# Patient Record
Sex: Male | Born: 1950 | Race: White | Hispanic: No | Marital: Married | Smoking: Never smoker
Health system: Southern US, Community
[De-identification: ages and names within clinical notes are randomized; demographics above are authoritative.]

## PROBLEM LIST (undated history)

## (undated) DIAGNOSIS — R339 Retention of urine, unspecified: Secondary | ICD-10-CM

## (undated) DIAGNOSIS — Z87442 Personal history of urinary calculi: Secondary | ICD-10-CM

## (undated) DIAGNOSIS — Z8679 Personal history of other diseases of the circulatory system: Secondary | ICD-10-CM

## (undated) DIAGNOSIS — N400 Enlarged prostate without lower urinary tract symptoms: Secondary | ICD-10-CM

## (undated) DIAGNOSIS — T7840XA Allergy, unspecified, initial encounter: Secondary | ICD-10-CM

## (undated) DIAGNOSIS — N390 Urinary tract infection, site not specified: Secondary | ICD-10-CM

## (undated) DIAGNOSIS — M1712 Unilateral primary osteoarthritis, left knee: Secondary | ICD-10-CM

## (undated) DIAGNOSIS — E039 Hypothyroidism, unspecified: Secondary | ICD-10-CM

## (undated) DIAGNOSIS — R011 Cardiac murmur, unspecified: Secondary | ICD-10-CM

## (undated) DIAGNOSIS — G4733 Obstructive sleep apnea (adult) (pediatric): Secondary | ICD-10-CM

## (undated) DIAGNOSIS — E079 Disorder of thyroid, unspecified: Secondary | ICD-10-CM

## (undated) DIAGNOSIS — E119 Type 2 diabetes mellitus without complications: Secondary | ICD-10-CM

## (undated) DIAGNOSIS — R7303 Prediabetes: Secondary | ICD-10-CM

## (undated) DIAGNOSIS — G4730 Sleep apnea, unspecified: Secondary | ICD-10-CM

## (undated) DIAGNOSIS — I10 Essential (primary) hypertension: Secondary | ICD-10-CM

## (undated) HISTORY — DX: Sleep apnea, unspecified: G47.30

## (undated) HISTORY — DX: Prediabetes: R73.03

## (undated) HISTORY — DX: Disorder of thyroid, unspecified: E07.9

## (undated) HISTORY — DX: Essential (primary) hypertension: I10

## (undated) HISTORY — PX: HERNIA REPAIR: SHX51

## (undated) HISTORY — DX: Type 2 diabetes mellitus without complications: E11.9

## (undated) HISTORY — DX: Allergy, unspecified, initial encounter: T78.40XA

---

## 1991-12-28 HISTORY — PX: KNEE SURGERY: SHX244

## 2006-06-01 ENCOUNTER — Ambulatory Visit: Payer: Self-pay | Admitting: Otolaryngology

## 2006-07-06 ENCOUNTER — Ambulatory Visit: Payer: Self-pay | Admitting: Internal Medicine

## 2008-04-12 ENCOUNTER — Ambulatory Visit: Payer: Self-pay | Admitting: Unknown Physician Specialty

## 2008-11-26 ENCOUNTER — Ambulatory Visit: Payer: Self-pay | Admitting: Oncology

## 2008-12-24 ENCOUNTER — Ambulatory Visit: Payer: Self-pay | Admitting: Oncology

## 2008-12-27 ENCOUNTER — Ambulatory Visit: Payer: Self-pay | Admitting: Oncology

## 2010-07-13 ENCOUNTER — Ambulatory Visit: Payer: Self-pay | Admitting: Otolaryngology

## 2010-07-21 ENCOUNTER — Ambulatory Visit: Payer: Self-pay | Admitting: Otolaryngology

## 2010-07-22 ENCOUNTER — Ambulatory Visit: Payer: Self-pay | Admitting: Otolaryngology

## 2010-08-11 ENCOUNTER — Ambulatory Visit: Payer: Self-pay | Admitting: Otolaryngology

## 2010-08-20 ENCOUNTER — Ambulatory Visit: Payer: Self-pay | Admitting: Otolaryngology

## 2011-04-06 ENCOUNTER — Ambulatory Visit: Payer: Self-pay | Admitting: Otolaryngology

## 2013-05-25 ENCOUNTER — Ambulatory Visit: Payer: Self-pay | Admitting: Unknown Physician Specialty

## 2013-12-27 HISTORY — PX: THYROID SURGERY: SHX805

## 2014-02-25 DIAGNOSIS — D239 Other benign neoplasm of skin, unspecified: Secondary | ICD-10-CM

## 2014-02-25 HISTORY — DX: Other benign neoplasm of skin, unspecified: D23.9

## 2014-05-10 ENCOUNTER — Ambulatory Visit: Payer: Self-pay | Admitting: Otolaryngology

## 2014-07-15 ENCOUNTER — Ambulatory Visit: Payer: Self-pay | Admitting: Otolaryngology

## 2014-07-15 HISTORY — PX: TOTAL THYROIDECTOMY: SHX2547

## 2014-07-16 LAB — CALCIUM: CALCIUM: 8.6 mg/dL (ref 8.5–10.1)

## 2014-07-18 LAB — PATHOLOGY REPORT

## 2014-08-21 IMAGING — US THYROID ULTRASOUND
2 series · 14 of 25 positions shown · non-contrast
Comparison: 04/06/2011 and earlier studies

CLINICAL DATA: Thyroid nodule

EXAM:
THYROID ULTRASOUND
TECHNIQUE: Ultrasound examination of the thyroid gland and adjacent soft
tissues was performed.

[Series 1: thyroid ultrasound · 0.08mm/px · 13 of 67 slices shown (1 of 2)]
[im 1/67]
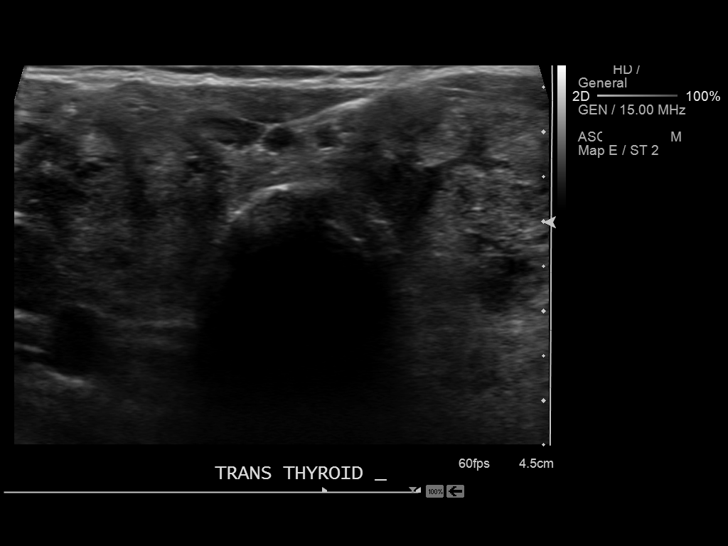
[im 6/67]
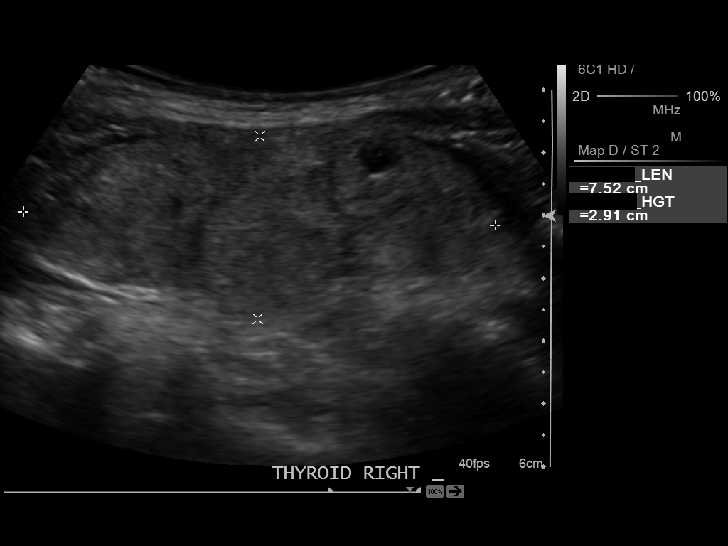
[im 12/67]
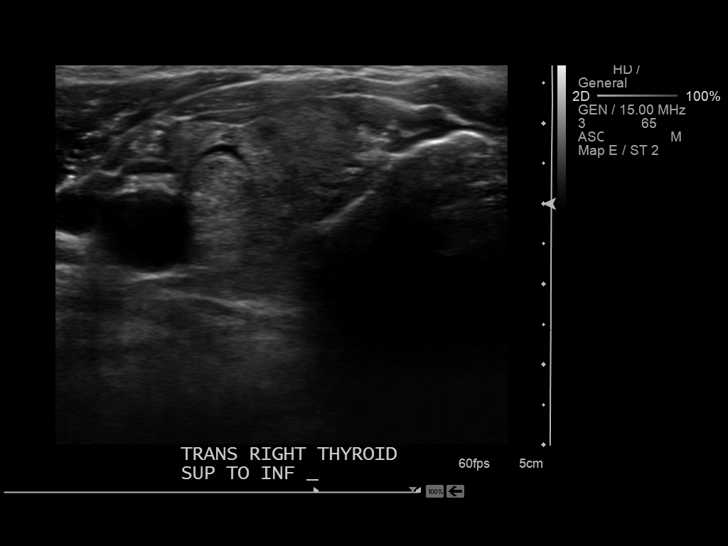
[im 18/67]
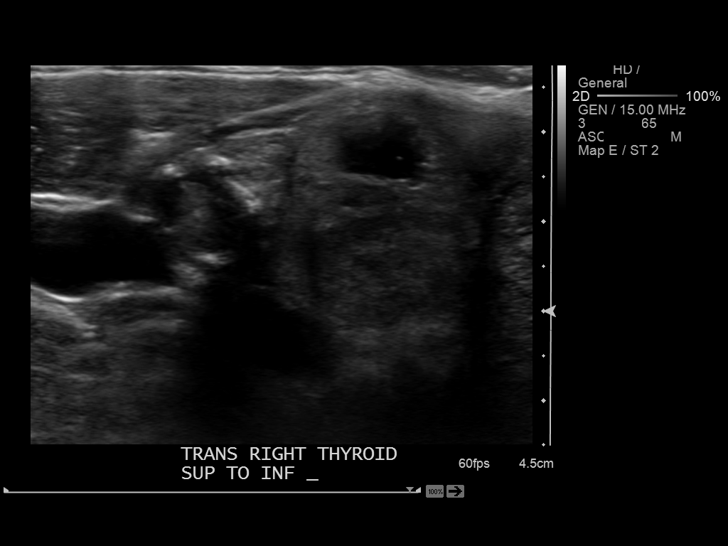
[im 23/67]
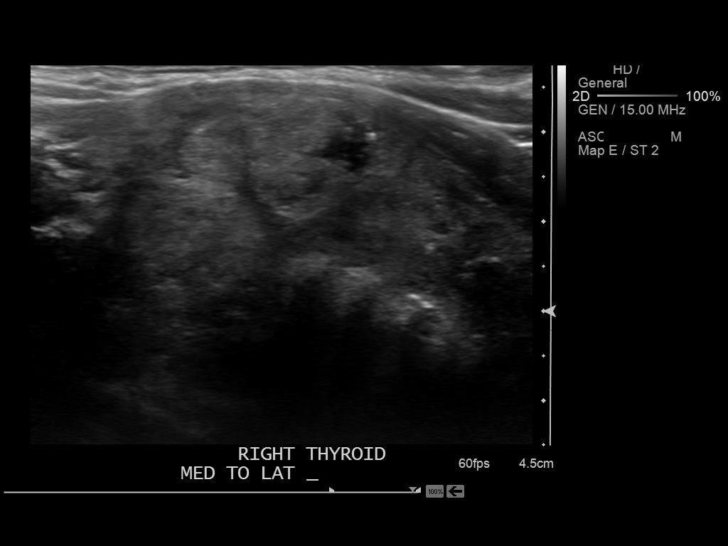
[im 26/67]
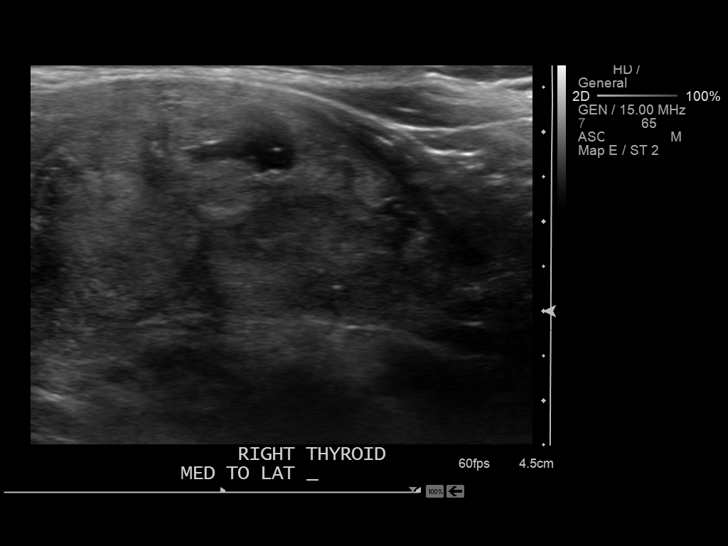
[im 32/67]
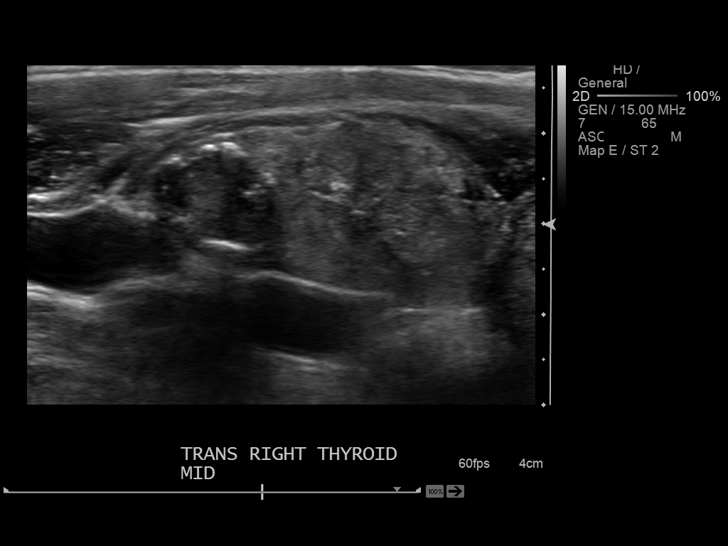
[im 38/67]
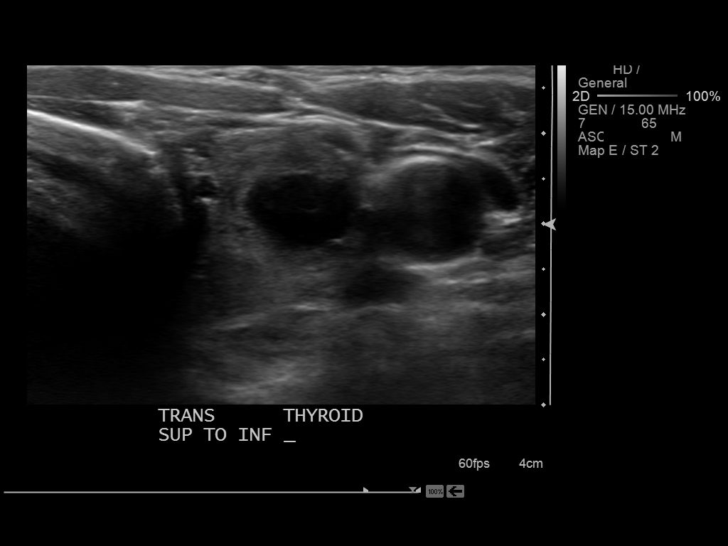
[im 44/67]
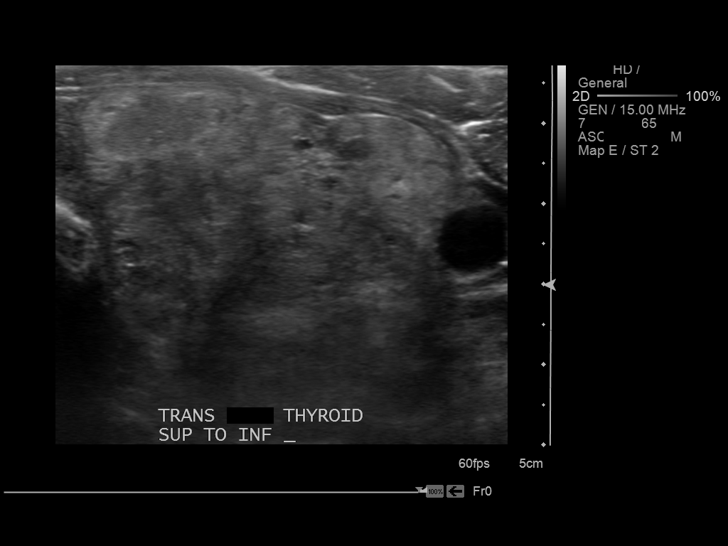
[im 46/67]
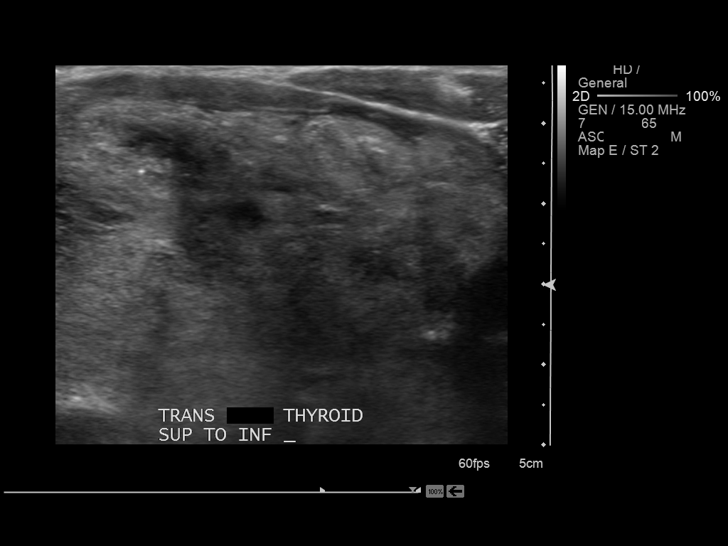
[im 52/67]
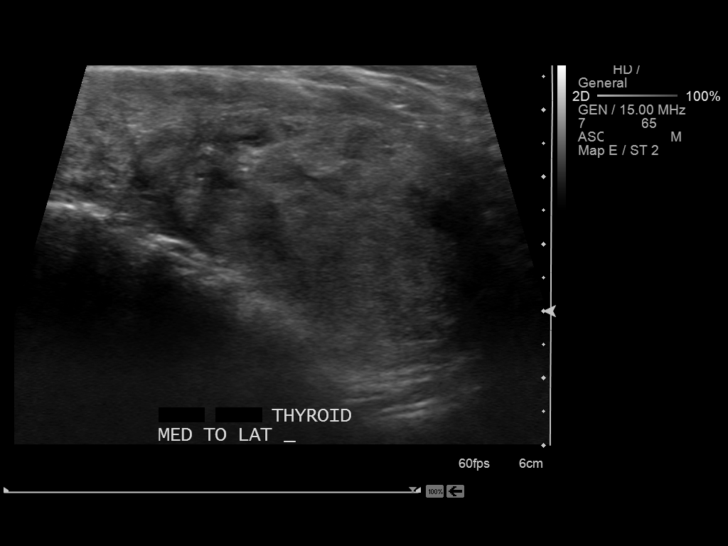
[im 58/67]
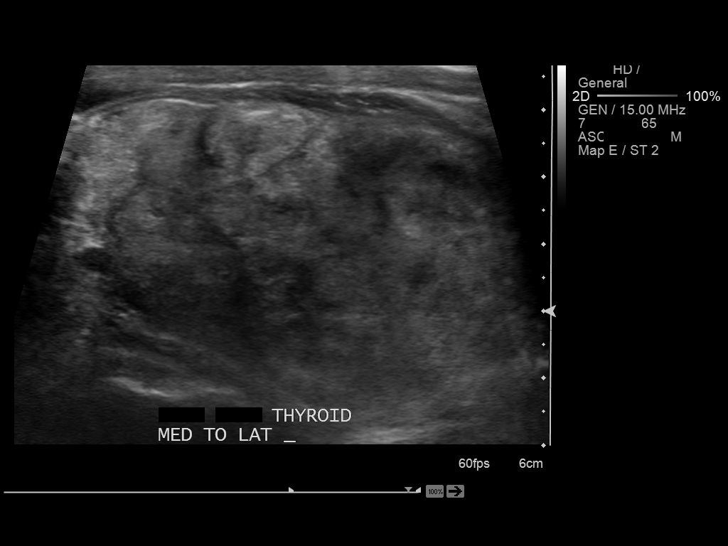
[im 64/67]
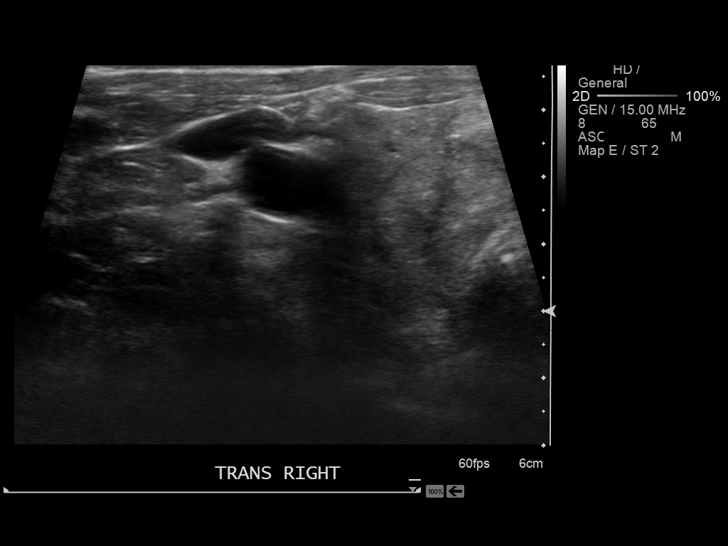

[Series 2: thyroid ultrasound · 0.08mm/px · 1 of 1 slices shown (2 of 2)]
[im 1/1]
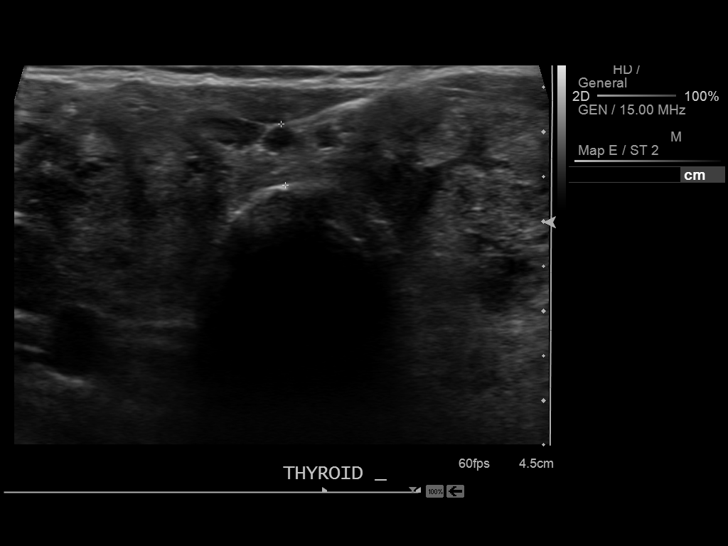

[14 of 25 positions shown; findings below may reference images not displayed]

FINDINGS: Right thyroid lobe

Measurements: 75 x 29 x 31 mm. Heterogeneous nodular echotexture.
There is a peripherally calcified 15 x 12 x 16 mm nodule, mid lobe
(previously 13 x 15 x 11).

Left thyroid lobe

Measurements: 93 x 41 x 45 mm. Heterogeneous echotexture without
discrete nodule. Focal mid lobe calcification.

Isthmus

Thickness: 7 mm.  No nodules visualized.

Lymphadenopathy

None visualized.
IMPRESSION: Thyromegaly with some increase in size of Single discrete nodule on
the right. Findings meet consensus criteria for biopsy.
Ultrasound-guided fine needle aspiration should be considered, as
per the consensus statement: Management of Thyroid Nodules Detected
at US: Society of Radiologists in Ultrasound Consensus Conference

## 2014-11-08 ENCOUNTER — Emergency Department: Payer: Self-pay | Admitting: Emergency Medicine

## 2014-11-08 LAB — BASIC METABOLIC PANEL
Anion Gap: 11 (ref 7–16)
BUN: 18 mg/dL (ref 7–18)
CREATININE: 1.01 mg/dL (ref 0.60–1.30)
Calcium, Total: 8.5 mg/dL (ref 8.5–10.1)
Chloride: 104 mmol/L (ref 98–107)
Co2: 26 mmol/L (ref 21–32)
EGFR (African American): >60
EGFR (Non-African Amer.): >60
GLUCOSE: 141 mg/dL — AB (ref 65–99)
Osmolality: 286 (ref 275–301)
Potassium: 3.7 mmol/L (ref 3.5–5.1)
SODIUM: 141 mmol/L (ref 136–145)

## 2014-11-08 LAB — CBC
HCT: 48 % (ref 40.0–52.0)
HGB: 16.4 g/dL (ref 13.0–18.0)
MCH: 31.6 pg (ref 26.0–34.0)
MCHC: 34.2 g/dL (ref 32.0–36.0)
MCV: 92 fL (ref 80–100)
PLATELETS: 204 10*3/uL (ref 150–440)
RBC: 5.2 10*6/uL (ref 4.40–5.90)
RDW: 13.6 % (ref 11.5–14.5)
WBC: 14.9 10*3/uL — ABNORMAL HIGH (ref 3.8–10.6)

## 2015-04-19 NOTE — Op Note (Signed)
PATIENT NAME:  Alan Holt, Alan Holt MR#:  [id number] DATE OF BIRTH:  [birth year]  DATE OF PROCEDURE:  07/15/2014  PREOPERATIVE DIAGNOSIS: Multiple enlarging thyroid nodules bilaterally.   POSTOPERATIVE DIAGNOSIS: Multiple enlarging thyroid nodules bilaterally.  OPERATIVE PROCEDURE: Total thyroidectomy.   SURGEON: Margaretha Sheffield, MD  ASSISTANT: Carloyn Manner, MD  ANESTHESIA: General.  COMPLICATIONS: None.   TOTAL ESTIMATED BLOOD LOSS: 100.  DESCRIPTION OF PROCEDURE: The patient was given general anesthesia by oral endotracheal intubation. A special oral endotracheal tube was used for monitoring of the laryngeal muscles during surgery. This was placed under direct visualization. A shoulder roll was placed and the neck was extended some. The patient had an upper crease and a crease down at the clavicles. The midportion was marked and 8 mL of 1% Xylocaine with epi 1:100,000 was placed into the subcutaneous tissues. The skin was prepped and draped in sterile fashion.   Incision was created following the skin crease line and then the skin and subcu were incised. The platysma was incised as well. Bleeding was controlled with electrocautery. The strap muscles were divided in the midline and elevated over the left thyroid gland, which was markedly enlarged. There appeared to be a very large nodule here that had a smooth capsule and was not very adherent. This was freed up mostly bluntly. There were some inferior vessels that were freed as well. Superiorly the attachments were starting to free up and then you could see what appeared to be more of a nodular gland further beneath this one big nodule that had further anterior attachments. Anterior attachments were cut with the Harmonic removing the superior thyroid vessels. It appeared as though there was a parathyroid gland here that I tried to free up from the gland and leave in the bed. Once this was freed up some the entire gland was delivered from the wound  and was approximately 5 inches in length. The inferior medial attachments were still there. Careful blunt dissection was done to find the recurrent laryngeal nerve. This was tracked up and attachments were freed up above it and below it to free up the gland from the trachea with sparing of the recurrent laryngeal nerve. The attachments to Berry's ligament were freed up and the entire gland was then freed up on the left side. There nerve was intact. There was no significant bleeding. Gauze was placed here temporarily while the right side was addressed.   Strap muscles were again elevated over the thyroid gland and lateral attachments were freed and there was a lateral vein on this side that was cut with the Harmonic. The remaining lateral and inferior attachments were freed up superiorly. The attachments were  freed as well. The superior thyroid vessels were cut across with the Harmonic also. The gland was then rotated medially to get underneath the recurrent laryngeal nerve and it was again found lying deep in the bed and the inferior attachments to the trachea were freed up. With the nerve in visualization the superior attachments and Berry's ligament were then cut across to free up the entire gland from the wound. The gland was tagged putting a small stitch at the right superior pole and a longer stitch at the left superior pole. This was sent to pathology in permanent solution.   The wounds were copiously irrigated. There was no significant bleeding on either side. The nerves were easily visualized and these were restimulated to show that there was good mobility. Surgicel was then cut in half and 1 piece  put over a bed on each side. Then 10 mm TLS drains were then placed bilaterally crisscrossing the midline. Once these were in the dural hooks were removed and the wound was allowed to close down. The shoulder roll was removed. The strap muscles were loosely placed together and then the platysmal layer was  closed with 4-0 Vicryls. The dermis and subcuticular was then closed with 4-0 Vicryl as well and then the skin edges were held in apposition using a 6-0 nylon in a running locking suture. The wound was covered with bacitracin, Telfa and then Tegaderm. The drains were placed to low continuous Vacutainer suction and were taped to the skin. The patient tolerated the procedure well. He was awakened and taken to the recovery room in satisfactory condition. There were no operative complications. Total estimated blood loss was 100 mL.   ____________________________ Huey Romans, MD phj:sb D: 07/15/2014 11:43:20 ET T: 07/15/2014 12:00:13 ET JOB#: [id number]  cc: Huey Romans, MD, <Dictator> Huey Romans MD ELECTRONICALLY SIGNED 07/18/2014 7:53

## 2015-05-28 DIAGNOSIS — E890 Postprocedural hypothyroidism: Secondary | ICD-10-CM | POA: Diagnosis not present

## 2015-07-09 ENCOUNTER — Encounter: Payer: Self-pay | Admitting: Family Medicine

## 2015-07-09 ENCOUNTER — Ambulatory Visit (INDEPENDENT_AMBULATORY_CARE_PROVIDER_SITE_OTHER): Payer: Medicare Other | Admitting: Family Medicine

## 2015-07-09 VITALS — BP 129/85 | HR 83 | Temp 98.4°F | Resp 18 | Ht 73.0 in | Wt 236.9 lb

## 2015-07-09 DIAGNOSIS — E039 Hypothyroidism, unspecified: Secondary | ICD-10-CM | POA: Insufficient documentation

## 2015-07-09 DIAGNOSIS — R748 Abnormal levels of other serum enzymes: Secondary | ICD-10-CM

## 2015-07-09 MED ORDER — NIACIN ER (ANTIHYPERLIPIDEMIC) 500 MG PO TBCR: 1500.0000 mg | EXTENDED_RELEASE_TABLET | Freq: Every day | ORAL | Status: DC

## 2015-07-09 NOTE — Progress Notes (Signed)
Name: Alan Holt   MRN: [id number]    DOB: 21-Apr-1951   Date:07/09/2015       Progress Note  Subjective  Chief Complaint  Chief Complaint  Patient presents with  . Establish Care    NP (Dr. Liana Gerold)  . Medication Refill    HPI Pt. Is here for follow up of low-HDL. He is on Niaspan 1500mg  daily. He has been on Niaspan for 16 years. Last HDL level in June 2015 was 38. Total cholesterol and LDL have been within normal limits. Pt. Has no history of heart disease.   Past Medical History  Diagnosis Date  . Allergy   . Sleep apnea   . Thyroid disease     Past Surgical History  Procedure Laterality Date  . Knee surgery Left 1993  . Thyroid surgery  2015  . Hernia repair      childhood  . Total thyroidectomy Bilateral 07/15/2014    Dr. Kathyrn Sheriff.     Family History  Problem Relation Age of Onset  . Diabetes Mother   . Alcohol abuse Father   . Stroke Father   . Diabetes Brother   . Heart attack Brother     History   Social History  . Marital Status: Married    Spouse Name: N/A  . Number of Children: N/A  . Years of Education: N/A   Occupational History  . Not on file.   Social History Main Topics  . Smoking status: Never Smoker   . Smokeless tobacco: Never Used  . Alcohol Use: No  . Drug Use: No  . Sexual Activity: No   Other Topics Concern  . Not on file   Social History Narrative  . No narrative on file     Current outpatient prescriptions:  .  ibuprofen (ADVIL,MOTRIN) 600 MG tablet, Take by mouth., Disp: , Rfl:  .  niacin (NIASPAN) 500 MG CR tablet, Take 3 tablets by mouth daily., Disp: , Rfl:  .  SYNTHROID 75 MCG tablet, , Disp: , Rfl:  .  SYNTHROID 88 MCG tablet, , Disp: , Rfl:  .  levothyroxine (SYNTHROID, LEVOTHROID) 175 MCG tablet, Take 175 mcg by mouth daily before breakfast., Disp: , Rfl:   Allergies  Allergen Reactions  . Erythromycin Rash     Review of Systems  Respiratory: Negative for shortness of breath.   Cardiovascular:  Negative for chest pain and palpitations.  Gastrointestinal: Negative for abdominal pain, diarrhea and constipation.  Musculoskeletal: Negative for myalgias, back pain and joint pain.      Objective  Filed Vitals:   07/09/15 1407  BP: 129/85  Pulse: 83  Temp: 98.4 F (36.9 C)  TempSrc: Oral  Resp: 18  Height: 6\' 1"  (1.854 m)  Weight: 236 lb 14.4 oz (107.457 kg)  SpO2: 96%    Physical Exam  Constitutional: He is oriented to person, place, and time and well-developed, well-nourished, and in no distress.  Cardiovascular: Normal rate and regular rhythm.   Pulmonary/Chest: Effort normal and breath sounds normal.  Abdominal: Soft. Bowel sounds are normal.  Neurological: He is alert and oriented to person, place, and time.  Skin: Skin is warm and dry.  Psychiatric: Affect normal.  Nursing note and vitals reviewed.    Assessment & Plan 1. Low serum HDL  - Lipid Profile - niacin (NIASPAN) 500 MG CR tablet; Take 3 tablets (1,500 mg total) by mouth at bedtime.  Dispense: 90 tablet; Refill: 5 - Basic Metabolic Panel (BMET) - Hepatic function  panel   Traevon Meiring Asad A. [facility] 07/09/2015 2:59 PM

## 2015-07-10 DIAGNOSIS — R748 Abnormal levels of other serum enzymes: Secondary | ICD-10-CM | POA: Diagnosis not present

## 2015-07-11 LAB — HEPATIC FUNCTION PANEL
ALK PHOS: 77 IU/L (ref 39–117)
ALT: 37 IU/L (ref 0–44)
AST: 29 IU/L (ref 0–40)
Albumin: 4.4 g/dL (ref 3.6–4.8)
Bilirubin Total: 0.8 mg/dL (ref 0.0–1.2)
Bilirubin, Direct: 0.18 mg/dL (ref 0.00–0.40)
Total Protein: 6.5 g/dL (ref 6.0–8.5)

## 2015-07-11 LAB — BASIC METABOLIC PANEL
BUN/Creatinine Ratio: 16 (ref 10–22)
BUN: 16 mg/dL (ref 8–27)
CALCIUM: 9.5 mg/dL (ref 8.6–10.2)
CHLORIDE: 103 mmol/L (ref 97–108)
CO2: 21 mmol/L (ref 18–29)
CREATININE: 0.99 mg/dL (ref 0.76–1.27)
GFR calc Af Amer: 92 mL/min/{1.73_m2} (ref >59)
GFR calc non Af Amer: 80 mL/min/{1.73_m2} (ref >59)
Glucose: 97 mg/dL (ref 65–99)
Potassium: 4.4 mmol/L (ref 3.5–5.2)
Sodium: 144 mmol/L (ref 134–144)

## 2015-07-11 LAB — LIPID PANEL
CHOL/HDL RATIO: 4.8 ratio (ref 0.0–5.0)
CHOLESTEROL TOTAL: 153 mg/dL (ref 100–199)
HDL: 32 mg/dL — AB (ref >39)
LDL Calculated: 62 mg/dL (ref 0–99)
TRIGLYCERIDES: 297 mg/dL — AB (ref 0–149)
VLDL CHOLESTEROL CAL: 59 mg/dL — AB (ref 5–40)

## 2015-07-16 ENCOUNTER — Encounter: Payer: Self-pay | Admitting: Family Medicine

## 2015-07-16 ENCOUNTER — Ambulatory Visit (INDEPENDENT_AMBULATORY_CARE_PROVIDER_SITE_OTHER): Payer: Medicare Other | Admitting: Family Medicine

## 2015-07-16 VITALS — BP 129/70 | HR 90 | Temp 98.0°F | Resp 17 | Ht 73.0 in | Wt 234.5 lb

## 2015-07-16 DIAGNOSIS — R748 Abnormal levels of other serum enzymes: Secondary | ICD-10-CM

## 2015-07-16 MED ORDER — NIACIN ER (ANTIHYPERLIPIDEMIC) 500 MG PO TBCR: 1500.0000 mg | EXTENDED_RELEASE_TABLET | Freq: Every day | ORAL | Status: DC

## 2015-07-16 NOTE — Progress Notes (Signed)
Name: Alan Holt   MRN: [id number]    DOB: 10/21/1951   Date:07/16/2015       Progress Note  Subjective  Chief Complaint  Chief Complaint  Patient presents with  . Follow-up    Discuss Labs  . Medication Refill    niacin    Hyperlipidemia This is a recurrent problem. Recent lipid tests were reviewed and are high (TG elevated, HDL is below normal.). Pertinent negatives include no chest pain or shortness of breath. Current antihyperlipidemic treatment includes diet change and exercise. Risk factors for coronary artery disease include dyslipidemia, male sex, family history and stress.     Past Medical History  Diagnosis Date  . Allergy   . Sleep apnea   . Thyroid disease     Past Surgical History  Procedure Laterality Date  . Knee surgery Left 1993  . Thyroid surgery  2015  . Hernia repair      childhood  . Total thyroidectomy Bilateral 07/15/2014    Dr. Kathyrn Sheriff.     Family History  Problem Relation Age of Onset  . Diabetes Mother   . Alcohol abuse Father   . Stroke Father   . Diabetes Brother   . Heart attack Brother     History   Social History  . Marital Status: Married    Spouse Name: N/A  . Number of Children: N/A  . Years of Education: N/A   Occupational History  . Not on file.   Social History Main Topics  . Smoking status: Never Smoker   . Smokeless tobacco: Never Used  . Alcohol Use: No  . Drug Use: No  . Sexual Activity: No   Other Topics Concern  . Not on file   Social History Narrative     Current outpatient prescriptions:  .  aspirin 81 MG tablet, Take 81 mg by mouth daily., Disp: , Rfl:  .  niacin (NIASPAN) 500 MG CR tablet, Take 3 tablets (1,500 mg total) by mouth at bedtime., Disp: 90 tablet, Rfl: 5 .  SYNTHROID 75 MCG tablet, , Disp: , Rfl:  .  SYNTHROID 88 MCG tablet, , Disp: , Rfl:  .  ibuprofen (ADVIL,MOTRIN) 600 MG tablet, Take by mouth., Disp: , Rfl:   Allergies  Allergen Reactions  . Erythromycin Rash     Review  of Systems  Respiratory: Negative for shortness of breath.   Cardiovascular: Negative for chest pain.      Objective  Filed Vitals:   07/16/15 1507  BP: 129/70  Pulse: 90  Temp: 98 F (36.7 C)  TempSrc: Oral  Resp: 17  Height: 6\' 1"  (1.854 m)  Weight: 234 lb 8 oz (106.369 kg)  SpO2: 98%    Physical Exam  Constitutional: He is well-developed, well-nourished, and in no distress.  Nursing note and vitals reviewed.      Recent Results (from the past 2160 hour(s))  Basic Metabolic Panel (BMET)     Status: None   Collection Time: 07/10/15  8:32 AM  Result Value Ref Range   Glucose 97 65 - 99 mg/dL    Comment: Specimen received in contact with cells. No visible hemolysis present. However GLUC may be decreased and K increased. Clinical correlation indicated.    BUN 16 8 - 27 mg/dL   Creatinine, Ser 0.99 0.76 - 1.27 mg/dL   GFR calc non Af Amer 80 >59 mL/min/1.73   GFR calc Af Amer 92 >59 mL/min/1.73   BUN/Creatinine Ratio 16 10 - 22  Sodium 144 134 - 144 mmol/L   Potassium 4.4 3.5 - 5.2 mmol/L   Chloride 103 97 - 108 mmol/L   CO2 21 18 - 29 mmol/L   Calcium 9.5 8.6 - 10.2 mg/dL  Hepatic function panel     Status: None   Collection Time: 07/10/15  8:32 AM  Result Value Ref Range   Total Protein 6.5 6.0 - 8.5 g/dL   Albumin 4.4 3.6 - 4.8 g/dL   Bilirubin Total 0.8 0.0 - 1.2 mg/dL   Bilirubin, Direct 0.18 0.00 - 0.40 mg/dL   Alkaline Phosphatase 77 39 - 117 IU/L   AST 29 0 - 40 IU/L   ALT 37 0 - 44 IU/L  Lipid panel     Status: Abnormal   Collection Time: 07/10/15  8:32 AM  Result Value Ref Range   Cholesterol, Total 153 100 - 199 mg/dL   Triglycerides 297 (H) 0 - 149 mg/dL   HDL 32 (L) >39 mg/dL    Comment: According to ATP-III Guidelines, HDL-C >59 mg/dL is considered a negative risk factor for CHD.    VLDL Cholesterol Cal 59 (H) 5 - 40 mg/dL   LDL Calculated 62 0 - 99 mg/dL   Chol/HDL Ratio 4.8 0.0 - 5.0 ratio units    Comment:                                    T. Chol/HDL Ratio                                             Men  Women                               1/2 Avg.Risk  3.4    3.3                                   Avg.Risk  5.0    4.4                                2X Avg.Risk  9.6    7.1                                3X Avg.Risk 23.4   11.0      Assessment & Plan 1. Low serum HDL - niacin (NIASPAN) 500 MG CR tablet; Take 3 tablets (1,500 mg total) by mouth at bedtime.  Dispense: 270 tablet; Refill: 0 Pt. Does not wish to be started on a statin at this time. He will work on dietary and lifestyle therapy and recheck in 3 months  Annel Zunker Asad A. [facility] 07/16/2015 3:28 PM

## 2015-12-04 DIAGNOSIS — E890 Postprocedural hypothyroidism: Secondary | ICD-10-CM | POA: Diagnosis not present

## 2015-12-09 DIAGNOSIS — E890 Postprocedural hypothyroidism: Secondary | ICD-10-CM | POA: Diagnosis not present

## 2016-01-07 ENCOUNTER — Telehealth: Payer: Self-pay | Admitting: Family Medicine

## 2016-01-07 NOTE — Telephone Encounter (Signed)
Patient has appointment for this coming Monday and would like to pick up a lab order in the morning. He would like to order cholesterol, triglycerides and he said check his chart for any other test that dr Manuella Ghazi normally order

## 2016-01-08 ENCOUNTER — Other Ambulatory Visit: Payer: Self-pay | Admitting: Family Medicine

## 2016-01-08 DIAGNOSIS — E785 Hyperlipidemia, unspecified: Secondary | ICD-10-CM

## 2016-01-08 NOTE — Telephone Encounter (Signed)
Routed to Dr. Shah for advice  

## 2016-01-08 NOTE — Telephone Encounter (Signed)
Pt. Will need fasting Lipid Panel and Comprehensive Metabolic Panel.

## 2016-01-12 ENCOUNTER — Encounter: Payer: Self-pay | Admitting: Family Medicine

## 2016-01-12 ENCOUNTER — Ambulatory Visit (INDEPENDENT_AMBULATORY_CARE_PROVIDER_SITE_OTHER): Payer: Medicare Other | Admitting: Family Medicine

## 2016-01-12 VITALS — BP 128/71 | HR 86 | Temp 98.6°F | Resp 18 | Ht 73.0 in | Wt 233.2 lb

## 2016-01-12 DIAGNOSIS — E785 Hyperlipidemia, unspecified: Secondary | ICD-10-CM | POA: Diagnosis not present

## 2016-01-12 DIAGNOSIS — E781 Pure hyperglyceridemia: Secondary | ICD-10-CM

## 2016-01-12 DIAGNOSIS — R748 Abnormal levels of other serum enzymes: Secondary | ICD-10-CM

## 2016-01-12 MED ORDER — NIACIN ER (ANTIHYPERLIPIDEMIC) 500 MG PO TBCR: 1500.0000 mg | EXTENDED_RELEASE_TABLET | Freq: Every day | ORAL | Status: DC

## 2016-01-12 NOTE — Progress Notes (Signed)
Name: Alan Holt   MRN: [id number]    DOB: [birth year]   Date:01/12/2016       Progress Note  Subjective  Chief Complaint  Chief Complaint  Patient presents with  . Follow-up    6 mo    Hyperlipidemia This is a chronic problem. The problem is uncontrolled. Recent lipid tests were reviewed and are low (HDL below normal, TG above normal). Pertinent negatives include no chest pain, leg pain, myalgias or shortness of breath. Current antihyperlipidemic treatment includes nicotinic acid. The current treatment provides moderate improvement of lipids.    Past Medical History  Diagnosis Date  . Allergy   . Sleep apnea   . Thyroid disease     Past Surgical History  Procedure Laterality Date  . Knee surgery Left 1993  . Thyroid surgery  2015  . Hernia repair      childhood  . Total thyroidectomy Bilateral 07/15/2014    Dr. Kathyrn Sheriff.     Family History  Problem Relation Age of Onset  . Diabetes Mother   . Alcohol abuse Father   . Stroke Father   . Diabetes Brother   . Heart attack Brother     Social History   Social History  . Marital Status: Married    Spouse Name: N/A  . Number of Children: N/A  . Years of Education: N/A   Occupational History  . Not on file.   Social History Main Topics  . Smoking status: Never Smoker   . Smokeless tobacco: Never Used  . Alcohol Use: No  . Drug Use: No  . Sexual Activity: No   Other Topics Concern  . Not on file   Social History Narrative     Current outpatient prescriptions:  .  aspirin 81 MG tablet, Take 81 mg by mouth daily., Disp: , Rfl:  .  ibuprofen (ADVIL,MOTRIN) 600 MG tablet, Take by mouth., Disp: , Rfl:  .  niacin (NIASPAN) 500 MG CR tablet, Take 3 tablets (1,500 mg total) by mouth at bedtime., Disp: 270 tablet, Rfl: 0 .  SYNTHROID 75 MCG tablet, , Disp: , Rfl:  .  SYNTHROID 88 MCG tablet, , Disp: , Rfl:   Allergies  Allergen Reactions  . Erythromycin Rash     Review of Systems  Respiratory: Negative  for shortness of breath.   Cardiovascular: Negative for chest pain.  Musculoskeletal: Negative for myalgias.     Objective  Filed Vitals:   01/12/16 0809  BP: 128/71  Pulse: 86  Temp: 98.6 F (37 C)  TempSrc: Oral  Resp: 18  Height: 6\' 1"  (1.854 m)  Weight: 233 lb 3.2 oz (105.779 kg)  SpO2: 98%    Physical Exam  Constitutional: He is oriented to person, place, and time and well-developed, well-nourished, and in no distress.  Cardiovascular: Normal rate, regular rhythm and normal heart sounds.   Pulmonary/Chest: Effort normal and breath sounds normal.  Abdominal: Soft. Bowel sounds are normal.  Neurological: He is alert and oriented to person, place, and time.  Nursing note and vitals reviewed.     Assessment & Plan  1. Low serum HDL  - niacin (NIASPAN) 500 MG CR tablet; Take 3 tablets (1,500 mg total) by mouth at bedtime.  Dispense: 270 tablet; Refill: 0  2. Hypertriglyceridemia Repeat FLP today and consider medication adjustment.   Halsey Hammen Asad A. [facility] 01/12/2016 8:23 AM

## 2016-01-13 LAB — LIPID PANEL
CHOLESTEROL TOTAL: 151 mg/dL (ref 100–199)
Chol/HDL Ratio: 4.1 ratio units (ref 0.0–5.0)
HDL: 37 mg/dL — AB (ref >39)
LDL Calculated: 78 mg/dL (ref 0–99)
TRIGLYCERIDES: 180 mg/dL — AB (ref 0–149)
VLDL Cholesterol Cal: 36 mg/dL (ref 5–40)

## 2016-01-13 LAB — COMPREHENSIVE METABOLIC PANEL
ALBUMIN: 4.4 g/dL (ref 3.6–4.8)
ALK PHOS: 77 IU/L (ref 39–117)
ALT: 37 IU/L (ref 0–44)
AST: 28 IU/L (ref 0–40)
Albumin/Globulin Ratio: 2 (ref 1.1–2.5)
BUN/Creatinine Ratio: 17 (ref 10–22)
BUN: 20 mg/dL (ref 8–27)
Bilirubin Total: 0.6 mg/dL (ref 0.0–1.2)
CHLORIDE: 104 mmol/L (ref 96–106)
CO2: 24 mmol/L (ref 18–29)
CREATININE: 1.2 mg/dL (ref 0.76–1.27)
Calcium: 9.4 mg/dL (ref 8.6–10.2)
GFR calc non Af Amer: 63 mL/min/{1.73_m2} (ref >59)
GFR, EST AFRICAN AMERICAN: 73 mL/min/{1.73_m2} (ref >59)
GLUCOSE: 100 mg/dL — AB (ref 65–99)
Globulin, Total: 2.2 g/dL (ref 1.5–4.5)
POTASSIUM: 4.3 mmol/L (ref 3.5–5.2)
SODIUM: 143 mmol/L (ref 134–144)
Total Protein: 6.6 g/dL (ref 6.0–8.5)

## 2016-03-01 ENCOUNTER — Other Ambulatory Visit: Payer: Self-pay | Admitting: Family Medicine

## 2016-03-02 NOTE — Telephone Encounter (Signed)
Medication has been refilled and sent to [facility] °

## 2016-05-02 ENCOUNTER — Other Ambulatory Visit: Payer: Self-pay | Admitting: Family Medicine

## 2016-06-09 DIAGNOSIS — E890 Postprocedural hypothyroidism: Secondary | ICD-10-CM | POA: Diagnosis not present

## 2016-06-18 ENCOUNTER — Ambulatory Visit (INDEPENDENT_AMBULATORY_CARE_PROVIDER_SITE_OTHER): Payer: Medicare Other | Admitting: Family Medicine

## 2016-06-18 ENCOUNTER — Encounter: Payer: Self-pay | Admitting: Family Medicine

## 2016-06-18 VITALS — BP 128/76 | HR 105 | Temp 98.3°F | Resp 17 | Ht 73.0 in | Wt 239.7 lb

## 2016-06-18 DIAGNOSIS — R198 Other specified symptoms and signs involving the digestive system and abdomen: Secondary | ICD-10-CM

## 2016-06-18 NOTE — Progress Notes (Signed)
Name: Alan Holt   MRN: [id number]    DOB: 1951/12/22   Date:06/18/2016       Progress Note  Subjective  Chief Complaint  Chief Complaint  Patient presents with  . Acute Visit    Sline enlarged; fullness under rib cage     HPI  Pt. Presents for evaluation of possible enlarged spleen, started feeling fullness underneath the left rib cage 4 weeks ago, feels heaviness in the area. No pain, fever, chills, weight loss, fatigue. Area of greatest concern in just below the rib cage, sitting in one position makes it feel worse (driving and sitting in one position for a long period of time).  Past Medical History  Diagnosis Date  . Allergy   . Sleep apnea   . Thyroid disease     Past Surgical History  Procedure Laterality Date  . Knee surgery Left 1993  . Thyroid surgery  2015  . Hernia repair      childhood  . Total thyroidectomy Bilateral 07/15/2014    Dr. Kathyrn Sheriff.     Family History  Problem Relation Age of Onset  . Diabetes Mother   . Alcohol abuse Father   . Stroke Father   . Diabetes Brother   . Heart attack Brother     Social History   Social History  . Marital Status: Married    Spouse Name: N/A  . Number of Children: N/A  . Years of Education: N/A   Occupational History  . Not on file.   Social History Main Topics  . Smoking status: Never Smoker   . Smokeless tobacco: Never Used  . Alcohol Use: No  . Drug Use: No  . Sexual Activity: No   Other Topics Concern  . Not on file   Social History Narrative     Current outpatient prescriptions:  .  aspirin 81 MG tablet, Take 81 mg by mouth daily., Disp: , Rfl:  .  ibuprofen (ADVIL,MOTRIN) 600 MG tablet, Take by mouth., Disp: , Rfl:  .  niacin (NIASPAN) 500 MG CR tablet, Take 3 tablets by mouth at  bedtime, Disp: 270 tablet, Rfl: 3 .  SYNTHROID 75 MCG tablet, , Disp: , Rfl:  .  SYNTHROID 88 MCG tablet, , Disp: , Rfl:   Allergies  Allergen Reactions  . Erythromycin Rash    Review of Systems   Constitutional: Negative for fever, chills and malaise/fatigue.  Gastrointestinal: Negative for nausea, vomiting, abdominal pain and blood in stool.  Genitourinary: Negative for hematuria.    Objective  Filed Vitals:   06/18/16 1016  BP: 128/76  Pulse: 105  Temp: 98.3 F (36.8 C)  TempSrc: Oral  Resp: 17  Height: 6\' 1"  (1.854 m)  Weight: 239 lb 11.2 oz (108.727 kg)  SpO2: 96%    Physical Exam  Constitutional: He is oriented to person, place, and time and well-developed, well-nourished, and in no distress.  Cardiovascular: Normal rate, regular rhythm, S1 normal and S2 normal.   No murmur heard. Pulmonary/Chest: Effort normal. No respiratory distress. He has no decreased breath sounds. He has no rales.  Abdominal: Soft. Normal appearance and bowel sounds are normal. There is no splenomegaly. There is no tenderness.  Neurological: He is alert and oriented to person, place, and time.  Nursing note and vitals reviewed.    Assessment & Plan  1. LUQ fullness Unclear etiology, we'll obtain CBC and left upper quadrant ultrasound to evaluate for possible splenomegaly. - CBC with Differential - US Abdomen Limited;  Future   Babak Lucus Asad A. [facility] 06/18/2016 10:24 AM

## 2016-06-19 LAB — CBC WITH DIFFERENTIAL/PLATELET
BASOS: 0 %
Basophils Absolute: 0 10*3/uL (ref 0.0–0.2)
EOS (ABSOLUTE): 0.1 10*3/uL (ref 0.0–0.4)
EOS: 1 %
HEMATOCRIT: 45.3 % (ref 37.5–51.0)
Hemoglobin: 15.9 g/dL (ref 12.6–17.7)
Immature Grans (Abs): 0 10*3/uL (ref 0.0–0.1)
Immature Granulocytes: 0 %
LYMPHS ABS: 1.9 10*3/uL (ref 0.7–3.1)
Lymphs: 26 %
MCH: 31.7 pg (ref 26.6–33.0)
MCHC: 35.1 g/dL (ref 31.5–35.7)
MCV: 90 fL (ref 79–97)
MONOCYTES: 7 %
MONOS ABS: 0.5 10*3/uL (ref 0.1–0.9)
NEUTROS ABS: 4.8 10*3/uL (ref 1.4–7.0)
Neutrophils: 66 %
Platelets: 235 10*3/uL (ref 150–379)
RBC: 5.01 x10E6/uL (ref 4.14–5.80)
RDW: 13.5 % (ref 12.3–15.4)
WBC: 7.3 10*3/uL (ref 3.4–10.8)

## 2016-06-21 ENCOUNTER — Ambulatory Visit
Admission: RE | Admit: 2016-06-21 | Discharge: 2016-06-21 | Disposition: A | Discharge status: Home / Self Care | Payer: Medicare Other | Source: Ambulatory Visit | Attending: Family Medicine | Admitting: Family Medicine

## 2016-06-21 DIAGNOSIS — R198 Other specified symptoms and signs involving the digestive system and abdomen: Secondary | ICD-10-CM | POA: Diagnosis not present

## 2016-06-22 DIAGNOSIS — E890 Postprocedural hypothyroidism: Secondary | ICD-10-CM | POA: Diagnosis not present

## 2016-07-12 ENCOUNTER — Ambulatory Visit: Payer: Medicare Other | Admitting: Family Medicine

## 2016-07-13 ENCOUNTER — Ambulatory Visit: Payer: Medicare Other | Admitting: Family Medicine

## 2016-07-20 ENCOUNTER — Encounter: Payer: Self-pay | Admitting: Family Medicine

## 2016-07-20 ENCOUNTER — Ambulatory Visit (INDEPENDENT_AMBULATORY_CARE_PROVIDER_SITE_OTHER): Payer: Medicare Other | Admitting: Family Medicine

## 2016-07-20 VITALS — BP 124/82 | HR 97 | Temp 98.6°F | Resp 18 | Ht 73.0 in | Wt 233.8 lb

## 2016-07-20 DIAGNOSIS — R7989 Other specified abnormal findings of blood chemistry: Secondary | ICD-10-CM | POA: Insufficient documentation

## 2016-07-20 DIAGNOSIS — E291 Testicular hypofunction: Secondary | ICD-10-CM | POA: Diagnosis not present

## 2016-07-20 DIAGNOSIS — R748 Abnormal levels of other serum enzymes: Secondary | ICD-10-CM | POA: Diagnosis not present

## 2016-07-20 DIAGNOSIS — E781 Pure hyperglyceridemia: Secondary | ICD-10-CM

## 2016-07-20 LAB — LIPID PANEL
CHOLESTEROL: 155 mg/dL (ref 125–200)
HDL: 38 mg/dL — ABNORMAL LOW (ref >=40)
LDL Cholesterol: 68 mg/dL (ref <130)
TRIGLYCERIDES: 245 mg/dL — AB (ref <150)
Total CHOL/HDL Ratio: 4.1 Ratio (ref <=5.0)
VLDL: 49 mg/dL — AB (ref <30)

## 2016-07-20 LAB — COMPLETE METABOLIC PANEL WITH GFR
ALBUMIN: 4.8 g/dL (ref 3.6–5.1)
ALK PHOS: 80 U/L (ref 40–115)
ALT: 37 U/L (ref 9–46)
AST: 28 U/L (ref 10–35)
BUN: 20 mg/dL (ref 7–25)
CALCIUM: 9.9 mg/dL (ref 8.6–10.3)
CHLORIDE: 107 mmol/L (ref 98–110)
CO2: 25 mmol/L (ref 20–31)
CREATININE: 0.99 mg/dL (ref 0.70–1.25)
GFR, Est Non African American: 79 mL/min (ref >=60)
Glucose, Bld: 110 mg/dL — ABNORMAL HIGH (ref 65–99)
POTASSIUM: 4.2 mmol/L (ref 3.5–5.3)
Sodium: 143 mmol/L (ref 135–146)
Total Bilirubin: 0.7 mg/dL (ref 0.2–1.2)
Total Protein: 7.4 g/dL (ref 6.1–8.1)

## 2016-07-20 MED ORDER — NIACIN ER (ANTIHYPERLIPIDEMIC) 500 MG PO TBCR: 1500.0000 mg | EXTENDED_RELEASE_TABLET | Freq: Every day | ORAL | 3 refills | Status: DC

## 2016-07-20 NOTE — Progress Notes (Signed)
Name: Alan Holt   MRN: [id number]    DOB: 31-May-1951   Date:07/20/2016       Progress Note  Subjective  Chief Complaint  Chief Complaint  Patient presents with  . Hyperlipidemia    follow up    Hyperlipidemia  This is a chronic problem. The problem is uncontrolled (elevated Triglycerides, low HDL, normal LDL.). Pertinent negatives include no chest pain, leg pain or myalgias. Current antihyperlipidemic treatment includes nicotinic acid.    History of Low Testosterone: Pt. Reports that his former PCP Dr. Jacqualine Code was checking his testosterone levels periodically and the levels were declining and reportedly below normal. He was never started on any replacement therapy, does not feel fatigued or loss of libido.    Past Medical History:  Diagnosis Date  . Allergy   . Sleep apnea   . Thyroid disease     Past Surgical History:  Procedure Laterality Date  . HERNIA REPAIR     childhood  . KNEE SURGERY Left 1993  . THYROID SURGERY  2015  . TOTAL THYROIDECTOMY Bilateral 07/15/2014   Dr. Kathyrn Sheriff.     Family History  Problem Relation Age of Onset  . Diabetes Mother   . Alcohol abuse Father   . Stroke Father   . Diabetes Brother   . Heart attack Brother     Social History   Social History  . Marital status: Married    Spouse name: N/A  . Number of children: N/A  . Years of education: N/A   Occupational History  . Not on file.   Social History Main Topics  . Smoking status: Never Smoker  . Smokeless tobacco: Never Used  . Alcohol use No  . Drug use: No  . Sexual activity: No   Other Topics Concern  . Not on file   Social History Narrative  . No narrative on file     Current Outpatient Prescriptions:  .  aspirin 81 MG tablet, Take 81 mg by mouth daily., Disp: , Rfl:  .  ibuprofen (ADVIL,MOTRIN) 600 MG tablet, Take by mouth., Disp: , Rfl:  .  niacin (NIASPAN) 500 MG CR tablet, Take 3 tablets by mouth at  bedtime, Disp: 270 tablet, Rfl: 3 .  SYNTHROID 75 MCG  tablet, , Disp: , Rfl:  .  SYNTHROID 88 MCG tablet, , Disp: , Rfl:   Allergies  Allergen Reactions  . Erythromycin Rash     Review of Systems  Constitutional: Negative for malaise/fatigue.  Cardiovascular: Negative for chest pain.  Gastrointestinal: Negative for abdominal pain.  Musculoskeletal: Negative for myalgias.    Objective  Vitals:   07/20/16 0951  BP: 124/82  Pulse: 97  Resp: 18  Temp: 98.6 F (37 C)  TempSrc: Oral  SpO2: 97%  Weight: 233 lb 12.8 oz (106.1 kg)  Height: 6\' 1"  (1.854 m)    Physical Exam  Constitutional: He is oriented to person, place, and time and well-developed, well-nourished, and in no distress.  HENT:  Head: Normocephalic and atraumatic.  Cardiovascular: Normal rate, regular rhythm and normal heart sounds.   No murmur heard. Pulmonary/Chest: Effort normal and breath sounds normal. He has no wheezes.  Abdominal: Soft. Bowel sounds are normal. There is no tenderness.  Neurological: He is alert and oriented to person, place, and time.  Psychiatric: Mood, memory, affect and judgment normal.  Nursing note and vitals reviewed.     Assessment & Plan  1. Low serum HDL He is staying physically active (jogging), recheck  lipids. - COMPLETE METABOLIC PANEL WITH GFR - Lipid panel  2. Hypertriglyceridemia Continue on Niaspan three tablet at bedtime.  - niacin (NIASPAN) 500 MG CR tablet; Take 3 tablets (1,500 mg total) by mouth at bedtime.  Dispense: 270 tablet; Refill: 3  3. Low testosterone In the past, never on therapy, will recheck.  - Testosterone   Zaccheus Edmister Asad A. [facility] 07/20/2016 10:26 AM

## 2016-07-21 LAB — TESTOSTERONE: Testosterone: 272 ng/dL (ref 250–827)

## 2016-07-23 ENCOUNTER — Telehealth: Payer: Self-pay | Admitting: Emergency Medicine

## 2016-07-23 NOTE — Telephone Encounter (Signed)
Patient notified. Appointment made for follow up

## 2016-08-04 ENCOUNTER — Ambulatory Visit: Payer: Medicare Other | Admitting: Family Medicine

## 2016-08-10 ENCOUNTER — Ambulatory Visit (INDEPENDENT_AMBULATORY_CARE_PROVIDER_SITE_OTHER): Payer: Medicare Other | Admitting: Family Medicine

## 2016-08-10 ENCOUNTER — Encounter: Payer: Self-pay | Admitting: Family Medicine

## 2016-08-10 ENCOUNTER — Encounter (INDEPENDENT_AMBULATORY_CARE_PROVIDER_SITE_OTHER): Payer: Self-pay

## 2016-08-10 VITALS — BP 122/76 | HR 79 | Temp 97.8°F | Resp 16 | Ht 73.0 in | Wt 238.7 lb

## 2016-08-10 DIAGNOSIS — E785 Hyperlipidemia, unspecified: Secondary | ICD-10-CM

## 2016-08-10 DIAGNOSIS — E786 Lipoprotein deficiency: Secondary | ICD-10-CM | POA: Diagnosis not present

## 2016-08-10 DIAGNOSIS — E781 Pure hyperglyceridemia: Secondary | ICD-10-CM

## 2016-08-10 DIAGNOSIS — R739 Hyperglycemia, unspecified: Secondary | ICD-10-CM

## 2016-08-10 DIAGNOSIS — E782 Mixed hyperlipidemia: Secondary | ICD-10-CM | POA: Insufficient documentation

## 2016-08-10 LAB — POCT GLYCOSYLATED HEMOGLOBIN (HGB A1C): HEMOGLOBIN A1C: 5.5

## 2016-08-10 MED ORDER — ROSUVASTATIN CALCIUM 5 MG PO TABS: 5.0000 mg | ORAL_TABLET | Freq: Every day | ORAL | 0 refills | Status: DC

## 2016-08-10 NOTE — Progress Notes (Signed)
Name: Alan Holt   MRN: [id number]    DOB: [birth year]   Date:08/10/2016       Progress Note  Subjective  Chief Complaint  Chief Complaint  Patient presents with  . Follow-up    Discuss statin therapy    Hyperlipidemia  This is a chronic problem. The problem is uncontrolled. Recent lipid tests were reviewed and are high (Elevated triglycerides, low HDL). Pertinent negatives include no myalgias. Current antihyperlipidemic treatment includes nicotinic acid, diet change and exercise. The current treatment provides mild improvement of lipids. There are no compliance problems.  Risk factors for coronary artery disease include dyslipidemia and male sex.  10 year risk of CVD is estimated to be 12%, patient not on statin therapy   Past Medical History:  Diagnosis Date  . Allergy   . Sleep apnea   . Thyroid disease     Past Surgical History:  Procedure Laterality Date  . HERNIA REPAIR     childhood  . KNEE SURGERY Left 1993  . THYROID SURGERY  2015  . TOTAL THYROIDECTOMY Bilateral 07/15/2014   Dr. Kathyrn Sheriff.     Family History  Problem Relation Age of Onset  . Diabetes Mother   . Alcohol abuse Father   . Stroke Father   . Diabetes Brother   . Heart attack Brother     Social History   Social History  . Marital status: Married    Spouse name: N/A  . Number of children: N/A  . Years of education: N/A   Occupational History  . Not on file.   Social History Main Topics  . Smoking status: Never Smoker  . Smokeless tobacco: Never Used  . Alcohol use No  . Drug use: No  . Sexual activity: No   Other Topics Concern  . Not on file   Social History Narrative  . No narrative on file     Current Outpatient Prescriptions:  .  aspirin 81 MG tablet, Take 81 mg by mouth daily., Disp: , Rfl:  .  ibuprofen (ADVIL,MOTRIN) 600 MG tablet, Take by mouth., Disp: , Rfl:  .  niacin (NIASPAN) 500 MG CR tablet, Take 3 tablets (1,500 mg total) by mouth at bedtime., Disp: 270 tablet,  Rfl: 3 .  SYNTHROID 75 MCG tablet, , Disp: , Rfl:  .  SYNTHROID 88 MCG tablet, , Disp: , Rfl:   Allergies  Allergen Reactions  . Erythromycin Rash     Review of Systems  Constitutional: Negative for chills and fever.  Gastrointestinal: Negative for abdominal pain.  Musculoskeletal: Negative for myalgias.      Objective  Vitals:   08/10/16 1021  BP: 122/76  Pulse: 79  Resp: 16  Temp: 97.8 F (36.6 C)  TempSrc: Oral  SpO2: 97%  Weight: 238 lb 11.2 oz (108.3 kg)  Height: 6\' 1"  (1.854 m)    Physical Exam  Constitutional: He is oriented to person, place, and time and well-developed, well-nourished, and in no distress.  Cardiovascular: Normal rate, regular rhythm and normal heart sounds.   No murmur heard. Pulmonary/Chest: Effort normal and breath sounds normal. He has no wheezes. He has no rales.  Neurological: He is alert and oriented to person, place, and time.  Psychiatric: Mood, memory, affect and judgment normal.  Nursing note and vitals reviewed.   Recent Results (from the past 2160 hour(s))  CBC with Differential     Status: None   Collection Time: 06/18/16 11:15 AM  Result Value Ref Range  WBC 7.3 3.4 - 10.8 x10E3/uL   RBC 5.01 4.14 - 5.80 x10E6/uL   Hemoglobin 15.9 12.6 - 17.7 g/dL   Hematocrit 45.3 37.5 - 51.0 %   MCV 90 79 - 97 fL   MCH 31.7 26.6 - 33.0 pg   MCHC 35.1 31.5 - 35.7 g/dL   RDW 13.5 12.3 - 15.4 %   Platelets 235 150 - 379 x10E3/uL   Neutrophils 66 %   Lymphs 26 %   Monocytes 7 %   Eos 1 %   Basos 0 %   Neutrophils Absolute 4.8 1.4 - 7.0 x10E3/uL   Lymphocytes Absolute 1.9 0.7 - 3.1 x10E3/uL   Monocytes Absolute 0.5 0.1 - 0.9 x10E3/uL   EOS (ABSOLUTE) 0.1 0.0 - 0.4 x10E3/uL   Basophils Absolute 0.0 0.0 - 0.2 x10E3/uL   Immature Granulocytes 0 %   Immature Grans (Abs) 0.0 0.0 - 0.1 x10E3/uL  COMPLETE METABOLIC PANEL WITH GFR     Status: Abnormal   Collection Time: 07/20/16 10:47 AM  Result Value Ref Range   Sodium 143 135 - 146  mmol/L   Potassium 4.2 3.5 - 5.3 mmol/L   Chloride 107 98 - 110 mmol/L   CO2 25 20 - 31 mmol/L   Glucose, Bld 110 (H) 65 - 99 mg/dL   BUN 20 7 - 25 mg/dL   Creat 0.99 0.70 - 1.25 mg/dL    Comment:   For patients > or = 66 years of age: The upper reference limit for Creatinine is approximately 13% higher for people identified as African-American.      Total Bilirubin 0.7 0.2 - 1.2 mg/dL   Alkaline Phosphatase 80 40 - 115 U/L   AST 28 10 - 35 U/L   ALT 37 9 - 46 U/L   Total Protein 7.4 6.1 - 8.1 g/dL   Albumin 4.8 3.6 - 5.1 g/dL   Calcium 9.9 8.6 - 10.3 mg/dL   GFR, Est African American >89 >=60 mL/min   GFR, Est Non African American 79 >=60 mL/min  Lipid panel     Status: Abnormal   Collection Time: 07/20/16 10:47 AM  Result Value Ref Range   Cholesterol 155 125 - 200 mg/dL   Triglycerides 245 (H) <150 mg/dL   HDL 38 (L) >=40 mg/dL   Total CHOL/HDL Ratio 4.1 <=5.0 Ratio   VLDL 49 (H) <30 mg/dL   LDL Cholesterol 68 <130 mg/dL    Comment:   Total Cholesterol/HDL Ratio:CHD Risk                        Coronary Heart Disease Risk Table                                        Men       Women          1/2 Average Risk              3.4        3.3              Average Risk              5.0        4.4           2X Average Risk              9.6  7.1           3X Average Risk             23.4       11.0 Use the calculated Patient Ratio above and the CHD Risk table  to determine the patient's CHD Risk.   Testosterone     Status: None   Collection Time: 07/20/16 10:47 AM  Result Value Ref Range   Testosterone 272 250 - 827 ng/dL    Comment: Men with clinically significant hypogonadal symptoms and testosterone values repeatedly less than approximately 300 ng/dL may benefit from testosterone treatment after adequate risk and benefits counseling.      Assessment & Plan  1. Hypertriglyceridemia Continue on Niaspan, we'll add statin therapy to lower triglycerides -  rosuvastatin (CRESTOR) 5 MG tablet; Take 1 tablet (5 mg total) by mouth at bedtime.  Dispense: 90 tablet; Refill: 0  2. Hyperlipidemia with low HDL Educated on the benefits of Mediterranean diet, encouraged to adopt a diet rich in fruits, vegetables, and whole gr. Advised to use Olive oil. Patient is physically active which should raise his HDL  3. Hyperglycemia  - POCT HgB A1C   Jarmal Lewelling Asad A. [facility] 08/10/2016 10:43 AM

## 2016-11-10 ENCOUNTER — Ambulatory Visit (INDEPENDENT_AMBULATORY_CARE_PROVIDER_SITE_OTHER): Payer: Medicare Other | Admitting: Family Medicine

## 2016-11-10 ENCOUNTER — Encounter: Payer: Self-pay | Admitting: Family Medicine

## 2016-11-10 VITALS — BP 122/84 | HR 85 | Temp 97.7°F | Resp 17 | Ht 73.0 in | Wt 230.2 lb

## 2016-11-10 DIAGNOSIS — E785 Hyperlipidemia, unspecified: Secondary | ICD-10-CM | POA: Diagnosis not present

## 2016-11-10 DIAGNOSIS — R030 Elevated blood-pressure reading, without diagnosis of hypertension: Secondary | ICD-10-CM | POA: Insufficient documentation

## 2016-11-10 DIAGNOSIS — E781 Pure hyperglyceridemia: Secondary | ICD-10-CM | POA: Diagnosis not present

## 2016-11-10 DIAGNOSIS — E786 Lipoprotein deficiency: Secondary | ICD-10-CM

## 2016-11-10 HISTORY — DX: Elevated blood-pressure reading, without diagnosis of hypertension: R03.0

## 2016-11-10 LAB — LIPID PANEL
Cholesterol: 103 mg/dL (ref <200)
HDL: 36 mg/dL — AB (ref >40)
LDL CALC: 40 mg/dL (ref <100)
Total CHOL/HDL Ratio: 2.9 Ratio (ref <5.0)
Triglycerides: 136 mg/dL (ref <150)
VLDL: 27 mg/dL (ref <30)

## 2016-11-10 MED ORDER — ROSUVASTATIN CALCIUM 5 MG PO TABS: 5.0000 mg | ORAL_TABLET | Freq: Every day | ORAL | 0 refills | Status: DC

## 2016-11-10 NOTE — Progress Notes (Signed)
Name: Alan Holt   MRN: [id number]    DOB: 12/08/1951   Date:11/10/2016       Progress Note  Subjective  Chief Complaint  Chief Complaint  Patient presents with  . Follow-up    3 mo  . Medication Refill    Hyperlipidemia  This is a chronic problem. The problem is uncontrolled. Recent lipid tests were reviewed and are high (elevated triglycerides). Pertinent negatives include no chest pain, leg pain, myalgias or shortness of breath. Current antihyperlipidemic treatment includes statins and nicotinic acid.    Past Medical History:  Diagnosis Date  . Allergy   . Sleep apnea   . Thyroid disease     Past Surgical History:  Procedure Laterality Date  . HERNIA REPAIR     childhood  . KNEE SURGERY Left 1993  . THYROID SURGERY  2015  . TOTAL THYROIDECTOMY Bilateral 07/15/2014   Dr. Kathyrn Sheriff.     Family History  Problem Relation Age of Onset  . Diabetes Mother   . Alcohol abuse Father   . Stroke Father   . Diabetes Brother   . Heart attack Brother     Social History   Social History  . Marital status: Married    Spouse name: N/A  . Number of children: N/A  . Years of education: N/A   Occupational History  . Not on file.   Social History Main Topics  . Smoking status: Never Smoker  . Smokeless tobacco: Never Used  . Alcohol use No  . Drug use: No  . Sexual activity: No   Other Topics Concern  . Not on file   Social History Narrative  . No narrative on file     Current Outpatient Prescriptions:  .  aspirin 81 MG tablet, Take 81 mg by mouth daily., Disp: , Rfl:  .  ibuprofen (ADVIL,MOTRIN) 600 MG tablet, Take by mouth., Disp: , Rfl:  .  niacin (NIASPAN) 500 MG CR tablet, Take 3 tablets (1,500 mg total) by mouth at bedtime., Disp: 270 tablet, Rfl: 3 .  rosuvastatin (CRESTOR) 5 MG tablet, Take 1 tablet (5 mg total) by mouth at bedtime., Disp: 90 tablet, Rfl: 0 .  SYNTHROID 75 MCG tablet, , Disp: , Rfl:  .  SYNTHROID 88 MCG tablet, , Disp: , Rfl:    Allergies  Allergen Reactions  . Erythromycin Rash     Review of Systems  Constitutional: Negative for chills, fever and malaise/fatigue.  Eyes: Negative for blurred vision.  Respiratory: Negative for shortness of breath.   Cardiovascular: Negative for chest pain and palpitations.  Gastrointestinal: Negative for abdominal pain, nausea and vomiting.  Musculoskeletal: Negative for myalgias.  Neurological: Negative for dizziness and headaches.    Objective  Vitals:   11/10/16 0754 11/10/16 0826  BP: (!) 121/93 122/84  Pulse: 85   Resp: 17   Temp: 97.7 F (36.5 C)   TempSrc: Oral   SpO2: 96%   Weight: 230 lb 3.2 oz (104.4 kg)   Height: 6\' 1"  (1.854 m)     Physical Exam  Constitutional: He is oriented to person, place, and time and well-developed, well-nourished, and in no distress.  HENT:  Head: Normocephalic and atraumatic.  Cardiovascular: Normal rate, regular rhythm and normal heart sounds.   No murmur heard. Pulmonary/Chest: Effort normal and breath sounds normal. He has no wheezes.  Abdominal: Soft. Bowel sounds are normal. There is no tenderness.  Neurological: He is alert and oriented to person, place, and time.  Psychiatric:  Mood, memory, affect and judgment normal.  Nursing note and vitals reviewed.     Assessment & Plan  1. Hyperlipidemia with low HDL Now on statin, recheck FLP - Lipid Profile  2. Hypertriglyceridemia Expect improvement in triglycerides with the addition of statin therapy, continue niacin - Lipid Profile - rosuvastatin (CRESTOR) 5 MG tablet; Take 1 tablet (5 mg total) by mouth at bedtime.  Dispense: 90 tablet; Refill: 0  3. Elevated BP without diagnosis of hypertension Repeat blood pressure is within normal range, patient reassured   Vaudine Dutan Asad A. [facility] 11/10/2016 8:36 AM

## 2016-11-16 IMAGING — US US ABDOMEN LIMITED
1 series · 10 of 10 positions shown · non-contrast
Comparison: None.

CLINICAL DATA: Left upper abdominal fullness

EXAM:
LIMITED ABDOMINAL ULTRASOUND

[Series 1: us abdomen limited · 0.26mm/px · 10 of 10 slices shown]
[im 1/10]
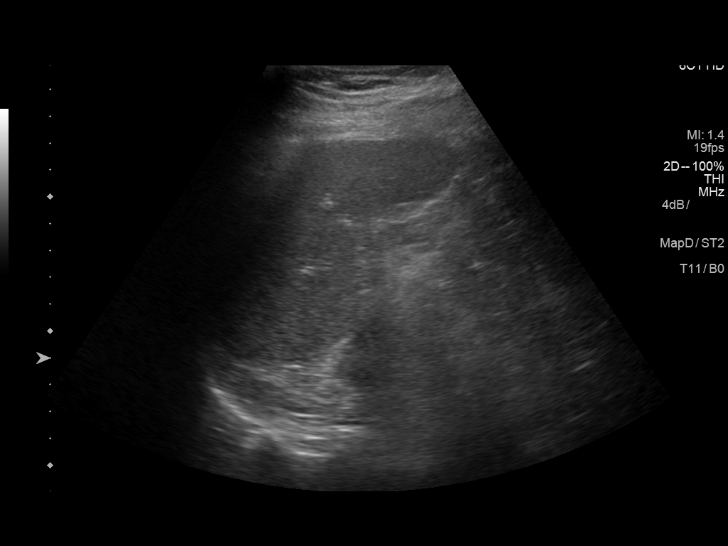
[im 2/10]
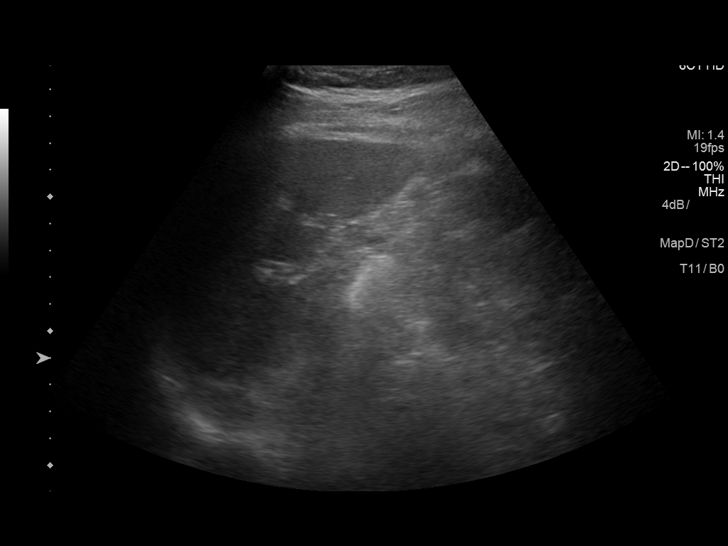
[im 3/10]
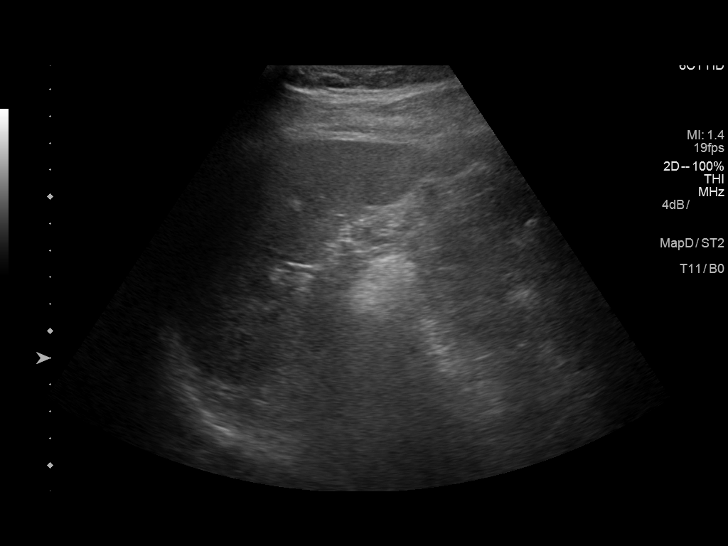
[im 4/10]
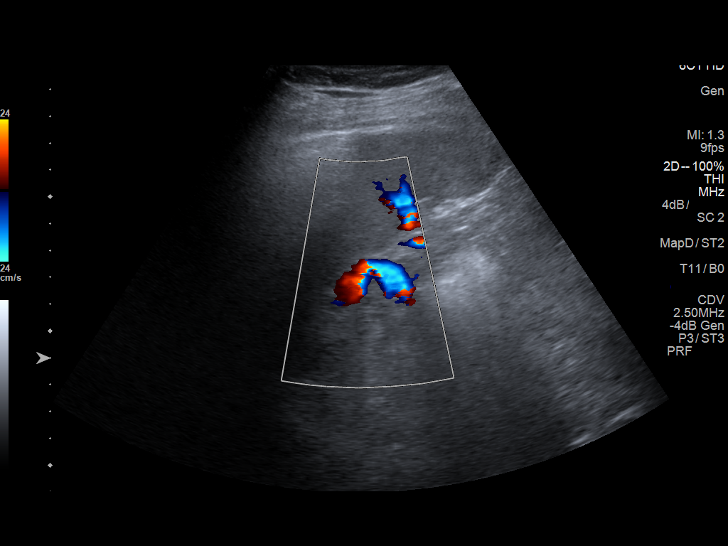
[im 5/10]
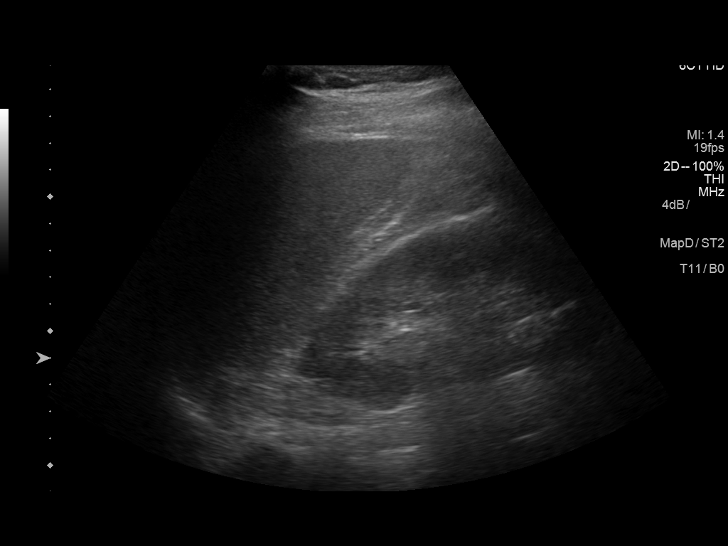
[im 6/10]
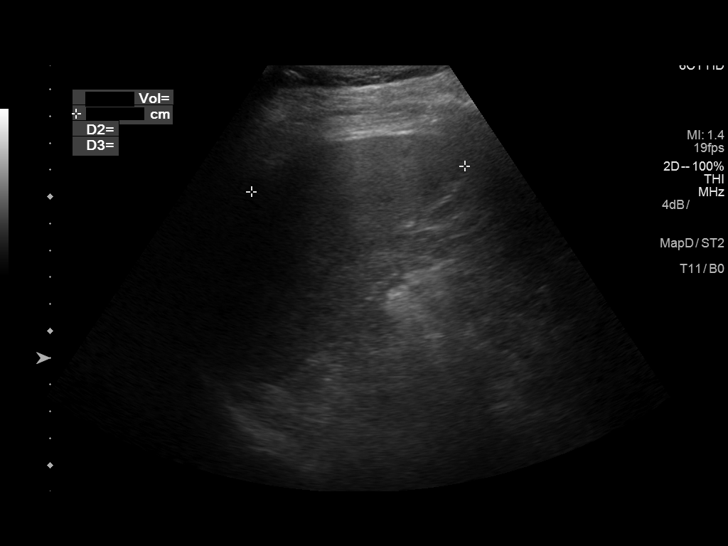
[im 7/10]
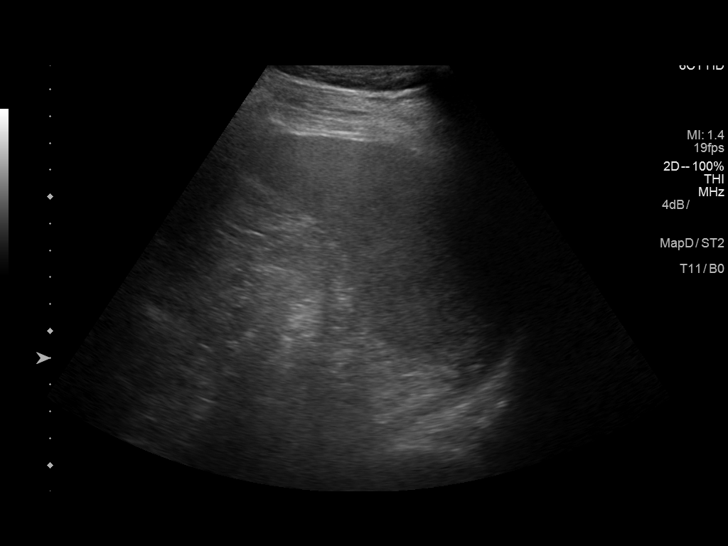
[im 8/10]
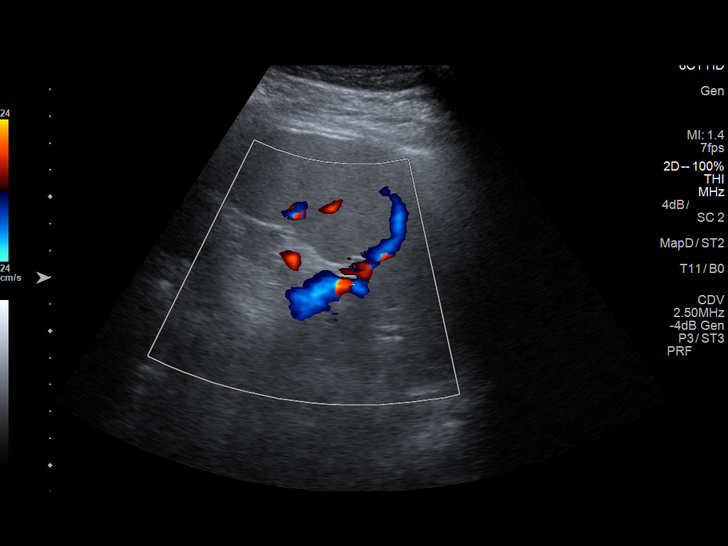
[im 9/10]
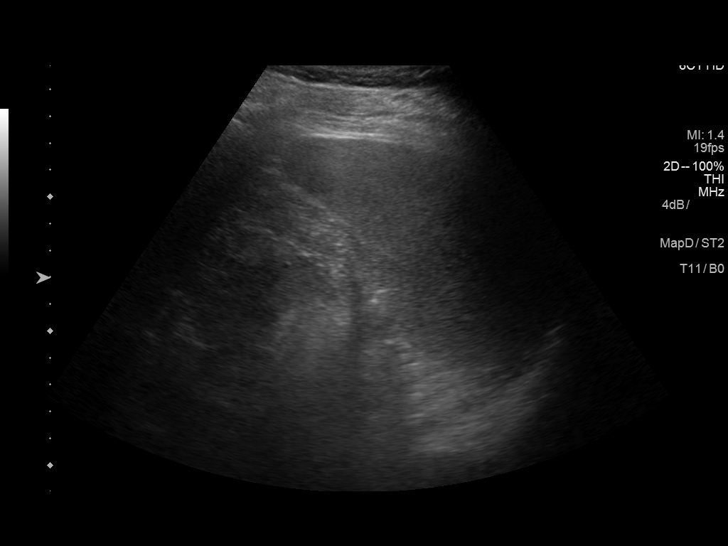
[im 10/10]
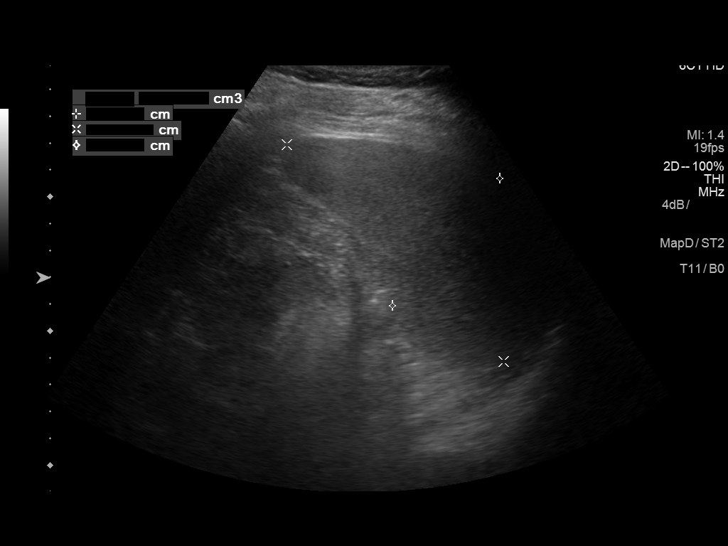

[10 of 10 positions shown; findings below may reference images not displayed]

FINDINGS: Limited scanning of the spleen region was performed. Spleen is
homogeneous in appearance without focal lesion, measuring 8 x 11.4 x
6.2 cm (volume = 294 cc).
IMPRESSION: Normal spleen

## 2017-01-11 ENCOUNTER — Encounter: Payer: Medicare Other | Admitting: Family Medicine

## 2017-01-21 ENCOUNTER — Ambulatory Visit: Payer: Medicare Other | Admitting: Family Medicine

## 2017-02-01 ENCOUNTER — Ambulatory Visit (INDEPENDENT_AMBULATORY_CARE_PROVIDER_SITE_OTHER): Payer: Medicare Other | Admitting: Family Medicine

## 2017-02-01 ENCOUNTER — Encounter: Payer: Self-pay | Admitting: Family Medicine

## 2017-02-01 VITALS — BP 127/81 | HR 78 | Temp 97.7°F | Resp 16 | Ht 73.0 in | Wt 237.2 lb

## 2017-02-01 DIAGNOSIS — Z0000 Encounter for general adult medical examination without abnormal findings: Secondary | ICD-10-CM

## 2017-02-01 LAB — CBC WITH DIFFERENTIAL/PLATELET
Basophils Absolute: 0 cells/uL (ref 0–200)
Basophils Relative: 0 %
EOS ABS: 106 {cells}/uL (ref 15–500)
Eosinophils Relative: 2 %
HEMATOCRIT: 48.2 % (ref 38.5–50.0)
Hemoglobin: 16.5 g/dL (ref 13.2–17.1)
LYMPHS PCT: 42 %
Lymphs Abs: 2226 cells/uL (ref 850–3900)
MCH: 31.1 pg (ref 27.0–33.0)
MCHC: 34.2 g/dL (ref 32.0–36.0)
MCV: 90.8 fL (ref 80.0–100.0)
MONO ABS: 371 {cells}/uL (ref 200–950)
MPV: 9.2 fL (ref 7.5–12.5)
Monocytes Relative: 7 %
NEUTROS PCT: 49 %
Neutro Abs: 2597 cells/uL (ref 1500–7800)
Platelets: 208 10*3/uL (ref 140–400)
RBC: 5.31 MIL/uL (ref 4.20–5.80)
RDW: 14.1 % (ref 11.0–15.0)
WBC: 5.3 10*3/uL (ref 3.8–10.8)

## 2017-02-01 LAB — PSA: PSA: 1.4 ng/mL (ref <=4.0)

## 2017-02-01 NOTE — Progress Notes (Signed)
Name: Alan Holt   MRN: [id number]    DOB: 14-Dec-1951   Date:02/01/2017       Progress Note  Subjective  Chief Complaint  Chief Complaint  Patient presents with  . Annual Exam    CPE    HPI  Pt. Presents for Annual Physical Exam. Last colonoscopy was 4 years ago, repeat in 2024. Last PSA and prostate exam was completed in June 2015.   Past Medical History:  Diagnosis Date  . Allergy   . Sleep apnea   . Thyroid disease     Past Surgical History:  Procedure Laterality Date  . HERNIA REPAIR     childhood  . KNEE SURGERY Left 1993  . THYROID SURGERY  2015  . TOTAL THYROIDECTOMY Bilateral 07/15/2014   Dr. Kathyrn Sheriff.     Family History  Problem Relation Age of Onset  . Diabetes Mother   . Alcohol abuse Father   . Stroke Father   . Diabetes Brother   . Heart attack Brother     Social History   Social History  . Marital status: Married    Spouse name: N/A  . Number of children: N/A  . Years of education: N/A   Occupational History  . Not on file.   Social History Main Topics  . Smoking status: Never Smoker  . Smokeless tobacco: Never Used  . Alcohol use No  . Drug use: No  . Sexual activity: No   Other Topics Concern  . Not on file   Social History Narrative  . No narrative on file     Current Outpatient Prescriptions:  .  aspirin 81 MG tablet, Take 81 mg by mouth daily., Disp: , Rfl:  .  ibuprofen (ADVIL,MOTRIN) 600 MG tablet, Take by mouth., Disp: , Rfl:  .  niacin (NIASPAN) 500 MG CR tablet, Take 3 tablets (1,500 mg total) by mouth at bedtime., Disp: 270 tablet, Rfl: 3 .  rosuvastatin (CRESTOR) 5 MG tablet, Take 1 tablet (5 mg total) by mouth at bedtime., Disp: 90 tablet, Rfl: 0 .  SYNTHROID 75 MCG tablet, , Disp: , Rfl:  .  SYNTHROID 88 MCG tablet, , Disp: , Rfl:   Allergies  Allergen Reactions  . Erythromycin Rash     Review of Systems  Constitutional: Negative for chills, fever, malaise/fatigue and weight loss.  HENT: Negative for  congestion, sinus pain and sore throat.   Eyes: Negative for blurred vision and double vision.  Respiratory: Negative for cough, sputum production and shortness of breath.   Cardiovascular: Negative for chest pain and leg swelling.  Gastrointestinal: Negative for abdominal pain, blood in stool, nausea and vomiting.  Genitourinary: Negative for dysuria and hematuria.  Musculoskeletal: Negative for back pain and neck pain.  Neurological: Negative for headaches.  Psychiatric/Behavioral: Negative for depression. The patient is not nervous/anxious and does not have insomnia.       Objective  Vitals:   02/01/17 0853  BP: 127/81  Pulse: 78  Resp: 16  Temp: 97.7 F (36.5 C)  TempSrc: Oral  SpO2: 97%  Weight: 237 lb 3.2 oz (107.6 kg)  Height: 6\' 1"  (1.854 m)    Physical Exam  Constitutional: He is oriented to person, place, and time and well-developed, well-nourished, and in no distress.  HENT:  Head: Normocephalic and atraumatic.  Cardiovascular: Normal rate, regular rhythm and normal heart sounds.   No murmur heard. Pulmonary/Chest: Effort normal and breath sounds normal. He has no wheezes.  Abdominal: Soft. Bowel sounds  are normal. There is no tenderness.  Genitourinary: Prostate normal. Rectal exam shows external hemorrhoid. Rectal exam shows no tenderness. Prostate is not tender.  Musculoskeletal: Normal range of motion. He exhibits no edema.  Neurological: He is alert and oriented to person, place, and time.  Skin: Skin is warm, dry and intact.  Psychiatric: Mood, memory, affect and judgment normal.  Nursing note and vitals reviewed.      Assessment & Plan  1. Encounter for annual physical exam Obtain age appropriate laboratory screenings. - CBC with Differential/Platelet - PSA - Vitamin D (25 hydroxy)   Syed Asad A. [facility] 02/01/2017 9:04 AM

## 2017-02-02 LAB — VITAMIN D 25 HYDROXY (VIT D DEFICIENCY, FRACTURES): Vit D, 25-Hydroxy: 49 ng/mL (ref 30–100)

## 2017-02-08 ENCOUNTER — Other Ambulatory Visit: Payer: Self-pay | Admitting: Family Medicine

## 2017-02-08 DIAGNOSIS — E781 Pure hyperglyceridemia: Secondary | ICD-10-CM

## 2017-05-03 ENCOUNTER — Encounter: Payer: Self-pay | Admitting: Family Medicine

## 2017-05-03 ENCOUNTER — Ambulatory Visit (INDEPENDENT_AMBULATORY_CARE_PROVIDER_SITE_OTHER): Payer: Medicare Other | Admitting: Family Medicine

## 2017-05-03 VITALS — BP 120/79 | HR 70 | Temp 97.5°F | Resp 16 | Ht 73.0 in | Wt 236.8 lb

## 2017-05-03 DIAGNOSIS — E781 Pure hyperglyceridemia: Secondary | ICD-10-CM | POA: Diagnosis not present

## 2017-05-03 LAB — COMPLETE METABOLIC PANEL WITH GFR
ALBUMIN: 4.3 g/dL (ref 3.6–5.1)
ALK PHOS: 66 U/L (ref 40–115)
ALT: 41 U/L (ref 9–46)
AST: 29 U/L (ref 10–35)
BUN: 20 mg/dL (ref 7–25)
CALCIUM: 8.9 mg/dL (ref 8.6–10.3)
CHLORIDE: 108 mmol/L (ref 98–110)
CO2: 25 mmol/L (ref 20–31)
CREATININE: 0.88 mg/dL (ref 0.70–1.25)
GFR, Est African American: >89 mL/min (ref >=60)
GFR, Est Non African American: >89 mL/min (ref >=60)
GLUCOSE: 110 mg/dL — AB (ref 65–99)
POTASSIUM: 4 mmol/L (ref 3.5–5.3)
SODIUM: 141 mmol/L (ref 135–146)
Total Bilirubin: 0.8 mg/dL (ref 0.2–1.2)
Total Protein: 6.4 g/dL (ref 6.1–8.1)

## 2017-05-03 LAB — LIPID PANEL
CHOL/HDL RATIO: 2.7 ratio (ref <5.0)
Cholesterol: 98 mg/dL (ref <200)
HDL: 36 mg/dL — ABNORMAL LOW (ref >40)
LDL CALC: 35 mg/dL (ref <100)
TRIGLYCERIDES: 137 mg/dL (ref <150)
VLDL: 27 mg/dL (ref <30)

## 2017-05-03 NOTE — Progress Notes (Signed)
Name: Alan Holt   MRN: [id number]    DOB: Jun 04, 1951   Date:05/03/2017       Progress Note  Subjective  Chief Complaint  Chief Complaint  Patient presents with  . Follow-up    3 mo  . Medication Refill    Hyperlipidemia  This is a chronic problem. The problem is controlled. Recent lipid tests were reviewed and are normal. Pertinent negatives include no chest pain, leg pain, myalgias or shortness of breath. Current antihyperlipidemic treatment includes statins and nicotinic acid.  Has hypertriglyceridemia, takes Niaspan and statin, no side effects reported, recheck FLP today   Past Medical History:  Diagnosis Date  . Allergy   . Sleep apnea   . Thyroid disease     Past Surgical History:  Procedure Laterality Date  . HERNIA REPAIR     childhood  . KNEE SURGERY Left 1993  . THYROID SURGERY  2015  . TOTAL THYROIDECTOMY Bilateral 07/15/2014   Dr. Kathyrn Sheriff.     Family History  Problem Relation Age of Onset  . Diabetes Mother   . Alcohol abuse Father   . Stroke Father   . Diabetes Brother   . Heart attack Brother     Social History   Social History  . Marital status: Married    Spouse name: N/A  . Number of children: N/A  . Years of education: N/A   Occupational History  . Not on file.   Social History Main Topics  . Smoking status: Never Smoker  . Smokeless tobacco: Never Used  . Alcohol use No  . Drug use: No  . Sexual activity: No   Other Topics Concern  . Not on file   Social History Narrative  . No narrative on file     Current Outpatient Prescriptions:  .  aspirin 81 MG tablet, Take 81 mg by mouth daily., Disp: , Rfl:  .  ibuprofen (ADVIL,MOTRIN) 600 MG tablet, Take by mouth., Disp: , Rfl:  .  niacin (NIASPAN) 500 MG CR tablet, Take 3 tablets (1,500 mg total) by mouth at bedtime., Disp: 270 tablet, Rfl: 3 .  rosuvastatin (CRESTOR) 5 MG tablet, take 1 tablet by mouth at bedtime, Disp: 90 tablet, Rfl: 0 .  SYNTHROID 75 MCG tablet, , Disp: , Rfl:   .  SYNTHROID 88 MCG tablet, , Disp: , Rfl:   Allergies  Allergen Reactions  . Erythromycin Rash     Review of Systems  Respiratory: Negative for shortness of breath.   Cardiovascular: Negative for chest pain.  Musculoskeletal: Negative for myalgias.      Objective  Vitals:   05/03/17 0833  BP: 120/79  Pulse: 70  Resp: 16  Temp: 97.5 F (36.4 C)  TempSrc: Oral  SpO2: 96%  Weight: 236 lb 12.8 oz (107.4 kg)  Height: 6\' 1"  (1.854 m)    Physical Exam  Constitutional: He is oriented to person, place, and time and well-developed, well-nourished, and in no distress.  HENT:  Head: Normocephalic and atraumatic.  Cardiovascular: Normal rate, regular rhythm and normal heart sounds.   No murmur heard. Pulmonary/Chest: Effort normal and breath sounds normal. He has no wheezes.  Neurological: He is alert and oriented to person, place, and time.  Psychiatric: Mood, memory, affect and judgment normal.  Nursing note and vitals reviewed.     Assessment & Plan  1. Hypertriglyceridemia  - Lipid panel - COMPLETE METABOLIC PANEL WITH GFR   Lajune Perine Asad A. [facility] 05/03/2017 8:41 AM

## 2017-05-14 ENCOUNTER — Other Ambulatory Visit: Payer: Self-pay | Admitting: Family Medicine

## 2017-05-14 DIAGNOSIS — E781 Pure hyperglyceridemia: Secondary | ICD-10-CM

## 2017-08-17 ENCOUNTER — Other Ambulatory Visit: Payer: Self-pay | Admitting: Family Medicine

## 2017-08-17 DIAGNOSIS — E781 Pure hyperglyceridemia: Secondary | ICD-10-CM

## 2017-10-26 ENCOUNTER — Ambulatory Visit (INDEPENDENT_AMBULATORY_CARE_PROVIDER_SITE_OTHER): Payer: Medicare Other

## 2017-10-26 VITALS — BP 128/62 | HR 60 | Temp 98.1°F | Resp 16 | Ht 73.0 in | Wt 236.8 lb

## 2017-10-26 DIAGNOSIS — Z23 Encounter for immunization: Secondary | ICD-10-CM

## 2017-10-26 DIAGNOSIS — Z1159 Encounter for screening for other viral diseases: Secondary | ICD-10-CM

## 2017-10-26 DIAGNOSIS — Z0000 Encounter for general adult medical examination without abnormal findings: Secondary | ICD-10-CM | POA: Diagnosis not present

## 2017-10-26 NOTE — Patient Instructions (Addendum)
Alan Holt , Thank you for taking time to come for your Medicare Wellness Visit. I appreciate your ongoing commitment to your health goals. Please review the following plan we discussed and let me know if I can assist you in the future.   Screening recommendations/referrals: Colonoscopy: Completed 05/25/13. Repeat colonoscopy in 10 years Recommended yearly ophthalmology/optometry visit for glaucoma screening and checkup Recommended yearly dental visit for hygiene and checkup  Vaccinations: Influenza vaccine: Declined. Please provide a copy of your immunization record Pneumococcal vaccine: Given today Tdap vaccine: Declined. Please call your insurance company to determine your out of pocket expense. Shingles vaccine: Declined. Please call your insurance company to determine your out of pocket expense.    Advanced directives: Please bring a copy of your POA (Power of Attorney) and/or Living Will to your next appointment.   Conditions/risks identified: Fall risk prevention discussed  Next appointment: Scheduled to see Dr. Manuella Ghazi on 11/01/17 @ 8:40am. Follow up with the Nurse Health Advisor in one year for your annual wellness exam.  Preventive Care 65 Years and Older, Male Preventive care refers to lifestyle choices and visits with your health care provider that can promote health and wellness. What does preventive care include?  A yearly physical exam. This is also called an annual well check.  Dental exams once or twice a year.  Routine eye exams. Ask your health care provider how often you should have your eyes checked.  Personal lifestyle choices, including:  Daily care of your teeth and gums.  Regular physical activity.  Eating a healthy diet.  Avoiding tobacco and drug use.  Limiting alcohol use.  Practicing safe sex.  Taking low doses of aspirin every day.  Taking vitamin and mineral supplements as recommended by your health care provider. What happens during an annual  well check? The services and screenings done by your health care provider during your annual well check will depend on your age, overall health, lifestyle risk factors, and family history of disease. Counseling  Your health care provider may ask you questions about your:  Alcohol use.  Tobacco use.  Drug use.  Emotional well-being.  Home and relationship well-being.  Sexual activity.  Eating habits.  History of falls.  Memory and ability to understand (cognition).  Work and work Statistician. Screening  You may have the following tests or measurements:  Height, weight, and BMI.  Blood pressure.  Lipid and cholesterol levels. These may be checked every 5 years, or more frequently if you are over 37 years old.  Skin check.  Lung cancer screening. You may have this screening every year starting at age 92 if you have a 30-pack-year history of smoking and currently smoke or have quit within the past 15 years.  Fecal occult blood test (FOBT) of the stool. You may have this test every year starting at age 71.  Flexible sigmoidoscopy or colonoscopy. You may have a sigmoidoscopy every 5 years or a colonoscopy every 10 years starting at age 2.  Prostate cancer screening. Recommendations will vary depending on your family history and other risks.  Hepatitis C blood test.  Hepatitis B blood test.  Sexually transmitted disease (STD) testing.  Diabetes screening. This is done by checking your blood sugar (glucose) after you have not eaten for a while (fasting). You may have this done every 1-3 years.  Abdominal aortic aneurysm (AAA) screening. You may need this if you are a current or former smoker.  Osteoporosis. You may be screened starting at age 28  if you are at high risk. Talk with your health care provider about your test results, treatment options, and if necessary, the need for more tests. Vaccines  Your health care provider may recommend certain vaccines, such  as:  Influenza vaccine. This is recommended every year.  Tetanus, diphtheria, and acellular pertussis (Tdap, Td) vaccine. You may need a Td booster every 10 years.  Zoster vaccine. You may need this after age 41.  Pneumococcal 13-valent conjugate (PCV13) vaccine. One dose is recommended after age 4.  Pneumococcal polysaccharide (PPSV23) vaccine. One dose is recommended after age 74. Talk to your health care provider about which screenings and vaccines you need and how often you need them. This information is not intended to replace advice given to you by your health care provider. Make sure you discuss any questions you have with your health care provider. Document Released: 01/09/2016 Document Revised: 09/01/2016 Document Reviewed: 10/14/2015 Elsevier Interactive Patient Education  2017 [employer].  Pneumococcal Conjugate Vaccine suspension for injection What is this medicine? PNEUMOCOCCAL VACCINE (NEU mo KOK al vak SEEN) is a vaccine used to prevent pneumococcus bacterial infections. These bacteria can cause serious infections like pneumonia, meningitis, and blood infections. This vaccine will lower your chance of getting pneumonia. If you do get pneumonia, it can make your symptoms milder and your illness shorter. This vaccine will not treat an infection and will not cause infection. This vaccine is recommended for infants and young children, adults with certain medical conditions, and adults 36 years or older. This medicine may be used for other purposes; ask your health care provider or pharmacist if you have questions. COMMON BRAND NAME(S): Prevnar, Prevnar 13 What should I tell my health care provider before I take this medicine? They need to know if you have any of these conditions: -bleeding problems -fever -immune system problems -an unusual or allergic reaction to pneumococcal vaccine, diphtheria toxoid, other vaccines, latex, other medicines, foods, dyes, or  preservatives -pregnant or trying to get pregnant -breast-feeding How should I use this medicine? This vaccine is for injection into a muscle. It is given by a health care professional. A copy of Vaccine Information Statements will be given before each vaccination. Read this sheet carefully each time. The sheet may change frequently. Talk to your pediatrician regarding the use of this medicine in children. While this drug may be prescribed for children as young as 71 weeks old for selected conditions, precautions do apply. Overdosage: If you think you have taken too much of this medicine contact a poison control center or emergency room at once. NOTE: This medicine is only for you. Do not share this medicine with others. What if I miss a dose? It is important not to miss your dose. Call your doctor or health care professional if you are unable to keep an appointment. What may interact with this medicine? -medicines for cancer chemotherapy -medicines that suppress your immune function -steroid medicines like prednisone or cortisone This list may not describe all possible interactions. Give your health care provider a list of all the medicines, herbs, non-prescription drugs, or dietary supplements you use. Also tell them if you smoke, drink alcohol, or use illegal drugs. Some items may interact with your medicine. What should I watch for while using this medicine? Mild fever and pain should go away in 3 days or less. Report any unusual symptoms to your doctor or health care professional. What side effects may I notice from receiving this medicine? Side effects that you should  report to your doctor or health care professional as soon as possible: -allergic reactions like skin rash, itching or hives, swelling of the face, lips, or tongue -breathing problems -confused -fast or irregular heartbeat -fever over 102 degrees F -seizures -unusual bleeding or bruising -unusual muscle weakness Side  effects that usually do not require medical attention (report to your doctor or health care professional if they continue or are bothersome): -aches and pains -diarrhea -fever of 102 degrees F or less -headache -irritable -loss of appetite -pain, tender at site where injected -trouble sleeping This list may not describe all possible side effects. Call your doctor for medical advice about side effects. You may report side effects to FDA at 1-800-FDA-1088. Where should I keep my medicine? This does not apply. This vaccine is given in a clinic, pharmacy, doctor's office, or other health care setting and will not be stored at home. NOTE: This sheet is a summary. It may not cover all possible information. If you have questions about this medicine, talk to your doctor, pharmacist, or health care provider.  2018 Elsevier/Gold Standard (2014-09-19 10:27:27)   Fall Prevention in the Home Falls can cause injuries. They can happen to people of all ages. There are many things you can do to make your home safe and to help prevent falls. What can I do on the outside of my home?  Regularly fix the edges of walkways and driveways and fix any cracks.  Remove anything that might make you trip as you walk through a door, such as a raised step or threshold.  Trim any bushes or trees on the path to your home.  Use bright outdoor lighting.  Clear any walking paths of anything that might make someone trip, such as rocks or tools.  Regularly check to see if handrails are loose or broken. Make sure that both sides of any steps have handrails.  Any raised decks and porches should have guardrails on the edges.  Have any leaves, snow, or ice cleared regularly.  Use sand or salt on walking paths during winter.  Clean up any spills in your garage right away. This includes oil or grease spills. What can I do in the bathroom?  Use night lights.  Install grab bars by the toilet and in the tub and shower.  Do not use towel bars as grab bars.  Use non-skid mats or decals in the tub or shower.  If you need to sit down in the shower, use a plastic, non-slip stool.  Keep the floor dry. Clean up any water that spills on the floor as soon as it happens.  Remove soap buildup in the tub or shower regularly.  Attach bath mats securely with double-sided non-slip rug tape.  Do not have throw rugs and other things on the floor that can make you trip. What can I do in the bedroom?  Use night lights.  Make sure that you have a light by your bed that is easy to reach.  Do not use any sheets or blankets that are too big for your bed. They should not hang down onto the floor.  Have a firm chair that has side arms. You can use this for support while you get dressed.  Do not have throw rugs and other things on the floor that can make you trip. What can I do in the kitchen?  Clean up any spills right away.  Avoid walking on wet floors.  Keep items that you use a lot  in easy-to-reach places.  If you need to reach something above you, use a strong step stool that has a grab bar.  Keep electrical cords out of the way.  Do not use floor polish or wax that makes floors slippery. If you must use wax, use non-skid floor wax.  Do not have throw rugs and other things on the floor that can make you trip. What can I do with my stairs?  Do not leave any items on the stairs.  Make sure that there are handrails on both sides of the stairs and use them. Fix handrails that are broken or loose. Make sure that handrails are as long as the stairways.  Check any carpeting to make sure that it is firmly attached to the stairs. Fix any carpet that is loose or worn.  Avoid having throw rugs at the top or bottom of the stairs. If you do have throw rugs, attach them to the floor with carpet tape.  Make sure that you have a light switch at the top of the stairs and the bottom of the stairs. If you do not have them,  ask someone to add them for you. What else can I do to help prevent falls?  Wear shoes that:  Do not have high heels.  Have rubber bottoms.  Are comfortable and fit you well.  Are closed at the toe. Do not wear sandals.  If you use a stepladder:  Make sure that it is fully opened. Do not climb a closed stepladder.  Make sure that both sides of the stepladder are locked into place.  Ask someone to hold it for you, if possible.  Clearly mark and make sure that you can see:  Any grab bars or handrails.  First and last steps.  Where the edge of each step is.  Use tools that help you move around (mobility aids) if they are needed. These include:  Canes.  Walkers.  Scooters.  Crutches.  Turn on the lights when you go into a dark area. Replace any light bulbs as soon as they burn out.  Set up your furniture so you have a clear path. Avoid moving your furniture around.  If any of your floors are uneven, fix them.  If there are any pets around you, be aware of where they are.  Review your medicines with your doctor. Some medicines can make you feel dizzy. This can increase your chance of falling. Ask your doctor what other things that you can do to help prevent falls. This information is not intended to replace advice given to you by your health care provider. Make sure you discuss any questions you have with your health care provider. Document Released: 10/09/2009 Document Revised: 05/20/2016 Document Reviewed: 01/17/2015 Elsevier Interactive Patient Education  2017 [employer].

## 2017-10-26 NOTE — Progress Notes (Signed)
Subjective:   Alan Holt is a 66 y.o. male who presents for Medicare Annual/Subsequent preventive examination.  Review of Systems:  N/A Cardiac Risk Factors include: advanced age (>37men, >45 women);dyslipidemia;hypertension;obesity (BMI >30kg/m2);male gender     Objective:    Vitals: BP 128/62 (BP Location: Left Arm, Patient Position: Sitting, Cuff Size: Normal)   Pulse 60   Temp 98.1 F (36.7 C) (Oral)   Resp 16   Ht 6\' 1"  (1.854 m)   Wt 236 lb 12.8 oz (107.4 kg)   BMI 31.24 kg/m   Body mass index is 31.24 kg/m.  Tobacco History  Smoking Status  . Never Smoker  Smokeless Tobacco  . Never Used     Counseling given: Not Answered   Past Medical History:  Diagnosis Date  . Allergy   . Hypertension   . Sleep apnea   . Thyroid disease    Past Surgical History:  Procedure Laterality Date  . HERNIA REPAIR     childhood  . KNEE SURGERY Left 1993  . THYROID SURGERY  2015  . TOTAL THYROIDECTOMY Bilateral 07/15/2014   Dr. Kathyrn Sheriff.    Family History  Problem Relation Age of Onset  . Diabetes Mother   . Alcohol abuse Father   . Stroke Father   . Diabetes Brother   . Heart attack Brother    History  Sexual Activity  . Sexual activity: No    Outpatient Encounter Prescriptions as of 10/26/2017  Medication Sig  . aspirin 81 MG tablet Take 81 mg by mouth daily.  Marland Kitchen ibuprofen (ADVIL,MOTRIN) 600 MG tablet Take by mouth.  . niacin (NIASPAN) 500 MG CR tablet Take 3 tablets (1,500 mg total) by mouth at bedtime.  . rosuvastatin (CRESTOR) 5 MG tablet take 1 tablet by mouth at bedtime  . SYNTHROID 75 MCG tablet   . SYNTHROID 88 MCG tablet    No facility-administered encounter medications on file as of 10/26/2017.     Activities of Daily Living In your present state of health, do you have any difficulty performing the following activities: 10/26/2017 05/03/2017  Hearing? N N  Vision? N Y  Comment - glasses  Difficulty concentrating or making decisions? N N    Walking or climbing stairs? N N  Dressing or bathing? N N  Doing errands, shopping? N N  Preparing Food and eating ? N -  Using the Toilet? N -  In the past six months, have you accidently leaked urine? N -  Do you have problems with loss of bowel control? N -  Managing your Medications? N -  Managing your Finances? N -  Housekeeping or managing your Housekeeping? N -  Some recent data might be hidden    Patient Care Team: Roselee Nova, MD as PCP - General (Family Medicine) Margaretha Sheffield, MD as Consulting Physician (Otolaryngology)   Assessment:     Exercise Activities and Dietary recommendations Current Exercise Habits: Structured exercise class, Type of exercise: Other - see comments (jogging), Time (Minutes): 30, Frequency (Times/Week): 5, Weekly Exercise (Minutes/Week): 150, Intensity: Mild, Exercise limited by: None identified  Goals    . Exercise 150 minutes per week (moderate activity)          Continue to remain active by jogging, or doing other strengthening exercises for a minimum of 150 minutes per week.      Fall Risk: TUG test score = 9 sec Fall Risk  10/26/2017 05/03/2017 02/01/2017 11/10/2016 08/10/2016  Falls in the past  year? No No No No No   Depression Screen PHQ 2/9 Scores 10/26/2017 05/03/2017 02/01/2017 11/10/2016  PHQ - 2 Score 0 0 0 0    Cognitive Function: declined        Immunization History  Administered Date(s) Administered  . Influenza-Unspecified 09/27/2015, 10/05/2016  . Pneumococcal Conjugate-13 10/26/2017   Screening Tests Health Maintenance  Topic Date Due  . Hepatitis C Screening  1951/01/24  . INFLUENZA VACCINE  11/01/2017 (Originally 07/27/2017)  . TETANUS/TDAP  11/01/2017 (Originally 06/07/1969)  . PNA vac Low Risk Adult (2 of 2 - PPSV23) 10/26/2018  . COLONOSCOPY  05/26/2023      Plan:   I have personally reviewed and addressed the Medicare Annual Wellness questionnaire and have noted the following in the patient's chart:   A. Medical and social history B. Use of alcohol, tobacco or illicit drugs  C. Current medications and supplements D. Functional ability and status E.  Nutritional status F.  Physical activity G. Advance directives and Code Status: Pt advised to bring copies of his Advance Directives for scanning purposes. Pt was provided with copy of the MOST and DNR documents today. Advised to review and complete the appropriate document prior to next OV. H. List of other physicians I.  Hospitalizations, surgeries, and ER visits in previous 12 months J.  [place] such as hearing and vision if needed, cognitive and depression L. Referrals and appointments - none  In addition, I have reviewed and discussed with patient certain preventive protocols, quality metrics, and best practice recommendations. A written personalized care plan for preventive services as well as general preventive health recommendations were provided to patient.  See attached scanned questionnaire for additional information.   Signed,  Aleatha Borer, LPN Nurse Health Advisor   Recommendations for Immunizations per CDC guidelines:  Vaccinations: Influenza vaccine: Declined. Pt advised to bring in a copy of his immunization record  Pneumococcal vaccine: Given Prevnar 13 today Tdap vaccine: Declined. Pt advised to call his insurance company to determine his out of pocket expense. May also obtain this vaccine at his local pharmacy or Health Dept. Shingles vaccine: Declined. Pt advised to call his insurance company to determine his out of pocket expense. May also obtain this vaccine at his local pharmacy or Health Dept.  Recommendations for Health Maintenance Screenings:  Screenings: Colonoscopy: Completed 05/25/13. Repeat colonoscopy in 10 years Hep C Screening: Lab ordered today. Pt given lab requisition. States he will complete this lab with his other routine labs on 11/01/17. Recommended yearly  ophthalmology/optometry visit for glaucoma screening and checkup Recommended yearly dental visit for hygiene and checkup

## 2017-11-01 ENCOUNTER — Ambulatory Visit: Payer: Medicare Other | Admitting: Family Medicine

## 2017-11-01 ENCOUNTER — Encounter: Payer: Self-pay | Admitting: Family Medicine

## 2017-11-01 DIAGNOSIS — E781 Pure hyperglyceridemia: Secondary | ICD-10-CM | POA: Diagnosis not present

## 2017-11-01 LAB — LIPID PANEL
CHOL/HDL RATIO: 2.5 (calc) (ref <5.0)
CHOLESTEROL: 106 mg/dL (ref <200)
HDL: 43 mg/dL (ref >40)
LDL CHOLESTEROL (CALC): 43 mg/dL
NON-HDL CHOLESTEROL (CALC): 63 mg/dL (ref <130)
Triglycerides: 123 mg/dL (ref <150)

## 2017-11-01 MED ORDER — NIACIN ER (ANTIHYPERLIPIDEMIC) 500 MG PO TBCR: 1500.0000 mg | EXTENDED_RELEASE_TABLET | Freq: Every day | ORAL | 3 refills | Status: DC

## 2017-11-01 MED ORDER — ROSUVASTATIN CALCIUM 5 MG PO TABS: 5.0000 mg | ORAL_TABLET | Freq: Every day | ORAL | 0 refills | Status: DC

## 2017-11-01 NOTE — Progress Notes (Signed)
Name: Alan Holt   MRN: [id number]    DOB: June 17, 1951   Date:11/01/2017       Progress Note  Subjective  Chief Complaint  Chief Complaint  Patient presents with  . Follow-up    6 mnths   . Medication Refill    crestor and niacin    Hyperlipidemia  This is a chronic problem. The problem is controlled. Recent lipid tests were reviewed and are normal. Pertinent negatives include no chest pain, leg pain, myalgias or shortness of breath. Current antihyperlipidemic treatment includes statins and nicotinic acid.      Past Medical History:  Diagnosis Date  . Allergy   . Hypertension   . Sleep apnea   . Thyroid disease     Past Surgical History:  Procedure Laterality Date  . HERNIA REPAIR     childhood  . KNEE SURGERY Left 1993  . THYROID SURGERY  2015  . TOTAL THYROIDECTOMY Bilateral 07/15/2014   Dr. Kathyrn Holt.     Family History  Problem Relation Age of Onset  . Diabetes Mother   . Alcohol abuse Father   . Stroke Father   . Diabetes Brother   . Heart attack Brother     Social History   Socioeconomic History  . Marital status: Married    Spouse name: Not on file  . Number of children: Not on file  . Years of education: Not on file  . Highest education level: Not on file  Social Needs  . Financial resource strain: Not on file  . Food insecurity - worry: Not on file  . Food insecurity - inability: Not on file  . Transportation needs - medical: Not on file  . Transportation needs - non-medical: Not on file  Occupational History  . Not on file  Tobacco Use  . Smoking status: Never Smoker  . Smokeless tobacco: Never Used  Substance and Sexual Activity  . Alcohol use: No    Alcohol/week: 0.0 oz  . Drug use: No  . Sexual activity: No  Other Topics Concern  . Not on file  Social History Narrative  . Not on file     Current Outpatient Medications:  .  aspirin 81 MG tablet, Take 81 mg by mouth daily., Disp: , Rfl:  .  ibuprofen (ADVIL,MOTRIN) 600 MG  tablet, Take by mouth., Disp: , Rfl:  .  levothyroxine (SYNTHROID, LEVOTHROID) 137 MCG tablet, Take 1 tablet every other day by mouth. Patient takes this medication on odd days , Disp: , Rfl:  .  levothyroxine (SYNTHROID, LEVOTHROID) 150 MCG tablet, Take 1 tablet every other day by mouth. Patient takes this medication on even days, Disp: , Rfl:  .  niacin (NIASPAN) 500 MG CR tablet, Take 3 tablets (1,500 mg total) by mouth at bedtime., Disp: 270 tablet, Rfl: 3 .  rosuvastatin (CRESTOR) 5 MG tablet, take 1 tablet by mouth at bedtime, Disp: 90 tablet, Rfl: 0  Allergies  Allergen Reactions  . Erythromycin Rash     Review of Systems  Respiratory: Negative for shortness of breath.   Cardiovascular: Negative for chest pain.  Musculoskeletal: Negative for myalgias.     Objective  Vitals:   11/01/17 0832  BP: 122/80  Pulse: 91  Resp: 14  Temp: 97.7 F (36.5 C)  TempSrc: Oral  SpO2: 97%  Weight: 231 lb 6.4 oz (105 kg)  Height: 6\' 1"  (1.854 m)    Physical Exam  Constitutional: He is oriented to person, place, and time and  well-developed, well-nourished, and in no distress.  Cardiovascular: Normal rate, regular rhythm and normal heart sounds.  No murmur heard. Pulmonary/Chest: Effort normal and breath sounds normal. He has no wheezes.  Abdominal: Soft. Bowel sounds are normal. There is no tenderness.  Neurological: He is alert and oriented to person, place, and time.  Psychiatric: Mood, memory, affect and judgment normal.  Nursing note and vitals reviewed.       Assessment & Plan  1. Hypertriglyceridemia Continue on statin and Niaspan, obtain FLP and follow-up - niacin (NIASPAN) 500 MG CR tablet; Take 3 tablets (1,500 mg total) at bedtime by mouth.  Dispense: 270 tablet; Refill: 3 - rosuvastatin (CRESTOR) 5 MG tablet; Take 1 tablet (5 mg total) at bedtime by mouth.  Dispense: 90 tablet; Refill: 0 - Lipid panel   Alan Holt  [facility] 11/01/2017 8:42 AM

## 2017-11-04 ENCOUNTER — Ambulatory Visit: Payer: Medicare Other | Admitting: Family Medicine

## 2017-12-20 ENCOUNTER — Other Ambulatory Visit: Payer: Self-pay | Admitting: Family Medicine

## 2017-12-20 DIAGNOSIS — E781 Pure hyperglyceridemia: Secondary | ICD-10-CM

## 2018-02-01 ENCOUNTER — Encounter: Payer: Self-pay | Admitting: Family Medicine

## 2018-02-01 ENCOUNTER — Ambulatory Visit (INDEPENDENT_AMBULATORY_CARE_PROVIDER_SITE_OTHER): Payer: Medicare Other | Admitting: Family Medicine

## 2018-02-01 VITALS — BP 128/76 | HR 75 | Temp 97.6°F | Resp 14 | Ht 73.0 in | Wt 229.6 lb

## 2018-02-01 DIAGNOSIS — Z125 Encounter for screening for malignant neoplasm of prostate: Secondary | ICD-10-CM | POA: Diagnosis not present

## 2018-02-01 DIAGNOSIS — Z0000 Encounter for general adult medical examination without abnormal findings: Secondary | ICD-10-CM | POA: Diagnosis not present

## 2018-02-01 DIAGNOSIS — Z1159 Encounter for screening for other viral diseases: Secondary | ICD-10-CM | POA: Diagnosis not present

## 2018-02-01 NOTE — Progress Notes (Signed)
Name: Alan Holt   MRN: [id number]    DOB: 1951-05-11   Date:02/01/2018       Progress Note  Subjective  Chief Complaint  Chief Complaint  Patient presents with  . Annual Exam    HPI  Pt. Presents for Complete Physical Exam.  He had colonoscopy in 2014, repeat in 2024. PSA was completed in 2018. He is due for Hep C screening.    Past Medical History:  Diagnosis Date  . Allergy   . Hypertension   . Sleep apnea   . Thyroid disease     Past Surgical History:  Procedure Laterality Date  . HERNIA REPAIR     childhood  . KNEE SURGERY Left 1993  . THYROID SURGERY  2015  . TOTAL THYROIDECTOMY Bilateral 07/15/2014   Dr. Kathyrn Sheriff.     Family History  Problem Relation Age of Onset  . Diabetes Mother   . Alcohol abuse Father   . Stroke Father   . Diabetes Brother   . Heart attack Brother     Social History   Socioeconomic History  . Marital status: Married    Spouse name: Not on file  . Number of children: Not on file  . Years of education: Not on file  . Highest education level: Not on file  Social Needs  . Financial resource strain: Not on file  . Food insecurity - worry: Not on file  . Food insecurity - inability: Not on file  . Transportation needs - medical: Not on file  . Transportation needs - non-medical: Not on file  Occupational History  . Not on file  Tobacco Use  . Smoking status: Never Smoker  . Smokeless tobacco: Never Used  Substance and Sexual Activity  . Alcohol use: No    Alcohol/week: 0.0 oz  . Drug use: No  . Sexual activity: No  Other Topics Concern  . Not on file  Social History Narrative  . Not on file     Current Outpatient Medications:  .  aspirin 81 MG tablet, Take 81 mg by mouth daily., Disp: , Rfl:  .  calcium citrate (CALCITRATE - DOSED IN MG ELEMENTAL CALCIUM) 950 MG tablet, Take 1,200 mg of elemental calcium by mouth daily., Disp: , Rfl:  .  cholecalciferol (VITAMIN D) 1000 units tablet, Take 1,000 Units by mouth  daily., Disp: , Rfl:  .  ibuprofen (ADVIL,MOTRIN) 600 MG tablet, Take 600 mg by mouth every 8 (eight) hours as needed. , Disp: , Rfl:  .  levothyroxine (SYNTHROID, LEVOTHROID) 137 MCG tablet, Take 1 tablet every other day by mouth. Patient takes this medication on odd days , Disp: , Rfl:  .  levothyroxine (SYNTHROID, LEVOTHROID) 150 MCG tablet, Take 1 tablet every other day by mouth. Patient takes this medication on even days, Disp: , Rfl:  .  niacin (NIASPAN) 500 MG CR tablet, Take 3 tablets (1,500 mg total) at bedtime by mouth., Disp: 270 tablet, Rfl: 3 .  rosuvastatin (CRESTOR) 5 MG tablet, TAKE 1 TABLET BY MOUTH AT  BEDTIME, Disp: 90 tablet, Rfl: 0  Allergies  Allergen Reactions  . Erythromycin Rash     Review of Systems  Constitutional: Negative for chills, fever, malaise/fatigue and weight loss.  HENT: Negative for congestion, ear pain and sinus pain.   Eyes: Negative for blurred vision and double vision.  Respiratory: Negative for cough, sputum production and shortness of breath.   Cardiovascular: Negative for chest pain and palpitations.  Gastrointestinal: Negative for  abdominal pain, blood in stool, constipation, diarrhea, nausea and vomiting.  Genitourinary: Negative for hematuria.  Musculoskeletal: Negative for back pain and neck pain.  Skin: Negative for rash.  Neurological: Negative for dizziness and headaches.  Psychiatric/Behavioral: Negative for depression. The patient is not nervous/anxious and does not have insomnia.      Objective  Vitals:   02/01/18 0902  BP: 128/76  Pulse: 75  Resp: 14  Temp: 97.6 F (36.4 C)  TempSrc: Oral  SpO2: 98%  Weight: 229 lb 9.6 oz (104.1 kg)  Height: 6\' 1"  (1.854 m)    Physical Exam  Constitutional: He is oriented to person, place, and time and well-developed, well-nourished, and in no distress.  HENT:  Head: Normocephalic and atraumatic.  Cardiovascular: Normal rate, regular rhythm and normal heart sounds.  No murmur  heard. Pulmonary/Chest: Effort normal and breath sounds normal. He has no wheezes.  Abdominal: Soft. Bowel sounds are normal. There is no tenderness.  Genitourinary: Prostate normal. Rectal exam shows external hemorrhoid. Rectal exam shows no tenderness. Prostate is not tender.  Musculoskeletal: Normal range of motion. He exhibits no edema.  Neurological: He is alert and oriented to person, place, and time.  Skin: Skin is warm, dry and intact.  Psychiatric: Mood, memory, affect and judgment normal.  Nursing note and vitals reviewed.     Assessment & Plan  1. Encounter for annual physical exam  - CBC with Differential/Platelet - COMPLETE METABOLIC PANEL WITH GFR - VITAMIN D 25 Hydroxy (Vit-D Deficiency, Fractures)  2. Screening for prostate cancer  - PSA  3. Need for hepatitis C screening test  - Hepatitis C antibody   Alan Holt [facility] 02/01/2018 9:16 AM

## 2018-02-02 LAB — COMPLETE METABOLIC PANEL WITH GFR
AG Ratio: 2.2 (calc) (ref 1.0–2.5)
ALBUMIN MSPROF: 4.7 g/dL (ref 3.6–5.1)
ALKALINE PHOSPHATASE (APISO): 82 U/L (ref 40–115)
ALT: 34 U/L (ref 9–46)
AST: 25 U/L (ref 10–35)
BILIRUBIN TOTAL: 0.7 mg/dL (ref 0.2–1.2)
BUN: 19 mg/dL (ref 7–25)
CHLORIDE: 108 mmol/L (ref 98–110)
CO2: 24 mmol/L (ref 20–32)
CREATININE: 0.93 mg/dL (ref 0.70–1.25)
Calcium: 9.4 mg/dL (ref 8.6–10.3)
GFR, Est African American: 98 mL/min/{1.73_m2} (ref >=60)
GFR, Est Non African American: 85 mL/min/{1.73_m2} (ref >=60)
GLUCOSE: 105 mg/dL — AB (ref 65–99)
Globulin: 2.1 g/dL (calc) (ref 1.9–3.7)
Potassium: 3.9 mmol/L (ref 3.5–5.3)
SODIUM: 141 mmol/L (ref 135–146)
Total Protein: 6.8 g/dL (ref 6.1–8.1)

## 2018-02-02 LAB — PSA: PSA: 2.2 ng/mL (ref <=4.0)

## 2018-02-02 LAB — HEPATITIS C ANTIBODY
HEP C AB: NONREACTIVE
SIGNAL TO CUT-OFF: 0.01 (ref <1.00)

## 2018-02-02 LAB — CBC WITH DIFFERENTIAL/PLATELET
BASOS ABS: 39 {cells}/uL (ref 0–200)
Basophils Relative: 0.6 %
EOS ABS: 91 {cells}/uL (ref 15–500)
EOS PCT: 1.4 %
HCT: 47.4 % (ref 38.5–50.0)
Hemoglobin: 16.7 g/dL (ref 13.2–17.1)
Lymphs Abs: 1996 cells/uL (ref 850–3900)
MCH: 30.9 pg (ref 27.0–33.0)
MCHC: 35.2 g/dL (ref 32.0–36.0)
MCV: 87.8 fL (ref 80.0–100.0)
MONOS PCT: 6.9 %
MPV: 9.2 fL (ref 7.5–12.5)
NEUTROS PCT: 60.4 %
Neutro Abs: 3926 cells/uL (ref 1500–7800)
Platelets: 245 10*3/uL (ref 140–400)
RBC: 5.4 10*6/uL (ref 4.20–5.80)
RDW: 13.1 % (ref 11.0–15.0)
TOTAL LYMPHOCYTE: 30.7 %
WBC mixed population: 449 cells/uL (ref 200–950)
WBC: 6.5 10*3/uL (ref 3.8–10.8)

## 2018-02-02 LAB — VITAMIN D 25 HYDROXY (VIT D DEFICIENCY, FRACTURES): VIT D 25 HYDROXY: 51 ng/mL (ref 30–100)

## 2018-03-14 ENCOUNTER — Other Ambulatory Visit: Payer: Self-pay

## 2018-03-14 DIAGNOSIS — E781 Pure hyperglyceridemia: Secondary | ICD-10-CM

## 2018-03-15 MED ORDER — ROSUVASTATIN CALCIUM 5 MG PO TABS: 5.0000 mg | ORAL_TABLET | Freq: Every day | ORAL | 3 refills | Status: DC

## 2018-03-15 NOTE — Telephone Encounter (Signed)
Normal sgpt, last LDL excellent; Rx approved

## 2018-04-05 HISTORY — PX: MOUTH SURGERY: SHX715

## 2018-05-08 ENCOUNTER — Encounter: Payer: Self-pay | Admitting: Family Medicine

## 2018-05-08 ENCOUNTER — Ambulatory Visit: Payer: Medicare Other | Admitting: Family Medicine

## 2018-05-08 VITALS — BP 136/80 | HR 85 | Temp 97.6°F | Resp 14 | Ht 73.0 in | Wt 237.2 lb

## 2018-05-08 DIAGNOSIS — R739 Hyperglycemia, unspecified: Secondary | ICD-10-CM | POA: Diagnosis not present

## 2018-05-08 DIAGNOSIS — E669 Obesity, unspecified: Secondary | ICD-10-CM

## 2018-05-08 DIAGNOSIS — E785 Hyperlipidemia, unspecified: Secondary | ICD-10-CM

## 2018-05-08 DIAGNOSIS — Z5181 Encounter for therapeutic drug level monitoring: Secondary | ICD-10-CM

## 2018-05-08 DIAGNOSIS — R972 Elevated prostate specific antigen [PSA]: Secondary | ICD-10-CM

## 2018-05-08 DIAGNOSIS — R748 Abnormal levels of other serum enzymes: Secondary | ICD-10-CM

## 2018-05-08 DIAGNOSIS — E786 Lipoprotein deficiency: Secondary | ICD-10-CM | POA: Diagnosis not present

## 2018-05-08 DIAGNOSIS — E039 Hypothyroidism, unspecified: Secondary | ICD-10-CM | POA: Diagnosis not present

## 2018-05-08 NOTE — Assessment & Plan Note (Addendum)
Check fasting lipids today; continue statin

## 2018-05-08 NOTE — Assessment & Plan Note (Signed)
Stable, managed by ENT

## 2018-05-08 NOTE — Assessment & Plan Note (Signed)
He'll try to lose back down to around 225 pounds

## 2018-05-08 NOTE — Assessment & Plan Note (Signed)
On niacin; discussed the debate over niacin; jogging

## 2018-05-08 NOTE — Patient Instructions (Signed)
Try to follow the DASH guidelines (DASH stands for Dietary Approaches to Stop Hypertension). Try to limit the sodium in your diet to no more than 1,500mg of sodium per day. Certainly try to not exceed 2,000 mg per day at the very most. Do not add salt when cooking or at the table.  Check the sodium amount on labels when shopping, and choose items lower in sodium when given a choice. Avoid or limit foods that already contain a lot of sodium. Eat a diet rich in fruits and vegetables and whole grains, and try to lose weight if overweight or obese Try to limit saturated fats in your diet (bologna, hot dogs, barbeque, cheeseburgers, hamburgers, steak, bacon, sausage, cheese, etc.) and get more fresh fruits, vegetables, and whole grains  

## 2018-05-08 NOTE — Assessment & Plan Note (Signed)
Check glucose and A1c 

## 2018-05-08 NOTE — Progress Notes (Signed)
BP 136/80   Pulse 85   Temp 97.6 F (36.4 C) (Oral)   Resp 14   Ht 6\' 1"  (1.854 m)   Wt 237 lb 3.2 oz (107.6 kg)   SpO2 96%   BMI 31.29 kg/m    Subjective:    Patient ID: Alan Holt, male    DOB: 05-21-1951, 67 y.o.   MRN: [id number]  HPI: Alan Holt is a 67 y.o. male  Chief Complaint  Patient presents with  . Follow-up    Former [facility]ah pt  . Medication Refill    HPI Patient is new to me; previous provider left our practice  Thyroid removed in 04-03-14 by Dr. Kathyrn Sheriff, enlarged with calcification; he made a decision to take it out; no cancer; stable on the current dose; energy level is level; BMs moving; Dr. Kathyrn Sheriff continues to monitor  High cholesterol; brother had high cholesterol; parents too; one brother had diabetes, broke his hip and had series of heart attacks for a month, then passed away; around April 03, 2002; other brother had cerebral amyloid angiopathy and prostate cancer; passed away in 04-04-07; on medicine, including statin; tries to limit saturated fats; tries to get rice chex with fruit for breakfast, wife might do toast and sausage or bacon; tries to do salads with salmon 1x a week; jogger now; on niacin since before Dr. Jacqualine Code to get the good cholesterol up, rarely above 40; 44 last time he checked; as low as 27  Hyperglycemia; just over 100; some dry mouth but had oral surgery; has a flipper, 3 teeth pulled, and it's temporary while it heals, then gets dental implant; avoiding white flour  Gets prostate checked with physical; no change in symptoms; not sure about father and prostate issues  Obesity per BMI; he thinks 220 pounds was his normal; went to 240 pounds a few month after the thyroid surgery; got down to 230 pounds  Depression screen [facility] 2/9 05/08/2018 02/01/2018 11/01/2017 10/26/2017 05/03/2017  Decreased Interest 0 0 0 0 0  Down, Depressed, Hopeless 0 0 0 0 0  PHQ - 2 Score 0 0 0 0 0    Relevant past medical, surgical, family and social history reviewed Past  Medical History:  Diagnosis Date  . Allergy   . Hypertension   . Sleep apnea   . Thyroid disease    Past Surgical History:  Procedure Laterality Date  . HERNIA REPAIR     childhood  . KNEE SURGERY Left 1993  . MOUTH SURGERY  04/05/2018   bone graft, 3 teeth pulled  . THYROID SURGERY  04-03-14  . TOTAL THYROIDECTOMY Bilateral 07/15/2014   Dr. Kathyrn Sheriff.    Family History  Problem Relation Age of Onset  . Diabetes Mother   . Alcohol abuse Father   . Stroke Father   . Diabetes Brother   . Heart attack Brother    Social History   Tobacco Use  . Smoking status: Never Smoker  . Smokeless tobacco: Never Used  Substance Use Topics  . Alcohol use: No    Alcohol/week: 0.0 oz  . Drug use: No    Interim medical history since last visit reviewed. Allergies and medications reviewed  Review of Systems Per HPI unless specifically indicated above     Objective:    BP 136/80   Pulse 85   Temp 97.6 F (36.4 C) (Oral)   Resp 14   Ht 6\' 1"  (1.854 m)   Wt 237 lb 3.2 oz (107.6 kg)  SpO2 96%   BMI 31.29 kg/m   Wt Readings from Last 3 Encounters:  05/08/18 237 lb 3.2 oz (107.6 kg)  02/01/18 229 lb 9.6 oz (104.1 kg)  11/01/17 231 lb 6.4 oz (105 kg)    Physical Exam  Constitutional: He appears well-developed and well-nourished. No distress.  HENT:  Head: Normocephalic and atraumatic.  Eyes: EOM are normal. No scleral icterus.  Neck: No thyromegaly present.  Cardiovascular: Normal rate and regular rhythm.  Pulmonary/Chest: Effort normal and breath sounds normal.  Abdominal: Soft. Bowel sounds are normal. He exhibits no distension.  Musculoskeletal: He exhibits no edema.  Neurological: Coordination normal.  Skin: Skin is warm and dry. No pallor.  Psychiatric: He has a normal mood and affect. His behavior is normal. Judgment and thought content normal.      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 Sanda Klein; physical when due.  An after-visit summary was printed and given to the patient at [place].  Please see the patient instructions which may contain other information and recommendations beyond what is mentioned above in the assessment and plan.  No orders of the defined types were placed in this encounter.   Orders Placed This Encounter  Procedures  . Comprehensive metabolic panel  . Hemoglobin A1c  . Lipid panel  . PSA

## 2018-05-08 NOTE — Assessment & Plan Note (Signed)
Check PSA today. 

## 2018-05-09 LAB — LIPID PANEL
CHOLESTEROL: 119 mg/dL (ref <200)
HDL: 39 mg/dL — ABNORMAL LOW (ref >40)
LDL Cholesterol (Calc): 55 mg/dL (calc)
Non-HDL Cholesterol (Calc): 80 mg/dL (calc) (ref <130)
Total CHOL/HDL Ratio: 3.1 (calc) (ref <5.0)
Triglycerides: 172 mg/dL — ABNORMAL HIGH (ref <150)

## 2018-05-09 LAB — COMPREHENSIVE METABOLIC PANEL
AG RATIO: 2.2 (calc) (ref 1.0–2.5)
ALT: 40 U/L (ref 9–46)
AST: 31 U/L (ref 10–35)
Albumin: 4.8 g/dL (ref 3.6–5.1)
Alkaline phosphatase (APISO): 77 U/L (ref 40–115)
BUN: 14 mg/dL (ref 7–25)
CHLORIDE: 106 mmol/L (ref 98–110)
CO2: 28 mmol/L (ref 20–32)
CREATININE: 0.97 mg/dL (ref 0.70–1.25)
Calcium: 9.8 mg/dL (ref 8.6–10.3)
GLOBULIN: 2.2 g/dL (ref 1.9–3.7)
GLUCOSE: 103 mg/dL — AB (ref 65–99)
Potassium: 3.7 mmol/L (ref 3.5–5.3)
SODIUM: 141 mmol/L (ref 135–146)
TOTAL PROTEIN: 7 g/dL (ref 6.1–8.1)
Total Bilirubin: 0.8 mg/dL (ref 0.2–1.2)

## 2018-05-09 LAB — PSA: PSA: 1.6 ng/mL (ref <=4.0)

## 2018-05-09 LAB — HEMOGLOBIN A1C
EAG (MMOL/L): 6.5 (calc)
Hgb A1c MFr Bld: 5.7 % of total Hgb — ABNORMAL HIGH (ref <5.7)
MEAN PLASMA GLUCOSE: 117 (calc)

## 2018-05-10 ENCOUNTER — Encounter: Payer: Self-pay | Admitting: Family Medicine

## 2018-05-10 DIAGNOSIS — R7303 Prediabetes: Secondary | ICD-10-CM

## 2018-05-10 HISTORY — DX: Prediabetes: R73.03

## 2018-06-01 DIAGNOSIS — G4730 Sleep apnea, unspecified: Secondary | ICD-10-CM | POA: Insufficient documentation

## 2018-06-01 DIAGNOSIS — G4733 Obstructive sleep apnea (adult) (pediatric): Secondary | ICD-10-CM | POA: Insufficient documentation

## 2018-09-08 ENCOUNTER — Other Ambulatory Visit: Payer: Self-pay

## 2018-09-08 DIAGNOSIS — E781 Pure hyperglyceridemia: Secondary | ICD-10-CM

## 2018-09-08 MED ORDER — NIACIN ER (ANTIHYPERLIPIDEMIC) 500 MG PO TBCR: 1500.0000 mg | EXTENDED_RELEASE_TABLET | Freq: Every day | ORAL | 0 refills | Status: DC

## 2018-11-09 ENCOUNTER — Ambulatory Visit: Payer: Medicare Other | Admitting: Family Medicine

## 2018-11-09 ENCOUNTER — Encounter: Payer: Self-pay | Admitting: Family Medicine

## 2018-11-09 VITALS — BP 134/78 | HR 78 | Temp 98.0°F | Ht 73.0 in | Wt 232.8 lb

## 2018-11-09 DIAGNOSIS — E039 Hypothyroidism, unspecified: Secondary | ICD-10-CM

## 2018-11-09 DIAGNOSIS — R748 Abnormal levels of other serum enzymes: Secondary | ICD-10-CM

## 2018-11-09 DIAGNOSIS — E786 Lipoprotein deficiency: Secondary | ICD-10-CM

## 2018-11-09 DIAGNOSIS — Z23 Encounter for immunization: Secondary | ICD-10-CM

## 2018-11-09 DIAGNOSIS — E785 Hyperlipidemia, unspecified: Secondary | ICD-10-CM

## 2018-11-09 DIAGNOSIS — R7303 Prediabetes: Secondary | ICD-10-CM | POA: Diagnosis not present

## 2018-11-09 NOTE — Progress Notes (Signed)
BP 134/78   Pulse 78   Temp 98 F (36.7 C)   Ht 6\' 1"  (1.854 m)   Wt 232 lb 12.8 oz (105.6 kg)   SpO2 98%   BMI 30.71 kg/m    Subjective:    Patient ID: Alan Holt, male    DOB: May 07, 1951, 67 y.o.   MRN: [id number]  HPI: Alan Holt is a 67 y.o. male  Chief Complaint  Patient presents with  . Follow-up    HPI Patient is here for f/u  High cholesterol; on rosuvastatin and niaspan; occasional hot flashes with niacin; once every six weeks; that's it, not bad, just a few minutes, feels flushed; very rare; tries to watch a good diet; low HDL which is long-term; highest he has ever seen it was 44; always low; sounds inherited; jogs, active  Lab Results  Component Value Date   CHOL 119 05/08/2018   HDL 39 (L) 05/08/2018   LDLCALC 55 05/08/2018   TRIG 172 (H) 05/08/2018   CHOLHDL 3.1 05/08/2018   Prediabetes category just barely; not much dry mouth, but did have oral procedures; nothing to speak of; no blurred visions; mother had DM as well as brother  Hypothyroidism status thyroidectomy; managed by Dr. Kathyrn Sheriff, ENT; removed in 2015; saw June 2019 and will go back June 2020; weight is stable; he went from 220 to 240 pounds after the 2015 surgery; hovering now 228 to 233; moving bowels okay, hair and skin okay  Taking ibuprofen 600 mg, just for knee pain; every 2-3 weeks and not often  PSA readings reviewed; it had bumped up but then came back down; no sx (will recheck in May)  Depression screen [facility] 2/9 11/09/2018 05/08/2018 02/01/2018 11/01/2017 10/26/2017  Decreased Interest 0 0 0 0 0  Down, Depressed, Hopeless 0 0 0 0 0  PHQ - 2 Score 0 0 0 0 0  Altered sleeping 0 - - - -  Tired, decreased energy 0 - - - -  Change in appetite 0 - - - -  Feeling bad or failure about yourself  0 - - - -  Trouble concentrating 0 - - - -  Moving slowly or fidgety/restless 0 - - - -  Suicidal thoughts 0 - - - -  PHQ-9 Score 0 - - - -  Difficult doing work/chores Not difficult at all -  - - -   Fall Risk  11/09/2018 05/08/2018 02/01/2018 11/01/2017 10/26/2017  Falls in the past year? 0 No No No No    Relevant past medical, surgical, family and social history reviewed Past Medical History:  Diagnosis Date  . Allergy   . Hypertension   . Prediabetes 05/10/2018  . Sleep apnea   . Thyroid disease    Past Surgical History:  Procedure Laterality Date  . HERNIA REPAIR     childhood  . KNEE SURGERY Left 1993  . MOUTH SURGERY  04/05/2018   bone graft, 3 teeth pulled  . THYROID SURGERY  2015  . TOTAL THYROIDECTOMY Bilateral 07/15/2014   Dr. Kathyrn Sheriff.    Family History  Problem Relation Age of Onset  . Diabetes Mother   . Alcohol abuse Father   . Stroke Father   . Diabetes Brother   . Heart attack Brother    Social History   Tobacco Use  . Smoking status: Never Smoker  . Smokeless tobacco: Never Used  Substance Use Topics  . Alcohol use: No    Alcohol/week: 0.0 standard drinks  .  Drug use: No     [facility]/09/[facility] in [facility]  AUDIT-C Score  0     Interim medical history since last visit reviewed. Allergies and medications reviewed  Review of Systems Per HPI unless specifically indicated above     Objective:    BP 134/78   Pulse 78   Temp 98 F (36.7 C)   Ht 6\' 1"  (1.854 m)   Wt 232 lb 12.8 oz (105.6 kg)   SpO2 98%   BMI 30.71 kg/m   Wt Readings from Last 3 Encounters:  11/09/18 232 lb 12.8 oz (105.6 kg)  05/08/18 237 lb 3.2 oz (107.6 kg)  02/01/18 229 lb 9.6 oz (104.1 kg)    Physical Exam  Constitutional: He appears well-developed and well-nourished. No distress.  Eyes: No scleral icterus.  Cardiovascular: Normal rate and regular rhythm.  Pulmonary/Chest: Effort normal and breath sounds normal.  Neurological: He is alert.  Skin: No pallor.  Psychiatric: He has a normal mood and affect.   Results for orders placed or performed in visit on 05/08/18  Comprehensive metabolic panel  Result Value Ref Range     Glucose, Bld 103 (H) 65 - 99 mg/dL   BUN 14 7 - 25 mg/dL   Creat 0.97 0.70 - 1.25 mg/dL   BUN/Creatinine Ratio NOT APPLICABLE 6 - 22 (calc)   Sodium 141 135 - 146 mmol/L   Potassium 3.7 3.5 - 5.3 mmol/L   Chloride 106 98 - 110 mmol/L   CO2 28 20 - 32 mmol/L   Calcium 9.8 8.6 - 10.3 mg/dL   Total Protein 7.0 6.1 - 8.1 g/dL   Albumin 4.8 3.6 - 5.1 g/dL   Globulin 2.2 1.9 - 3.7 g/dL (calc)   AG Ratio 2.2 1.0 - 2.5 (calc)   Total Bilirubin 0.8 0.2 - 1.2 mg/dL   Alkaline phosphatase (APISO) 77 40 - 115 U/L   AST 31 10 - 35 U/L   ALT 40 9 - 46 U/L  Hemoglobin A1c  Result Value Ref Range   Hgb A1c MFr Bld 5.7 (H) <5.7 % of total Hgb   Mean Plasma Glucose 117 (calc)   eAG (mmol/L) 6.5 (calc)  Lipid panel  Result Value Ref Range   Cholesterol 119 <200 mg/dL   HDL 39 (L) >40 mg/dL   Triglycerides 172 (H) <150 mg/dL   LDL Cholesterol (Calc) 55 mg/dL (calc)   Total CHOL/HDL Ratio 3.1 <5.0 (calc)   Non-HDL Cholesterol (Calc) 80 <130 mg/dL (calc)  PSA  Result Value Ref Range   PSA 1.6 < OR = 4.0 ng/mL      Assessment & Plan:   Problem List Items Addressed This Visit      Endocrine   Adult hypothyroidism    Managed by ENT, Dr. Kathyrn Sheriff        Other   Low serum HDL - Primary   Relevant Orders   Lipid panel   Prediabetes    Check A1c; family hx (+); he has lost some weight, staying active      Relevant Orders   Hemoglobin A1c   Hyperlipidemia with low HDL (Chronic)    Healthy eating, activity; check labs; continue meds      Relevant Orders   Lipid panel    Other Visit Diagnoses    Need for 23-polyvalent pneumococcal polysaccharide vaccine       Relevant Orders   Pneumococcal polysaccharide vaccine 23-valent greater than or equal to 2yo subcutaneous/IM (Completed)  Follow up plan: Return in about 6 months (around 05/10/2019) for follow-up visit with Dr. Sanda Klein.  An after-visit summary was printed and given to the patient at [place].  Please see the patient  instructions which may contain other information and recommendations beyond what is mentioned above in the assessment and plan.  No orders of the defined types were placed in this encounter.   Orders Placed This Encounter  Procedures  . Pneumococcal polysaccharide vaccine 23-valent greater than or equal to 2yo subcutaneous/IM  . Hemoglobin A1c  . Lipid panel

## 2018-11-09 NOTE — Assessment & Plan Note (Signed)
Check A1c; family hx (+); he has lost some weight, staying active

## 2018-11-09 NOTE — Patient Instructions (Addendum)
If you do happen to take aspirin and ibuprofen on the same day, make sure you have the aspirin first, at least an hour before the ibuprofen Try to limit saturated fats in your diet (bologna, hot dogs, barbeque, cheeseburgers, hamburgers, steak, bacon, sausage, cheese, etc.) and get more fresh fruits, vegetables, and whole grains  You have received the Pneumovax vaccine (PPSV-23) and you will not need another booster of this for the rest of your life per current ACIP guidelines

## 2018-11-09 NOTE — Assessment & Plan Note (Signed)
Managed by ENT, Dr. Kathyrn Sheriff

## 2018-11-09 NOTE — Assessment & Plan Note (Signed)
Healthy eating, activity; check labs; continue meds

## 2018-11-10 ENCOUNTER — Other Ambulatory Visit: Payer: Self-pay | Admitting: Family Medicine

## 2018-11-10 DIAGNOSIS — E781 Pure hyperglyceridemia: Secondary | ICD-10-CM

## 2018-11-10 LAB — LIPID PANEL
CHOLESTEROL: 104 mg/dL (ref <200)
HDL: 43 mg/dL (ref >40)
LDL CHOLESTEROL (CALC): 42 mg/dL
Non-HDL Cholesterol (Calc): 61 mg/dL (calc) (ref <130)
TRIGLYCERIDES: 104 mg/dL (ref <150)
Total CHOL/HDL Ratio: 2.4 (calc) (ref <5.0)

## 2018-11-10 LAB — HEMOGLOBIN A1C
HEMOGLOBIN A1C: 5.6 %{Hb} (ref <5.7)
Mean Plasma Glucose: 114 (calc)
eAG (mmol/L): 6.3 (calc)

## 2018-11-10 MED ORDER — NIACIN ER (ANTIHYPERLIPIDEMIC) 500 MG PO TBCR: 1500.0000 mg | EXTENDED_RELEASE_TABLET | Freq: Every day | ORAL | 1 refills | Status: DC

## 2018-11-10 NOTE — Progress Notes (Signed)
Refilled the niacin

## 2019-01-31 ENCOUNTER — Telehealth: Payer: Self-pay | Admitting: Family Medicine

## 2019-01-31 NOTE — Telephone Encounter (Signed)
I left a message asking the patient to call and schedule Medicare AWV with Nurse Health Advisor, [place].  If the patient calls back, please schedule Medicare Wellness Visit with Nurse Health Advisor. Last AWV 10/26/17. VDM (DD)

## 2019-02-01 NOTE — Telephone Encounter (Signed)
Pt has been scheduled.  °

## 2019-02-06 ENCOUNTER — Ambulatory Visit (INDEPENDENT_AMBULATORY_CARE_PROVIDER_SITE_OTHER): Payer: Medicare Other

## 2019-02-06 VITALS — BP 132/82 | HR 70 | Temp 97.9°F | Resp 16 | Ht 73.0 in | Wt 243.3 lb

## 2019-02-06 DIAGNOSIS — Z0000 Encounter for general adult medical examination without abnormal findings: Secondary | ICD-10-CM | POA: Diagnosis not present

## 2019-02-06 NOTE — Progress Notes (Signed)
Subjective:   Alan Holt is a 68 y.o. male who presents for Medicare Annual/Subsequent preventive examination.  Review of Systems:   Cardiac Risk Factors include: advanced age (>40men, >60 women);dyslipidemia;male gender;obesity (BMI >30kg/m2)     Objective:    Vitals: BP 132/82 (BP Location: Right Arm, Patient Position: Sitting, Cuff Size: Large)   Pulse 70   Temp 97.9 F (36.6 C) (Oral)   Resp 16   Ht 6\' 1"  (1.854 m)   Wt 243 lb 4.8 oz (110.4 kg)   SpO2 94%   BMI 32.10 kg/m   Body mass index is 32.1 kg/m.  Advanced Directives 02/06/2019 10/26/2017 05/03/2017 02/01/2017 11/10/2016 08/10/2016 07/20/2016  Does Patient Have a Medical Advance Directive? No Yes No No No No No  Type of Advance Directive - Healthcare Power of [place];Living will - - - - -  Does patient want to make changes to medical advance directive? - - - - - - -  Copy of [place] in Chart? - No - copy requested - - - - -  Would patient like information on creating a medical advance directive? Yes (MAU/Ambulatory/Procedural Areas - Information given) - - - No - patient declined information No - patient declined information No - patient declined information    Tobacco Social History   Tobacco Use  Smoking Status Never Smoker  Smokeless Tobacco Never Used     Counseling given: Not Answered   Clinical Intake:  Pre-visit preparation completed: Yes  Pain : No/denies pain     BMI - recorded: 32.1 Nutritional Status: BMI > 30  Obese Nutritional Risks: None Diabetes: No  How often do you need to have someone help you when you read instructions, pamphlets, or other written materials from your doctor or pharmacy?: 1 - Never What is the last grade level you completed in school?: master's degree  Interpreter Needed?: No  Information entered by :: Clemetine Marker LPN  Past Medical History:  Diagnosis Date  . Allergy   . Hypertension   . Prediabetes 05/10/2018  . Sleep apnea   .  Thyroid disease    Past Surgical History:  Procedure Laterality Date  . HERNIA REPAIR     childhood  . KNEE SURGERY Left 1993  . MOUTH SURGERY  04/05/2018   bone graft, 3 teeth pulled  . THYROID SURGERY  2015  . TOTAL THYROIDECTOMY Bilateral 07/15/2014   Dr. Kathyrn Sheriff.    Family History  Problem Relation Age of Onset  . Diabetes Mother   . Alcohol abuse Father   . Stroke Father   . Diabetes Brother   . Heart attack Brother    Social History   Socioeconomic History  . Marital status: Married    Spouse name: Not on file  . Number of children: Not on file  . Years of education: Not on file  . Highest education level: Master's degree (e.g., MA, MS, MEng, MEd, MSW, MBA)  Occupational History    Comment: retired PE teacher  Social Needs  . Financial resource strain: Not hard at all  . Food insecurity:    Worry: Never true    Inability: Never true  . Transportation needs:    Medical: No    Non-medical: No  Tobacco Use  . Smoking status: Never Smoker  . Smokeless tobacco: Never Used  Substance and Sexual Activity  . Alcohol use: No    Alcohol/week: 0.0 standard drinks  . Drug use: No  . Sexual activity: Not  Currently  Lifestyle  . Physical activity:    Days per week: 5 days    Minutes per session: 30 min  . Stress: Not at all  Relationships  . Social connections:    Talks on phone: More than three times a week    Gets together: Three times a week    Attends religious service: More than 4 times per year    Active member of club or organization: No    Attends meetings of clubs or organizations: Never    Relationship status: Married  Other Topics Concern  . Not on file  Social History Narrative  . Not on file    Outpatient Encounter Medications as of 02/06/2019  Medication Sig  . aspirin 81 MG tablet Take 81 mg by mouth daily.  . calcium citrate (CALCITRATE - DOSED IN MG ELEMENTAL CALCIUM) 950 MG tablet Take 1,200 mg of elemental calcium by mouth daily.  .  cholecalciferol (VITAMIN D) 1000 units tablet Take 1,000 Units by mouth daily.  Marland Kitchen ibuprofen (ADVIL,MOTRIN) 600 MG tablet Take 600 mg by mouth every 8 (eight) hours as needed. Pt takes rarely, 1-2 times per week  . levothyroxine (SYNTHROID, LEVOTHROID) 137 MCG tablet Take 1 tablet every other day by mouth. Patient takes this medication on odd days   . levothyroxine (SYNTHROID, LEVOTHROID) 150 MCG tablet Take 1 tablet every other day by mouth. Patient takes this medication on even days  . niacin (NIASPAN) 500 MG CR tablet Take 3 tablets (1,500 mg total) by mouth at bedtime.  . rosuvastatin (CRESTOR) 5 MG tablet Take 1 tablet (5 mg total) by mouth at bedtime.   No facility-administered encounter medications on file as of 02/06/2019.     Activities of Daily Living In your present state of health, do you have any difficulty performing the following activities: 02/06/2019 11/09/2018  Hearing? N N  Comment declines hearing aids -  Vision? N N  Comment wears glasses -  Difficulty concentrating or making decisions? N N  Walking or climbing stairs? N N  Dressing or bathing? N N  Doing errands, shopping? N N  Preparing Food and eating ? N -  Using the Toilet? N -  In the past six months, have you accidently leaked urine? N -  Do you have problems with loss of bowel control? N -  Managing your Medications? N -  Managing your Finances? N -  Housekeeping or managing your Housekeeping? N -  Some recent data might be hidden    Patient Care Team: Lada, Satira Anis, MD as PCP - General (Family Medicine) Margaretha Sheffield, MD as Consulting Physician (Otolaryngology)   Assessment:   This is a routine wellness examination for Damin.  Exercise Activities and Dietary recommendations Current Exercise Habits: Home exercise routine, Type of exercise: Other - see comments;strength training/weights(jogging), Time (Minutes): 30, Frequency (Times/Week): 5, Weekly Exercise (Minutes/Week): 150, Intensity: Moderate,  Exercise limited by: None identified  Goals    . Exercise 150 minutes per week (moderate activity)     Continue to remain active by jogging, or doing other strengthening exercises for a minimum of 150 minutes per week.       Fall Risk Fall Risk  02/06/2019 11/09/2018 05/08/2018 02/01/2018 11/01/2017  Falls in the past year? 0 0 No No No  Number falls in past yr: 0 - - - -  Injury with Fall? 0 - - - -  Follow up Falls prevention discussed - - - -   FALL RISK  PREVENTION PERTAINING TO THE HOME:  Any stairs in or around the home? No  If so, are they are without handrails? No   Home free of loose throw rugs in walkways, pet beds, electrical cords, etc? Yes  Adequate lighting in your home to reduce risk of falls? Yes   ASSISTIVE DEVICES UTILIZED TO PREVENT FALLS:  Life alert? No  Use of a cane, walker or w/c? No  Grab bars in the bathroom? No  Shower chair or bench in shower? No  Elevated toilet seat or a handicapped toilet? No   DME ORDERS:  DME order needed?  No   TIMED UP AND GO:  Was the test performed? Yes .  Length of time to ambulate 10 feet: 5 sec.   GAIT:  Appearance of gait: Gait stead-fast and without the use of an assistive device.   Education: Fall risk prevention has been discussed.  Intervention(s) required? No   Depression Screen PHQ 2/9 Scores 02/06/2019 11/09/2018 05/08/2018 02/01/2018  PHQ - 2 Score 0 0 0 0  PHQ- 9 Score - 0 - -    Cognitive Function     6CIT Screen 02/06/2019  What Year? 0 points  What month? 0 points  What time? 0 points  Count back from 20 0 points  Months in reverse 0 points  Repeat phrase 0 points  Total Score 0    Immunization History  Administered Date(s) Administered  . Influenza, High Dose Seasonal PF 10/23/2018  . Influenza-Unspecified 09/27/2015, 10/05/2016  . Pneumococcal Conjugate-13 10/26/2017  . Pneumococcal Polysaccharide-23 11/09/2018  . Tdap 11/08/2014    Qualifies for Shingles Vaccine? Yes . Due for  Shingrix. Education has been provided regarding the importance of this vaccine. Pt has been advised to call insurance company to determine out of pocket expense. Advised may also receive vaccine at local pharmacy or Health Dept. Verbalized acceptance and understanding.  Tdap: Up to date  Flu Vaccine: Up to date  Pneumococcal Vaccine: Up to date   Screening Tests Health Maintenance  Topic Date Due  . COLONOSCOPY  08/09/2023  . TETANUS/TDAP  11/08/2024  . INFLUENZA VACCINE  Completed  . Hepatitis C Screening  Completed  . PNA vac Low Risk Adult  Completed   Cancer Screenings:  Colorectal Screening: Completed 08/08/18. Repeat every 5 years, confirmed recommendations with Dr. Percell [place] office due to external result report not available in care everywhere.   Lung Cancer Screening: (Low Dose CT Chest recommended if Age 16-80 years, 30 pack-year currently smoking OR have quit w/in 15years.) does not qualify.   Additional Screening:  Hepatitis C Screening: does qualify; Completed 02/01/18  Vision Screening: Recommended annual ophthalmology exams for early detection of glaucoma and other disorders of the eye. Is the patient up to date with their annual eye exam?  Yes Who is the provider or what is the name of the office in which the pt attends annual eye exams? [place] Screening: Recommended annual dental exams for proper oral hygiene  Community Resource Referral:  CRR required this visit?  No       Plan:    I have personally reviewed and addressed the Medicare Annual Wellness questionnaire and have noted the following in the patient's chart:  A. Medical and social history B. Use of alcohol, tobacco or illicit drugs  C. Current medications and supplements D. Functional ability and status E.  Nutritional status F.  Physical activity G. Advance directives H. List of other physicians I.  Hospitalizations,  surgeries, and ER visits in previous 12 months J.    [place] such as hearing and vision if needed, cognitive and depression L. Referrals and appointments   In addition, I have reviewed and discussed with patient certain preventive protocols, quality metrics, and best practice recommendations. A written personalized care plan for preventive services as well as general preventive health recommendations were provided to patient.   Signed,  Clemetine Marker, LPN Nurse Health Advisor   Nurse Notes:none

## 2019-02-06 NOTE — Patient Instructions (Signed)
Alan Holt , Thank you for taking time to come for your Medicare Wellness Visit. I appreciate your ongoing commitment to your health goals. Please review the following plan we discussed and let me know if I can assist you in the future.   Screening recommendations/referrals: Colonoscopy: Completed 09/2018 Recommended yearly ophthalmology/optometry visit for glaucoma screening and checkup Recommended yearly dental visit for hygiene and checkup  Vaccinations: Influenza vaccine: done 10/25/18 Pneumococcal vaccine: done 11/09/18 Tdap vaccine: done 11/08/14 Shingles vaccine: Shingrix discussed. Please contact your pharmacy for coverage information.   Advanced directives: Advance directive discussed with you today. I have provided a copy for you to complete at home and have notarized. Once this is complete please bring a copy in to our office so we can scan it into your chart.  Conditions/risks identified: Keep up the great work!  Next appointment: Please follow up in one year for your Medicare Annual Wellness visit.    Preventive Care 69 Years and Older, Male Preventive care refers to lifestyle choices and visits with your health care provider that can promote health and wellness. What does preventive care include?  A yearly physical exam. This is also called an annual well check.  Dental exams once or twice a year.  Routine eye exams. Ask your health care provider how often you should have your eyes checked.  Personal lifestyle choices, including:  Daily care of your teeth and gums.  Regular physical activity.  Eating a healthy diet.  Avoiding tobacco and drug use.  Limiting alcohol use.  Practicing safe sex.  Taking low doses of aspirin every day.  Taking vitamin and mineral supplements as recommended by your health care provider. What happens during an annual well check? The services and screenings done by your health care provider during your annual well check will  depend on your age, overall health, lifestyle risk factors, and family history of disease. Counseling  Your health care provider may ask you questions about your:  Alcohol use.  Tobacco use.  Drug use.  Emotional well-being.  Home and relationship well-being.  Sexual activity.  Eating habits.  History of falls.  Memory and ability to understand (cognition).  Work and work Statistician. Screening  You may have the following tests or measurements:  Height, weight, and BMI.  Blood pressure.  Lipid and cholesterol levels. These may be checked every 5 years, or more frequently if you are over 69 years old.  Skin check.  Lung cancer screening. You may have this screening every year starting at age 69 if you have a 30-pack-year history of smoking and currently smoke or have quit within the past 15 years.  Fecal occult blood test (FOBT) of the stool. You may have this test every year starting at age 69.  Flexible sigmoidoscopy or colonoscopy. You may have a sigmoidoscopy every 5 years or a colonoscopy every 10 years starting at age 69.  Prostate cancer screening. Recommendations will vary depending on your family history and other risks.  Hepatitis C blood test.  Hepatitis B blood test.  Sexually transmitted disease (STD) testing.  Diabetes screening. This is done by checking your blood sugar (glucose) after you have not eaten for a while (fasting). You may have this done every 1-3 years.  Abdominal aortic aneurysm (AAA) screening. You may need this if you are a current or former smoker.  Osteoporosis. You may be screened starting at age 69 if you are at high risk. Talk with your health care provider about your test  results, treatment options, and if necessary, the need for more tests. Vaccines  Your health care provider may recommend certain vaccines, such as:  Influenza vaccine. This is recommended every year.  Tetanus, diphtheria, and acellular pertussis (Tdap,  Td) vaccine. You may need a Td booster every 10 years.  Zoster vaccine. You may need this after age 69.  Pneumococcal 13-valent conjugate (PCV13) vaccine. One dose is recommended after age 69.  Pneumococcal polysaccharide (PPSV23) vaccine. One dose is recommended after age 69. Talk to your health care provider about which screenings and vaccines you need and how often you need them. This information is not intended to replace advice given to you by your health care provider. Make sure you discuss any questions you have with your health care provider. Document Released: 01/09/2016 Document Revised: 09/01/2016 Document Reviewed: 10/14/2015 Elsevier Interactive Patient Education  2017 [employer] Prevention in the Home Falls can cause injuries. They can happen to people of all ages. There are many things you can do to make your home safe and to help prevent falls. What can I do on the outside of my home?  Regularly fix the edges of walkways and driveways and fix any cracks.  Remove anything that might make you trip as you walk through a door, such as a raised step or threshold.  Trim any bushes or trees on the path to your home.  Use bright outdoor lighting.  Clear any walking paths of anything that might make someone trip, such as rocks or tools.  Regularly check to see if handrails are loose or broken. Make sure that both sides of any steps have handrails.  Any raised decks and porches should have guardrails on the edges.  Have any leaves, snow, or ice cleared regularly.  Use sand or salt on walking paths during winter.  Clean up any spills in your garage right away. This includes oil or grease spills. What can I do in the bathroom?  Use night lights.  Install grab bars by the toilet and in the tub and shower. Do not use towel bars as grab bars.  Use non-skid mats or decals in the tub or shower.  If you need to sit down in the shower, use a plastic, non-slip  stool.  Keep the floor dry. Clean up any water that spills on the floor as soon as it happens.  Remove soap buildup in the tub or shower regularly.  Attach bath mats securely with double-sided non-slip rug tape.  Do not have throw rugs and other things on the floor that can make you trip. What can I do in the bedroom?  Use night lights.  Make sure that you have a light by your bed that is easy to reach.  Do not use any sheets or blankets that are too big for your bed. They should not hang down onto the floor.  Have a firm chair that has side arms. You can use this for support while you get dressed.  Do not have throw rugs and other things on the floor that can make you trip. What can I do in the kitchen?  Clean up any spills right away.  Avoid walking on wet floors.  Keep items that you use a lot in easy-to-reach places.  If you need to reach something above you, use a strong step stool that has a grab bar.  Keep electrical cords out of the way.  Do not use floor polish or wax that makes  floors slippery. If you must use wax, use non-skid floor wax.  Do not have throw rugs and other things on the floor that can make you trip. What can I do with my stairs?  Do not leave any items on the stairs.  Make sure that there are handrails on both sides of the stairs and use them. Fix handrails that are broken or loose. Make sure that handrails are as long as the stairways.  Check any carpeting to make sure that it is firmly attached to the stairs. Fix any carpet that is loose or worn.  Avoid having throw rugs at the top or bottom of the stairs. If you do have throw rugs, attach them to the floor with carpet tape.  Make sure that you have a light switch at the top of the stairs and the bottom of the stairs. If you do not have them, ask someone to add them for you. What else can I do to help prevent falls?  Wear shoes that:  Do not have high heels.  Have rubber bottoms.  Are  comfortable and fit you well.  Are closed at the toe. Do not wear sandals.  If you use a stepladder:  Make sure that it is fully opened. Do not climb a closed stepladder.  Make sure that both sides of the stepladder are locked into place.  Ask someone to hold it for you, if possible.  Clearly mark and make sure that you can see:  Any grab bars or handrails.  First and last steps.  Where the edge of each step is.  Use tools that help you move around (mobility aids) if they are needed. These include:  Canes.  Walkers.  Scooters.  Crutches.  Turn on the lights when you go into a dark area. Replace any light bulbs as soon as they burn out.  Set up your furniture so you have a clear path. Avoid moving your furniture around.  If any of your floors are uneven, fix them.  If there are any pets around you, be aware of where they are.  Review your medicines with your doctor. Some medicines can make you feel dizzy. This can increase your chance of falling. Ask your doctor what other things that you can do to help prevent falls. This information is not intended to replace advice given to you by your health care provider. Make sure you discuss any questions you have with your health care provider. Document Released: 10/09/2009 Document Revised: 05/20/2016 Document Reviewed: 01/17/2015 Elsevier Interactive Patient Education  2017 [employer].

## 2019-05-02 ENCOUNTER — Other Ambulatory Visit: Payer: Self-pay | Admitting: Family Medicine

## 2019-05-02 DIAGNOSIS — E781 Pure hyperglyceridemia: Secondary | ICD-10-CM

## 2019-05-10 ENCOUNTER — Ambulatory Visit (INDEPENDENT_AMBULATORY_CARE_PROVIDER_SITE_OTHER): Payer: Medicare Other | Admitting: Nurse Practitioner

## 2019-05-10 ENCOUNTER — Other Ambulatory Visit: Payer: Self-pay

## 2019-05-10 ENCOUNTER — Encounter: Payer: Self-pay | Admitting: Nurse Practitioner

## 2019-05-10 DIAGNOSIS — G4730 Sleep apnea, unspecified: Secondary | ICD-10-CM | POA: Diagnosis not present

## 2019-05-10 DIAGNOSIS — E039 Hypothyroidism, unspecified: Secondary | ICD-10-CM | POA: Diagnosis not present

## 2019-05-10 DIAGNOSIS — R7303 Prediabetes: Secondary | ICD-10-CM

## 2019-05-10 DIAGNOSIS — E786 Lipoprotein deficiency: Secondary | ICD-10-CM

## 2019-05-10 DIAGNOSIS — E781 Pure hyperglyceridemia: Secondary | ICD-10-CM

## 2019-05-10 DIAGNOSIS — E785 Hyperlipidemia, unspecified: Secondary | ICD-10-CM | POA: Diagnosis not present

## 2019-05-10 MED ORDER — NIACIN ER (ANTIHYPERLIPIDEMIC) 500 MG PO TBCR: 1500.0000 mg | EXTENDED_RELEASE_TABLET | Freq: Every day | ORAL | 1 refills | Status: DC

## 2019-05-10 MED ORDER — ROSUVASTATIN CALCIUM 5 MG PO TABS: 5.0000 mg | ORAL_TABLET | Freq: Every day | ORAL | 1 refills | Status: DC

## 2019-05-10 NOTE — Progress Notes (Signed)
Virtual Visit via Telephone Note  I connected with Alan Holt on 05/10/19 at  8:40 AM EDT by a telephone application and verified that I am speaking with the correct person using two identifiers.   Staff discussed the limitations of evaluation and management by telemedicine and the availability of in person appointments. The patient expressed understanding and agreed to proceed.  Patient location: home  My location: Home office Other people present:  none HPI  Hyperlipidemia rx for rosuvastatin 5mg  daily & niacin daily.  Lab Results  Component Value Date   CHOL 104 11/09/2018   HDL 43 11/09/2018   LDLCALC 42 11/09/2018   TRIG 104 11/09/2018   CHOLHDL 2.4 11/09/2018   Hypothyroidism rx 150 mcg of synthroid on even days and 167mcg of synthroid on odd days. This is managed by the [facility] MD. 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   Sleep apnea CPAP self reported compliance 100%, helps him feel well rested  Prediabetes Cut down on eating red meats to maybe twice a week Eats 3 servings of vegetables at least a day.   Drinks sugar free drinks typically and at least 20 ounces of water.  Lab Results  Component Value Date   HGBA1C 5.6 11/09/2018    PHQ2/9: Depression screen [facility] 2/9 05/10/2019 02/06/2019 11/09/2018 05/08/2018 02/01/2018  Decreased Interest 0 0 0 0 0  Down, Depressed, Hopeless 0 0 0 0 0  PHQ - 2 Score 0 0 0 0 0  Altered sleeping 0 - 0 - -  Tired, decreased energy 0 - 0 - -  Change in appetite 0 - 0 - -  Feeling bad or failure about yourself  0 - 0 - -  Trouble concentrating 0 - 0 - -  Moving slowly or fidgety/restless 0 - 0 - -  Suicidal thoughts 0 - 0 - -  PHQ-9 Score 0 - 0 - -  Difficult doing work/chores Not difficult at all - Not difficult at all - -    PHQ reviewed. Negative  Patient Active Problem List   Diagnosis Date Noted  . Sleep apnea 06/01/2018  . Prediabetes 05/10/2018  . Increased prostate specific antigen (PSA)  velocity 05/08/2018  . Obesity (BMI 30.0-34.9) 05/08/2018  . Elevated BP without diagnosis of hypertension 11/10/2016  . Hyperlipidemia with low HDL 08/10/2016  . Hyperglycemia 08/10/2016  . Low testosterone 07/20/2016  . Hypertriglyceridemia 01/12/2016  . Low serum HDL 07/09/2015  . Adult hypothyroidism 07/09/2015    Past Medical History:  Diagnosis Date  . Allergy   . Hypertension   . Prediabetes 05/10/2018  . Sleep apnea   . Thyroid disease     Past Surgical History:  Procedure Laterality Date  . HERNIA REPAIR     childhood  . KNEE SURGERY Left 1993  . MOUTH SURGERY  04/05/2018   bone graft, 3 teeth pulled  . THYROID SURGERY  2015  . TOTAL THYROIDECTOMY Bilateral 07/15/2014   Dr. Kathyrn Sheriff.     Social History   Tobacco Use  . Smoking status: Never Smoker  . Smokeless tobacco: Never Used  Substance Use Topics  . Alcohol use: No    Alcohol/week: 0.0 standard drinks     Current Outpatient Medications:  .  aspirin 81 MG tablet, Take 81 mg by mouth daily., Disp: , Rfl:  .  calcium citrate (CALCITRATE - DOSED IN MG ELEMENTAL CALCIUM) 950 MG tablet, Take 1,200 mg of elemental calcium by mouth daily., Disp: , Rfl:  .  cholecalciferol (VITAMIN D) 1000 units tablet, Take 1,000 Units by mouth daily., Disp: , Rfl:  .  ibuprofen (ADVIL,MOTRIN) 600 MG tablet, Take 600 mg by mouth every 8 (eight) hours as needed. Pt takes rarely, 1-2 times per week, Disp: , Rfl:  .  levothyroxine (SYNTHROID, LEVOTHROID) 137 MCG tablet, Take 1 tablet every other day by mouth. Patient takes this medication on odd days , Disp: , Rfl:  .  levothyroxine (SYNTHROID, LEVOTHROID) 150 MCG tablet, Take 1 tablet every other day by mouth. Patient takes this medication on even days, Disp: , Rfl:  .  niacin (NIASPAN) 500 MG CR tablet, Take 3 tablets (1,500 mg total) by mouth at bedtime., Disp: 270 tablet, Rfl: 1 .  rosuvastatin (CRESTOR) 5 MG tablet, TAKE 1 TABLET BY MOUTH AT  BEDTIME, Disp: 90 tablet, Rfl:  0  Allergies  Allergen Reactions  . Erythromycin Rash    ROS   No other specific complaints in a complete review of systems (except as listed in HPI above).  Objective  There were no vitals filed for this visit.  There is no height or weight on file to calculate BMI.  Nursing Note and Vital Signs reviewed.  Physical Exam  Alert and oriented   Assessment & Plan 1. Hyperlipidemia with low HDL - rosuvastatin (CRESTOR) 5 MG tablet; Take 1 tablet (5 mg total) by mouth at bedtime.  Dispense: 90 tablet; Refill: 1 - niacin (NIASPAN) 500 MG CR tablet; Take 3 tablets (1,500 mg total) by mouth at bedtime.  Dispense: 270 tablet; Refill: 1  2. Prediabetes Discussed diet, routinely check labs at follow-up  3. Adult hypothyroidism Managed by ENT  4. Sleep apnea, unspecified type Stable, continue CPAP   5. Hypertriglyceridemia - rosuvastatin (CRESTOR) 5 MG tablet; Take 1 tablet (5 mg total) by mouth at bedtime.  Dispense: 90 tablet; Refill: 1 - niacin (NIASPAN) 500 MG CR tablet; Take 3 tablets (1,500 mg total) by mouth at bedtime.  Dispense: 270 tablet; Refill: 1    Follow Up Instructions:    I discussed the assessment and treatment plan with the patient. The patient was provided an opportunity to ask questions and all were answered. The patient agreed with the plan and demonstrated an understanding of the instructions.   The patient was advised to call back or seek an in-person evaluation if the symptoms worsen or if the condition fails to improve as anticipated.  I provided 12 minutes of non-face-to-face time during this encounter.   Fredderick Severance, NP

## 2019-10-29 ENCOUNTER — Other Ambulatory Visit: Payer: Self-pay

## 2019-10-29 DIAGNOSIS — E781 Pure hyperglyceridemia: Secondary | ICD-10-CM

## 2019-10-30 MED ORDER — ROSUVASTATIN CALCIUM 5 MG PO TABS: 5.0000 mg | ORAL_TABLET | Freq: Every day | ORAL | 1 refills | Status: DC

## 2019-11-05 ENCOUNTER — Other Ambulatory Visit: Payer: Self-pay

## 2019-11-05 DIAGNOSIS — E781 Pure hyperglyceridemia: Secondary | ICD-10-CM

## 2019-11-05 MED ORDER — NIACIN ER (ANTIHYPERLIPIDEMIC) 500 MG PO TBCR: 1500.0000 mg | EXTENDED_RELEASE_TABLET | Freq: Every day | ORAL | 1 refills | Status: DC

## 2019-11-13 ENCOUNTER — Encounter: Payer: Self-pay | Admitting: Family Medicine

## 2019-11-13 ENCOUNTER — Other Ambulatory Visit: Payer: Self-pay

## 2019-11-13 ENCOUNTER — Ambulatory Visit (INDEPENDENT_AMBULATORY_CARE_PROVIDER_SITE_OTHER): Payer: Medicare Other | Admitting: Family Medicine

## 2019-11-13 VITALS — Ht 73.5 in | Wt 230.0 lb

## 2019-11-13 DIAGNOSIS — R7303 Prediabetes: Secondary | ICD-10-CM | POA: Diagnosis not present

## 2019-11-13 DIAGNOSIS — E039 Hypothyroidism, unspecified: Secondary | ICD-10-CM

## 2019-11-13 DIAGNOSIS — Z5181 Encounter for therapeutic drug level monitoring: Secondary | ICD-10-CM

## 2019-11-13 DIAGNOSIS — E785 Hyperlipidemia, unspecified: Secondary | ICD-10-CM | POA: Diagnosis not present

## 2019-11-13 DIAGNOSIS — E786 Lipoprotein deficiency: Secondary | ICD-10-CM

## 2019-11-13 NOTE — Progress Notes (Deleted)
**Note Alan-Identified via Obfuscation** Name: Alan Holt   MRN: [id number]    DOB: June 04, 1951   Date:11/13/2019       Progress Note  Subjective:    Chief Complaint  Chief Complaint  Patient presents with  . Follow-up    I connected with  Alan Holt  on 11/13/19 at  8:40 AM EST by a video enabled telemedicine application and verified that I am speaking with the correct person using two identifiers.  I discussed the limitations of evaluation and management by telemedicine and the availability of in person appointments. The patient expressed understanding and agreed to proceed. Staff also discussed with the patient that there may be a patient responsible charge related to this service. Patient Location: *** Provider Location: *** Additional Individuals present: ***  HPI  Patient Active Problem List   Diagnosis Date Noted  . Sleep apnea 06/01/2018  . Prediabetes 05/10/2018  . Increased prostate specific antigen (PSA) velocity 05/08/2018  . Obesity (BMI 30.0-34.9) 05/08/2018  . Elevated BP without diagnosis of hypertension 11/10/2016  . Hyperlipidemia with low HDL 08/10/2016  . Hyperglycemia 08/10/2016  . Low testosterone 07/20/2016  . Hypertriglyceridemia 01/12/2016  . Low serum HDL 07/09/2015  . Adult hypothyroidism 07/09/2015    Past Surgical History:  Procedure Laterality Date  . HERNIA REPAIR     childhood  . KNEE SURGERY Left 1993  . MOUTH SURGERY  04/05/2018   bone graft, 3 teeth pulled  . THYROID SURGERY  2015  . TOTAL THYROIDECTOMY Bilateral 07/15/2014   Dr. Kathyrn Sheriff.     Family History  Problem Relation Age of Onset  . Diabetes Mother   . Alcohol abuse Father   . Stroke Father   . Diabetes Brother   . Heart attack Brother     Social History   Socioeconomic History  . Marital status: Married    Spouse name: Not on file  . Number of children: Not on file  . Years of education: Not on file  . Highest education level: Master's degree (e.g., MA, MS, MEng, MEd, MSW, MBA)  Occupational  History    Comment: retired PE teacher  Social Needs  . Financial resource strain: Not hard at all  . Food insecurity    Worry: Never true    Inability: Never true  . Transportation needs    Medical: No    Non-medical: No  Tobacco Use  . Smoking status: Never Smoker  . Smokeless tobacco: Never Used  Substance and Sexual Activity  . Alcohol use: No    Alcohol/week: 0.0 standard drinks  . Drug use: No  . Sexual activity: Not Currently  Lifestyle  . Physical activity    Days per week: 5 days    Minutes per session: 30 min  . Stress: Not at all  Relationships  . Social connections    Talks on phone: More than three times a week    Gets together: Three times a week    Attends religious service: More than 4 times per year    Active member of club or organization: No    Attends meetings of clubs or organizations: Never    Relationship status: Married  . Intimate partner violence    Fear of current or ex partner: No    Emotionally abused: No    Physically abused: No    Forced sexual activity: No  Other Topics Concern  . Not on file  Social History Narrative  . Not on file     Current  Outpatient Medications:  .  aspirin 81 MG tablet, Take 81 mg by mouth daily., Disp: , Rfl:  .  calcium citrate (CALCITRATE - DOSED IN MG ELEMENTAL CALCIUM) 950 MG tablet, Take 1,200 mg of elemental calcium by mouth daily., Disp: , Rfl:  .  cholecalciferol (VITAMIN D) 1000 units tablet, Take 1,000 Units by mouth daily., Disp: , Rfl:  .  ibuprofen (ADVIL,MOTRIN) 600 MG tablet, Take 600 mg by mouth every 8 (eight) hours as needed. Pt takes rarely, 1-2 times per week, Disp: , Rfl:  .  levothyroxine (SYNTHROID, LEVOTHROID) 137 MCG tablet, Take 1 tablet every other day by mouth. Patient takes this medication on odd days , Disp: , Rfl:  .  levothyroxine (SYNTHROID, LEVOTHROID) 150 MCG tablet, Take 1 tablet every other day by mouth. Patient takes this medication on even days, Disp: , Rfl:  .  niacin  (NIASPAN) 500 MG CR tablet, Take 3 tablets (1,500 mg total) by mouth at bedtime., Disp: 270 tablet, Rfl: 1 .  rosuvastatin (CRESTOR) 5 MG tablet, Take 1 tablet (5 mg total) by mouth at bedtime., Disp: 90 tablet, Rfl: 1  Allergies  Allergen Reactions  . Erythromycin Rash    I personally reviewed {Reviewed:14835} with the patient/caregiver today.  Review of Systems    Objective:    Virtual encounter, vitals limited, only able to obtain the following Today's Vitals   11/13/19 0819  Weight: 230 lb (104.3 kg)  Height: 6' 1.5" (1.867 m)   Body mass index is 29.93 kg/m. Nursing Note and Vital Signs reviewed.  Physical Exam  PE limited by telephone encounter  No results found for this or any previous visit (from the past 72 hour(s)).  PHQ2/9: Depression screen [facility] 2/9 11/13/2019 05/10/2019 02/06/2019 11/09/2018 05/08/2018  Decreased Interest 0 0 0 0 0  Down, Depressed, Hopeless 0 0 0 0 0  PHQ - 2 Score 0 0 0 0 0  Altered sleeping 0 0 - 0 -  Tired, decreased energy 0 0 - 0 -  Change in appetite 0 0 - 0 -  Feeling bad or failure about yourself  0 0 - 0 -  Trouble concentrating 0 0 - 0 -  Moving slowly or fidgety/restless 0 0 - 0 -  Suicidal thoughts 0 0 - 0 -  PHQ-9 Score 0 0 - 0 -  Difficult doing work/chores Not difficult at all Not difficult at all - Not difficult at all -   PHQ-2/9 Result is {gen negative/positive:315881}.    Fall Risk: Fall Risk  11/13/2019 05/10/2019 02/06/2019 11/09/2018 05/08/2018  Falls in the past year? 0 0 0 0 No  Number falls in past yr: 0 - 0 - -  Injury with Fall? 0 - 0 - -  Follow up - - Falls prevention discussed - -     Assessment and Plan:   Problem List Items Addressed This Visit    None       I discussed the assessment and treatment plan with the patient. The patient was provided an opportunity to ask questions and all were answered. The patient agreed with the plan and demonstrated an understanding of the instructions.  The  patient was advised to call back or seek an in-person evaluation if the symptoms worsen or if the condition fails to improve as anticipated.  I provided *** minutes of non-face-to-face time during this encounter.  Delsa Grana, PA-C 11/17/209:17 AM

## 2019-11-13 NOTE — Progress Notes (Signed)
Name: Alan Holt   MRN: [id number]    DOB: [birth year]   Date:11/13/2019       Progress Note  Subjective:    Chief Complaint  Chief Complaint  Patient presents with  . Follow-up    I connected with  [name] on 11/13/19 at  8:40 AM EST by telephone and verified that I am speaking with the correct person using two identifiers.  I discussed the limitations, risks, security and privacy concerns of performing an evaluation and management service by telephone and the availability of in person appointments. Staff also discussed with the patient that there may be a patient responsible charge related to this service. Patient Location: home Provider Location: [facility] Additional Individuals present: none  HPI  HLD: Crestor 5 mg taking every day in the evening, doesn't miss doses. He's also taking Niacin daily - says that many years ago it was put on by a PCP for helping his HDL to improve No myalgias, denies SE. Diet efforts - avoid fried foods, "being careful" lean meats. Family hx of heart attacks in two brothers, one was 76 (deceased), one had heart attack 90 - was a heavy.   Hypothyroid:   Levothyroxine 137 on odd days and 150 mcg on even days - Dr. [facility] - in [place], last TSH was June and it was good.     Dr. Ernie Hew did EKG and stress test in the past - pt does not want to increase any meds for CV protection, he has been checked by cardiology, no concerning CAD/ACS, says he is very healthy and working on diet and exercise.   Patient Active Problem List   Diagnosis Date Noted  . Sleep apnea 06/01/2018  . Prediabetes 05/10/2018  . Increased prostate specific antigen (PSA) velocity 05/08/2018  . Obesity (BMI 30.0-34.9) 05/08/2018  . Elevated BP without diagnosis of hypertension 11/10/2016  . Hyperlipidemia with low HDL 08/10/2016  . Hyperglycemia 08/10/2016  . Low testosterone 07/20/2016  . Hypertriglyceridemia 01/12/2016  . Low serum HDL  07/09/2015  . Adult hypothyroidism 07/09/2015    Past Surgical History:  Procedure Laterality Date  . HERNIA REPAIR     childhood  . KNEE SURGERY Left 1993  . MOUTH SURGERY  04/05/2018   bone graft, 3 teeth pulled  . THYROID SURGERY  2015  . TOTAL THYROIDECTOMY Bilateral 07/15/2014   Dr. Kathyrn Sheriff.     Family History  Problem Relation Age of Onset  . Diabetes Mother   . Alcohol abuse Father   . Stroke Father   . Diabetes Brother   . Heart attack Brother     Social History   Socioeconomic History  . Marital status: Married    Spouse name: Not on file  . Number of children: Not on file  . Years of education: Not on file  . Highest education level: Master's degree (e.g., MA, MS, MEng, MEd, MSW, MBA)  Occupational History    Comment: retired PE teacher  Social Needs  . Financial resource strain: Not hard at all  . Food insecurity    Worry: Never true    Inability: Never true  . Transportation needs    Medical: No    Non-medical: No  Tobacco Use  . Smoking status: Never Smoker  . Smokeless tobacco: Never Used  Substance and Sexual Activity  . Alcohol use: No    Alcohol/week: 0.0 standard drinks  . Drug use: No  . Sexual activity: Not Currently  Lifestyle  . Physical activity    Days per week: 5 days    Minutes per session: 30 min  . Stress: Not at all  Relationships  . Social connections    Talks on phone: More than three times a week    Gets together: Three times a week    Attends religious service: More than 4 times per year    Active member of club or organization: No    Attends meetings of clubs or organizations: Never    Relationship status: Married  . Intimate partner violence    Fear of current or ex partner: No    Emotionally abused: No    Physically abused: No    Forced sexual activity: No  Other Topics Concern  . Not on file  Social History Narrative  . Not on file     Current Outpatient Medications:  .  aspirin 81 MG tablet, Take 81 mg  by mouth daily., Disp: , Rfl:  .  calcium citrate (CALCITRATE - DOSED IN MG ELEMENTAL CALCIUM) 950 MG tablet, Take 1,200 mg of elemental calcium by mouth daily., Disp: , Rfl:  .  cholecalciferol (VITAMIN D) 1000 units tablet, Take 1,000 Units by mouth daily., Disp: , Rfl:  .  ibuprofen (ADVIL,MOTRIN) 600 MG tablet, Take 600 mg by mouth every 8 (eight) hours as needed. Pt takes rarely, 1-2 times per week, Disp: , Rfl:  .  levothyroxine (SYNTHROID, LEVOTHROID) 137 MCG tablet, Take 1 tablet every other day by mouth. Patient takes this medication on odd days , Disp: , Rfl:  .  levothyroxine (SYNTHROID, LEVOTHROID) 150 MCG tablet, Take 1 tablet every other day by mouth. Patient takes this medication on even days, Disp: , Rfl:  .  niacin (NIASPAN) 500 MG CR tablet, Take 3 tablets (1,500 mg total) by mouth at bedtime., Disp: 270 tablet, Rfl: 1 .  rosuvastatin (CRESTOR) 5 MG tablet, Take 1 tablet (5 mg total) by mouth at bedtime., Disp: 90 tablet, Rfl: 1  Allergies  Allergen Reactions  . Erythromycin Rash    I personally reviewed active problem list, medication list, allergies, notes from last encounter, lab results with the patient/caregiver today.  Review of Systems  Constitutional: Negative.   HENT: Negative.   Eyes: Negative.   Respiratory: Negative.   Cardiovascular: Negative.   Gastrointestinal: Negative.   Endocrine: Negative.   Genitourinary: Negative.   Musculoskeletal: Negative.   Skin: Negative.   Allergic/Immunologic: Negative.   Neurological: Negative.   Hematological: Negative.   Psychiatric/Behavioral: Negative.   All other systems reviewed and are negative.     Objective:    Virtual encounter, vitals limited, only able to obtain the following: Today's Vitals   11/13/19 0819  Weight: 230 lb (104.3 kg)  Height: 6' 1.5" (1.867 m)   Body mass index is 29.93 kg/m. Nursing Note and Vital Signs reviewed.  Physical Exam Vitals signs and nursing note reviewed.  Neck:      Comments: Normal phonation Pulmonary:     Comments: No audible wheeze stridor or respiratory distress Neurological:     Mental Status: He is alert.     PE limited by telephone encounter  No results found for this or any previous visit (from the past 72 hour(s)).  PHQ2/9: Depression screen [facility] 2/9 11/13/2019 05/10/2019 02/06/2019 11/09/2018 05/08/2018  Decreased Interest 0 0 0 0 0  Down, Depressed, Hopeless 0 0 0 0 0  PHQ - 2 Score 0 0 0 0 0  Altered sleeping 0  0 - 0 -  Tired, decreased energy 0 0 - 0 -  Change in appetite 0 0 - 0 -  Feeling bad or failure about yourself  0 0 - 0 -  Trouble concentrating 0 0 - 0 -  Moving slowly or fidgety/restless 0 0 - 0 -  Suicidal thoughts 0 0 - 0 -  PHQ-9 Score 0 0 - 0 -  Difficult doing work/chores Not difficult at all Not difficult at all - Not difficult at all -   PHQ-2/9 Result is negative.    Fall Risk: Fall Risk  11/13/2019 05/10/2019 02/06/2019 11/09/2018 05/08/2018  Falls in the past year? 0 0 0 0 No  Number falls in past yr: 0 - 0 - -  Injury with Fall? 0 - 0 - -  Follow up - - Falls prevention discussed - -     Assessment and Plan:       ICD-10-CM   1. Hyperlipidemia with low HDL  E78.5 CMP w GFR   E78.6 Lipid Panel   due for labs, healthy lifestyle, compliant with meds  2. Prediabetes  R73.03 A1C   recheck A1C with hx of prediabetes  3. Adult hypothyroidism  E03.9    per specialist  4. Encounter for medication monitoring  Z51.81 CMP w GFR    Lipid Panel    A1C    CBC w/ Diff   due for labs     I discussed the assessment and treatment plan with the patient. The patient was provided an opportunity to ask questions and all were answered. The patient agreed with the plan and demonstrated an understanding of the instructions.   The patient was advised to call back or seek an in-person evaluation if the symptoms worsen or if the condition fails to improve as anticipated.  I provided 12 minutes of non-face-to-face  time during this encounter.  Delsa Grana, PA-C 11/13/19 9:19 AM

## 2019-11-16 LAB — COMPLETE METABOLIC PANEL WITH GFR
AG Ratio: 2.1 (calc) (ref 1.0–2.5)
ALT: 40 U/L (ref 9–46)
AST: 27 U/L (ref 10–35)
Albumin: 4.7 g/dL (ref 3.6–5.1)
Alkaline phosphatase (APISO): 73 U/L (ref 35–144)
BUN: 22 mg/dL (ref 7–25)
CO2: 26 mmol/L (ref 20–32)
Calcium: 9.8 mg/dL (ref 8.6–10.3)
Chloride: 104 mmol/L (ref 98–110)
Creat: 1 mg/dL (ref 0.70–1.25)
GFR, Est African American: 89 mL/min/{1.73_m2} (ref >=60)
GFR, Est Non African American: 76 mL/min/{1.73_m2} (ref >=60)
Globulin: 2.2 g/dL (calc) (ref 1.9–3.7)
Glucose, Bld: 127 mg/dL — ABNORMAL HIGH (ref 65–99)
Potassium: 4.3 mmol/L (ref 3.5–5.3)
Sodium: 140 mmol/L (ref 135–146)
Total Bilirubin: 0.9 mg/dL (ref 0.2–1.2)
Total Protein: 6.9 g/dL (ref 6.1–8.1)

## 2019-11-16 LAB — LIPID PANEL
Cholesterol: 121 mg/dL (ref <200)
HDL: 35 mg/dL — ABNORMAL LOW (ref >=40)
LDL Cholesterol (Calc): 58 mg/dL (calc)
Non-HDL Cholesterol (Calc): 86 mg/dL (calc) (ref <130)
Total CHOL/HDL Ratio: 3.5 (calc) (ref <5.0)
Triglycerides: 228 mg/dL — ABNORMAL HIGH (ref <150)

## 2019-11-16 LAB — CBC WITH DIFFERENTIAL/PLATELET
Absolute Monocytes: 437 cells/uL (ref 200–950)
Basophils Absolute: 32 cells/uL (ref 0–200)
Basophils Relative: 0.6 %
Eosinophils Absolute: 70 cells/uL (ref 15–500)
Eosinophils Relative: 1.3 %
HCT: 48.4 % (ref 38.5–50.0)
Hemoglobin: 16.5 g/dL (ref 13.2–17.1)
Lymphs Abs: 1912 cells/uL (ref 850–3900)
MCH: 31.6 pg (ref 27.0–33.0)
MCHC: 34.1 g/dL (ref 32.0–36.0)
MCV: 92.7 fL (ref 80.0–100.0)
MPV: 9.3 fL (ref 7.5–12.5)
Monocytes Relative: 8.1 %
Neutro Abs: 2948 cells/uL (ref 1500–7800)
Neutrophils Relative %: 54.6 %
Platelets: 223 10*3/uL (ref 140–400)
RBC: 5.22 10*6/uL (ref 4.20–5.80)
RDW: 13 % (ref 11.0–15.0)
Total Lymphocyte: 35.4 %
WBC: 5.4 10*3/uL (ref 3.8–10.8)

## 2019-11-16 LAB — HEMOGLOBIN A1C
Hgb A1c MFr Bld: 5.9 % of total Hgb — ABNORMAL HIGH (ref <5.7)
Mean Plasma Glucose: 123 (calc)
eAG (mmol/L): 6.8 (calc)

## 2020-02-12 ENCOUNTER — Ambulatory Visit (INDEPENDENT_AMBULATORY_CARE_PROVIDER_SITE_OTHER): Payer: Medicare PPO

## 2020-02-12 VITALS — Ht 74.0 in | Wt 231.0 lb

## 2020-02-12 DIAGNOSIS — Z0000 Encounter for general adult medical examination without abnormal findings: Secondary | ICD-10-CM

## 2020-02-12 NOTE — Progress Notes (Signed)
Subjective:   Alan Holt is a 69 y.o. male who presents for Medicare Annual/Subsequent preventive examination.  Virtual Visit via Telephone Note  I connected with LAKOTA ZELAZNY on 02/12/20 at 10:00 AM EST by telephone and verified that I am speaking with the correct person using two identifiers.  Medicare Annual Wellness visit completed telephonically due to Covid-19 pandemic.   Location: Patient: home Provider: office   I discussed the limitations, risks, security and privacy concerns of performing an evaluation and management service by telephone and the availability of in person appointments. The patient expressed understanding and agreed to proceed.  Some vital signs may be absent or patient reported.   Clemetine Marker, LPN    Review of Systems:   Cardiac Risk Factors include: advanced age (>81men, >51 women);dyslipidemia;male gender     Objective:    Vitals: Ht 6\' 2"  (1.88 m)   Wt 231 lb (104.8 kg)   BMI 29.66 kg/m   Body mass index is 29.66 kg/m.  Advanced Directives 02/12/2020 02/06/2019 10/26/2017 05/03/2017 02/01/2017 11/10/2016 08/10/2016  Does Patient Have a Medical Advance Directive? Yes No Yes No No No No  Type of Paramedic of [place];Living will - [place];Living will - - - -  Does patient want to make changes to medical advance directive? - - - - - - -  Copy of [place] in Chart? No - copy requested - No - copy requested - - - -  Would patient like information on creating a medical advance directive? - Yes (MAU/Ambulatory/Procedural Areas - Information given) - - - No - patient declined information No - patient declined information    Tobacco Social History   Tobacco Use  Smoking Status Never Smoker  Smokeless Tobacco Never Used     Counseling given: Not Answered   Clinical Intake:  Pre-visit preparation completed: Yes  Pain : No/denies pain     BMI - recorded: 29.66 Nutritional  Status: BMI 25 -29 Overweight Nutritional Risks: None Diabetes: No  How often do you need to have someone help you when you read instructions, pamphlets, or other written materials from your doctor or pharmacy?: 1 - Never  Interpreter Needed?: No  Information entered by :: Clemetine Marker LPN  Past Medical History:  Diagnosis Date  . Allergy   . Hypertension   . Prediabetes 05/10/2018  . Sleep apnea   . Thyroid disease    Past Surgical History:  Procedure Laterality Date  . HERNIA REPAIR     childhood  . KNEE SURGERY Left 1993  . MOUTH SURGERY  04/05/2018   bone graft, 3 teeth pulled  . THYROID SURGERY  2015  . TOTAL THYROIDECTOMY Bilateral 07/15/2014   Dr. Kathyrn Sheriff.    Family History  Problem Relation Age of Onset  . Diabetes Mother   . Alcohol abuse Father   . Stroke Father   . Diabetes Brother   . Heart attack Brother    Social History   Socioeconomic History  . Marital status: Married    Spouse name: Not on file  . Number of children: Not on file  . Years of education: Not on file  . Highest education level: Master's degree (e.g., MA, MS, MEng, MEd, MSW, MBA)  Occupational History    Comment: retired PE teacher  Tobacco Use  . Smoking status: Never Smoker  . Smokeless tobacco: Never Used  Substance and Sexual Activity  . Alcohol use: No  Alcohol/week: 0.0 standard drinks  . Drug use: No  . Sexual activity: Not Currently  Other Topics Concern  . Not on file  Social History Narrative  . Not on file   Social Determinants of Health   Financial Resource Strain:   . Difficulty of Paying Living Expenses: Not on file  Food Insecurity: No Food Insecurity  . Worried About Charity fundraiser in the Last Year: Never true  . Ran Out of Food in the Last Year: Never true  Transportation Needs: No Transportation Needs  . Lack of Transportation (Medical): No  . Lack of Transportation (Non-Medical): No  Physical Activity: Sufficiently Active  . Days of Exercise  per Week: 5 days  . Minutes of Exercise per Session: 60 min  Stress: No Stress Concern Present  . Feeling of Stress : Only a little  Social Connections: Slightly Isolated  . Frequency of Communication with Friends and Family: More than three times a week  . Frequency of Social Gatherings with Friends and Family: Three times a week  . Attends Religious Services: More than 4 times per year  . Active Member of Clubs or Organizations: No  . Attends Archivist Meetings: Never  . Marital Status: Married    Outpatient Encounter Medications as of 02/12/2020  Medication Sig  . aspirin 81 MG tablet Take 81 mg by mouth daily.  . calcium citrate (CALCITRATE - DOSED IN MG ELEMENTAL CALCIUM) 950 MG tablet Take 1,200 mg of elemental calcium by mouth daily.  . cholecalciferol (VITAMIN D) 1000 units tablet Take 1,000 Units by mouth daily.  Marland Kitchen ibuprofen (ADVIL,MOTRIN) 600 MG tablet Take 600 mg by mouth every 8 (eight) hours as needed. Pt takes rarely, 1-2 times per week  . levothyroxine (SYNTHROID, LEVOTHROID) 137 MCG tablet Take 1 tablet every other day by mouth. Patient takes this medication on odd days   . levothyroxine (SYNTHROID, LEVOTHROID) 150 MCG tablet Take 1 tablet every other day by mouth. Patient takes this medication on even days  . niacin (NIASPAN) 500 MG CR tablet Take 3 tablets (1,500 mg total) by mouth at bedtime.  . rosuvastatin (CRESTOR) 5 MG tablet Take 1 tablet (5 mg total) by mouth at bedtime.   No facility-administered encounter medications on file as of 02/12/2020.    Activities of Daily Living In your present state of health, do you have any difficulty performing the following activities: 02/12/2020 11/13/2019  Hearing? N N  Comment declines hearing aids -  Vision? N N  Difficulty concentrating or making decisions? N N  Walking or climbing stairs? N N  Dressing or bathing? N N  Doing errands, shopping? N N  Preparing Food and eating ? N -  Using the Toilet? N -  In  the past six months, have you accidently leaked urine? N -  Do you have problems with loss of bowel control? N -  Managing your Medications? N -  Managing your Finances? N -  Housekeeping or managing your Housekeeping? N -  Some recent data might be hidden    Patient Care Team: Delsa Grana, PA-C as PCP - General (Family Medicine) Margaretha Sheffield, MD as Consulting Physician (Otolaryngology)   Assessment:   This is a routine wellness examination for [place].  Exercise Activities and Dietary recommendations Current Exercise Habits: Home exercise routine, Type of exercise: walking;calisthenics, Time (Minutes): 60, Frequency (Times/Week): 5, Weekly Exercise (Minutes/Week): 300, Intensity: Moderate, Exercise limited by: None identified  Goals    . Exercise 150  minutes per week (moderate activity)     Continue to remain active by jogging, or doing other strengthening exercises for a minimum of 150 minutes per week.       Fall Risk Fall Risk  02/12/2020 11/13/2019 05/10/2019 02/06/2019 11/09/2018  Falls in the past year? 0 0 0 0 0  Number falls in past yr: 0 0 - 0 -  Injury with Fall? 0 0 - 0 -  Risk for fall due to : No Fall Risks - - - -  Follow up Falls prevention discussed - - Falls prevention discussed -   FALL RISK PREVENTION PERTAINING TO THE HOME:  Any stairs in or around the home? Yes  If so, do they handrails? Yes   Home free of loose throw rugs in walkways, pet beds, electrical cords, etc? Yes  Adequate lighting in your home to reduce risk of falls? Yes   ASSISTIVE DEVICES UTILIZED TO PREVENT FALLS:  Life alert? No  Use of a cane, walker or w/c? No  Grab bars in the bathroom? No  Shower chair or bench in shower? No  Elevated toilet seat or a handicapped toilet? No   DME ORDERS:  DME order needed?  No   TIMED UP AND GO:  Was the test performed? No . telephonic visit.  Education: Fall risk prevention has been discussed.  Intervention(s) required? No    Depression  Screen PHQ 2/9 Scores 02/12/2020 11/13/2019 05/10/2019 02/06/2019  PHQ - 2 Score 0 0 0 0  PHQ- 9 Score - 0 0 -    Cognitive Function 6CIT deferred for 2021 AWV; pt has no memory issues     6CIT Screen 02/06/2019  What Year? 0 points  What month? 0 points  What time? 0 points  Count back from 20 0 points  Months in reverse 0 points  Repeat phrase 0 points  Total Score 0    Immunization History  Administered Date(s) Administered  . Fluad Quad(high Dose 65+) 10/12/2019  . Influenza, High Dose Seasonal PF 10/23/2018  . Influenza-Unspecified 09/27/2015, 10/05/2016  . PFIZER SARS-COV-2 Vaccination 01/21/2020, 02/12/2020  . Pneumococcal Conjugate-13 10/26/2017  . Pneumococcal Polysaccharide-23 11/09/2018  . Tdap 11/08/2014    Qualifies for Shingles Vaccine? Yes  . Due for Shingrix. Education has been provided regarding the importance of this vaccine. Pt has been advised to call insurance company to determine out of pocket expense. Advised may also receive vaccine at local pharmacy or Health Dept. Verbalized acceptance and understanding.  Tdap: Up to date  Flu Vaccine: Up to date  Pneumococcal Vaccine: Up to date   Screening Tests Health Maintenance  Topic Date Due  . COLONOSCOPY  08/09/2023  . TETANUS/TDAP  11/08/2024  . INFLUENZA VACCINE  Completed  . Hepatitis C Screening  Completed  . PNA vac Low Risk Adult  Completed   Cancer Screenings:  Colorectal Screening: Completed 08/08/18. Repeat every 5 years  Lung Cancer Screening: (Low Dose CT Chest recommended if Age 25-80 years, 30 pack-year currently smoking OR have quit w/in 15years.) does not qualify.   Additional Screening:  Hepatitis C Screening: does qualify; Completed 02/01/18  Vision Screening: Recommended annual ophthalmology exams for early detection of glaucoma and other disorders of the eye. Is the patient up to date with their annual eye exam?  Yes  Who is the provider or what is the name of the office in  which the pt attends annual eye exams? [facility]   Dental Screening: Recommended annual dental exams  for proper oral hygiene  Community Resource Referral:  CRR required this visit?  No       Plan:    I have personally reviewed and addressed the Medicare Annual Wellness questionnaire and have noted the following in the patient's chart:  A. Medical and social history B. Use of alcohol, tobacco or illicit drugs  C. Current medications and supplements D. Functional ability and status E.  Nutritional status F.  Physical activity G. Advance directives H. List of other physicians I.  Hospitalizations, surgeries, and ER visits in previous 12 months J.  [place] such as hearing and vision if needed, cognitive and depression L. Referrals and appointments   In addition, I have reviewed and discussed with patient certain preventive protocols, quality metrics, and best practice recommendations. A written personalized care plan for preventive services as well as general preventive health recommendations were provided to patient.   SignedClemetine Marker, LPN Nurse Health Advisor   Nurse Notes: Pt states he has used a bipap machine since 2011 and had to get a new machine in January 2021 that seems to put out more force and the noise is much louder and keeping his wife awake at times. He is working with Dr. Maisie Fus office to make sure he has to appropriate device.

## 2020-02-12 NOTE — Patient Instructions (Signed)
Alan Holt , Thank you for taking time to come for your Medicare Wellness Visit. I appreciate your ongoing commitment to your health goals. Please review the following plan we discussed and let me know if I can assist you in the future.   Screening recommendations/referrals: Colonoscopy: done 08/08/18. Repeat in 2024. Recommended yearly ophthalmology/optometry visit for glaucoma screening and checkup Recommended yearly dental visit for hygiene and checkup  Vaccinations: Influenza vaccine: done 10/12/19 Pneumococcal vaccine: done 11/09/18 Tdap vaccine: done 11/08/14 Shingles vaccine: Shingrix discussed. Please contact your pharmacy for coverage information.   Advanced directives: Please bring a copy of your health care power of attorney and living will to the office at your convenience.  Conditions/risks identified: Keep up the great work!  Next appointment: Please follow up in one year for your Medicare Annual Wellness visit.    Preventive Care 25 Years and Older, Male Preventive care refers to lifestyle choices and visits with your health care provider that can promote health and wellness. What does preventive care include?  A yearly physical exam. This is also called an annual well check.  Dental exams once or twice a year.  Routine eye exams. Ask your health care provider how often you should have your eyes checked.  Personal lifestyle choices, including:  Daily care of your teeth and gums.  Regular physical activity.  Eating a healthy diet.  Avoiding tobacco and drug use.  Limiting alcohol use.  Practicing safe sex.  Taking low doses of aspirin every day.  Taking vitamin and mineral supplements as recommended by your health care provider. What happens during an annual well check? The services and screenings done by your health care provider during your annual well check will depend on your age, overall health, lifestyle risk factors, and family history of  disease. Counseling  Your health care provider may ask you questions about your:  Alcohol use.  Tobacco use.  Drug use.  Emotional well-being.  Home and relationship well-being.  Sexual activity.  Eating habits.  History of falls.  Memory and ability to understand (cognition).  Work and work Statistician. Screening  You may have the following tests or measurements:  Height, weight, and BMI.  Blood pressure.  Lipid and cholesterol levels. These may be checked every 5 years, or more frequently if you are over 9 years old.  Skin check.  Lung cancer screening. You may have this screening every year starting at age 27 if you have a 30-pack-year history of smoking and currently smoke or have quit within the past 15 years.  Fecal occult blood test (FOBT) of the stool. You may have this test every year starting at age 4.  Flexible sigmoidoscopy or colonoscopy. You may have a sigmoidoscopy every 5 years or a colonoscopy every 10 years starting at age 35.  Prostate cancer screening. Recommendations will vary depending on your family history and other risks.  Hepatitis C blood test.  Hepatitis B blood test.  Sexually transmitted disease (STD) testing.  Diabetes screening. This is done by checking your blood sugar (glucose) after you have not eaten for a while (fasting). You may have this done every 1-3 years.  Abdominal aortic aneurysm (AAA) screening. You may need this if you are a current or former smoker.  Osteoporosis. You may be screened starting at age 70 if you are at high risk. Talk with your health care provider about your test results, treatment options, and if necessary, the need for more tests. Vaccines  Your health care provider  may recommend certain vaccines, such as:  Influenza vaccine. This is recommended every year.  Tetanus, diphtheria, and acellular pertussis (Tdap, Td) vaccine. You may need a Td booster every 10 years.  Zoster vaccine. You may  need this after age 39.  Pneumococcal 13-valent conjugate (PCV13) vaccine. One dose is recommended after age 56.  Pneumococcal polysaccharide (PPSV23) vaccine. One dose is recommended after age 45. Talk to your health care provider about which screenings and vaccines you need and how often you need them. This information is not intended to replace advice given to you by your health care provider. Make sure you discuss any questions you have with your health care provider. Document Released: 01/09/2016 Document Revised: 09/01/2016 Document Reviewed: 10/14/2015 Elsevier Interactive Patient Education  2017 [place] Prevention in the Home Falls can cause injuries. They can happen to people of all ages. There are many things you can do to make your home safe and to help prevent falls. What can I do on the outside of my home?  Regularly fix the edges of walkways and driveways and fix any cracks.  Remove anything that might make you trip as you walk through a door, such as a raised step or threshold.  Trim any bushes or trees on the path to your home.  Use bright outdoor lighting.  Clear any walking paths of anything that might make someone trip, such as rocks or tools.  Regularly check to see if handrails are loose or broken. Make sure that both sides of any steps have handrails.  Any raised decks and porches should have guardrails on the edges.  Have any leaves, snow, or ice cleared regularly.  Use sand or salt on walking paths during winter.  Clean up any spills in your garage right away. This includes oil or grease spills. What can I do in the bathroom?  Use night lights.  Install grab bars by the toilet and in the tub and shower. Do not use towel bars as grab bars.  Use non-skid mats or decals in the tub or shower.  If you need to sit down in the shower, use a plastic, non-slip stool.  Keep the floor dry. Clean up any water that spills on the floor as soon as it  happens.  Remove soap buildup in the tub or shower regularly.  Attach bath mats securely with double-sided non-slip rug tape.  Do not have throw rugs and other things on the floor that can make you trip. What can I do in the bedroom?  Use night lights.  Make sure that you have a light by your bed that is easy to reach.  Do not use any sheets or blankets that are too big for your bed. They should not hang down onto the floor.  Have a firm chair that has side arms. You can use this for support while you get dressed.  Do not have throw rugs and other things on the floor that can make you trip. What can I do in the kitchen?  Clean up any spills right away.  Avoid walking on wet floors.  Keep items that you use a lot in easy-to-reach places.  If you need to reach something above you, use a strong step stool that has a grab bar.  Keep electrical cords out of the way.  Do not use floor polish or wax that makes floors slippery. If you must use wax, use non-skid floor wax.  Do not have throw rugs  and other things on the floor that can make you trip. What can I do with my stairs?  Do not leave any items on the stairs.  Make sure that there are handrails on both sides of the stairs and use them. Fix handrails that are broken or loose. Make sure that handrails are as long as the stairways.  Check any carpeting to make sure that it is firmly attached to the stairs. Fix any carpet that is loose or worn.  Avoid having throw rugs at the top or bottom of the stairs. If you do have throw rugs, attach them to the floor with carpet tape.  Make sure that you have a light switch at the top of the stairs and the bottom of the stairs. If you do not have them, ask someone to add them for you. What else can I do to help prevent falls?  Wear shoes that:  Do not have high heels.  Have rubber bottoms.  Are comfortable and fit you well.  Are closed at the toe. Do not wear sandals.  If you  use a stepladder:  Make sure that it is fully opened. Do not climb a closed stepladder.  Make sure that both sides of the stepladder are locked into place.  Ask someone to hold it for you, if possible.  Clearly mark and make sure that you can see:  Any grab bars or handrails.  First and last steps.  Where the edge of each step is.  Use tools that help you move around (mobility aids) if they are needed. These include:  Canes.  Walkers.  Scooters.  Crutches.  Turn on the lights when you go into a dark area. Replace any light bulbs as soon as they burn out.  Set up your furniture so you have a clear path. Avoid moving your furniture around.  If any of your floors are uneven, fix them.  If there are any pets around you, be aware of where they are.  Review your medicines with your doctor. Some medicines can make you feel dizzy. This can increase your chance of falling. Ask your doctor what other things that you can do to help prevent falls. This information is not intended to replace advice given to you by your health care provider. Make sure you discuss any questions you have with your health care provider. Document Released: 10/09/2009 Document Revised: 05/20/2016 Document Reviewed: 01/17/2015 Elsevier Interactive Patient Education  2017 [employer].

## 2020-06-06 ENCOUNTER — Telehealth: Payer: Self-pay

## 2020-06-06 NOTE — Telephone Encounter (Signed)
Pt needs appt for refills

## 2020-06-16 ENCOUNTER — Encounter: Payer: Self-pay | Admitting: Family Medicine

## 2020-06-16 ENCOUNTER — Ambulatory Visit: Payer: Medicare PPO | Admitting: Family Medicine

## 2020-06-16 ENCOUNTER — Other Ambulatory Visit: Payer: Self-pay

## 2020-06-16 VITALS — BP 120/64 | HR 67 | Temp 97.9°F | Resp 16 | Ht 74.0 in | Wt 235.0 lb

## 2020-06-16 DIAGNOSIS — E785 Hyperlipidemia, unspecified: Secondary | ICD-10-CM

## 2020-06-16 DIAGNOSIS — E039 Hypothyroidism, unspecified: Secondary | ICD-10-CM | POA: Diagnosis not present

## 2020-06-16 DIAGNOSIS — E781 Pure hyperglyceridemia: Secondary | ICD-10-CM

## 2020-06-16 DIAGNOSIS — Z5181 Encounter for therapeutic drug level monitoring: Secondary | ICD-10-CM | POA: Diagnosis not present

## 2020-06-16 DIAGNOSIS — E786 Lipoprotein deficiency: Secondary | ICD-10-CM

## 2020-06-16 MED ORDER — ROSUVASTATIN CALCIUM 5 MG PO TABS: 5.0000 mg | ORAL_TABLET | Freq: Every day | ORAL | 3 refills | Status: DC

## 2020-06-16 MED ORDER — NIACIN ER (ANTIHYPERLIPIDEMIC) 500 MG PO TBCR: 1500.0000 mg | EXTENDED_RELEASE_TABLET | Freq: Every day | ORAL | 3 refills | Status: DC

## 2020-06-16 MED ORDER — ROSUVASTATIN CALCIUM 5 MG PO TABS: 5.0000 mg | ORAL_TABLET | Freq: Every day | ORAL | 0 refills | Status: DC

## 2020-06-16 NOTE — Progress Notes (Signed)
Name: Alan Holt   MRN: [id number]    DOB: 1951/10/25   Date:06/16/2020       Progress Note  Chief Complaint  Patient presents with  . Follow-up  . Hypothyroidism  . Hyperlipidemia     Subjective:   Alan Holt is a 69 y.o. male, presents to clinic for   Hyperlipidemia: Currently treated with crestor and naicin (? From past PCP he would like to keep taking for his HDL), pt reports good med compliance Last Lipids: Lab Results  Component Value Date   CHOL 121 11/15/2019   HDL 35 (L) 11/15/2019   LDLCALC 58 11/15/2019   TRIG 228 (H) 11/15/2019   CHOLHDL 3.5 11/15/2019   - Denies: Chest pain, shortness of breath, myalgias, claudication  Post-surgical hypothyroid - sees ENT for management, just had dose adjusted from 137 mcg to 150 mcg - hasn't received meds from mail order yet.  Not able to review recent records    Current Outpatient Medications:  .  aspirin 81 MG tablet, Take 81 mg by mouth daily., Disp: , Rfl:  .  calcium citrate (CALCITRATE - DOSED IN MG ELEMENTAL CALCIUM) 950 MG tablet, Take 1,200 mg of elemental calcium by mouth daily., Disp: , Rfl:  .  cholecalciferol (VITAMIN D) 1000 units tablet, Take 1,000 Units by mouth daily., Disp: , Rfl:  .  ibuprofen (ADVIL,MOTRIN) 600 MG tablet, Take 600 mg by mouth every 8 (eight) hours as needed. Pt takes rarely, 1-2 times per week, Disp: , Rfl:  .  levothyroxine (SYNTHROID, LEVOTHROID) 150 MCG tablet, Take 1 tablet every other day by mouth. Patient takes this medication on even days, Disp: , Rfl:  .  levothyroxine (SYNTHROID, LEVOTHROID) 137 MCG tablet, Take 1 tablet every other day by mouth. Patient takes this medication on odd days  (Patient not taking: Reported on 06/16/2020), Disp: , Rfl:  .  niacin (NIASPAN) 500 MG CR tablet, Take 3 tablets (1,500 mg total) by mouth at bedtime., Disp: 270 tablet, Rfl: 3 .  rosuvastatin (CRESTOR) 5 MG tablet, Take 1 tablet (5 mg total) by mouth at bedtime., Disp: 90 tablet, Rfl:  3  Patient Active Problem List   Diagnosis Date Noted  . Sleep apnea 06/01/2018  . Prediabetes 05/10/2018  . Increased prostate specific antigen (PSA) velocity 05/08/2018  . Obesity (BMI 30.0-34.9) 05/08/2018  . Elevated BP without diagnosis of hypertension 11/10/2016  . Hyperlipidemia with low HDL 08/10/2016  . Hyperglycemia 08/10/2016  . Low testosterone 07/20/2016  . Hypertriglyceridemia 01/12/2016  . Low serum HDL 07/09/2015  . Adult hypothyroidism 07/09/2015    Past Surgical History:  Procedure Laterality Date  . HERNIA REPAIR     childhood  . KNEE SURGERY Left 1993  . MOUTH SURGERY  04/05/2018   bone graft, 3 teeth pulled  . THYROID SURGERY  2015  . TOTAL THYROIDECTOMY Bilateral 07/15/2014   Dr. Kathyrn Sheriff.     Family History  Problem Relation Age of Onset  . Diabetes Mother   . Alcohol abuse Father   . Stroke Father   . Diabetes Brother   . Heart attack Brother     Social History   Tobacco Use  . Smoking status: Never Smoker  . Smokeless tobacco: Never Used  Vaping Use  . Vaping Use: Never used  Substance Use Topics  . Alcohol use: No    Alcohol/week: 0.0 standard drinks  . Drug use: No     Allergies  Allergen Reactions  . Erythromycin Rash  Health Maintenance  Topic Date Due  . INFLUENZA VACCINE  07/27/2020  . COLONOSCOPY  08/09/2023  . TETANUS/TDAP  11/08/2024  . COVID-19 Vaccine  Completed  . Hepatitis C Screening  Completed  . PNA vac Low Risk Adult  Completed    Chart Review Today: I personally reviewed active problem list, medication list, allergies, family history, social history, health maintenance, notes from last encounter, lab results, imaging with the patient/caregiver today.  ENT records and labs requested today  Review of Systems  10 Systems reviewed and are negative for acute change except as noted in the HPI.   Objective:   Vitals:   06/16/20 0950  BP: 120/64  Pulse: 67  Resp: 16  Temp: 97.9 F (36.6 C)  TempSrc:  Temporal  SpO2: 98%  Weight: 235 lb (106.6 kg)  Height: 6\' 2"  (1.88 m)    Body mass index is 30.17 kg/m.  Physical Exam Vitals and nursing note reviewed.  Constitutional:      General: He is not in acute distress.    Appearance: Normal appearance. He is not ill-appearing, toxic-appearing or diaphoretic.  HENT:     Head: Normocephalic and atraumatic.  Eyes:     General: No scleral icterus.       Right eye: No discharge.        Left eye: No discharge.     Conjunctiva/sclera: Conjunctivae normal.  Cardiovascular:     Rate and Rhythm: Normal rate and regular rhythm.     Pulses: Normal pulses.     Heart sounds: Normal heart sounds.  Pulmonary:     Effort: Pulmonary effort is normal.     Breath sounds: Normal breath sounds.  Abdominal:     General: Bowel sounds are normal.     Palpations: Abdomen is soft.  Musculoskeletal:     Right lower leg: No edema.     Left lower leg: No edema.  Skin:    General: Skin is warm and dry.     Coloration: Skin is not jaundiced or pale.  Neurological:     Mental Status: He is alert. Mental status is at baseline.  Psychiatric:        Mood and Affect: Mood normal.        Behavior: Behavior normal.         Assessment & Plan:   1. Hyperlipidemia with low HDL Compliant with meds, no SE, no myalgias, fatigue or jaundice Due for lipid panel and recheck CMP- if lipid panel stable - can do annually Discussed adding vascepa or lovaza for low HDL and elevated triglycerides - he would like to continue naicin Diet and exercise recommendations for HLD reviewed and handout given - Lipid panel & CMP  2. Adult hypothyroidism Per ENT - request records for last OV and labs Dose change to 150 mcg daily  3. Hypertriglyceridemia Fasting today, encouraged to work on decreasing carbs in diet - niacin (NIASPAN) 500 MG CR tablet; Take 3 tablets (1,500 mg total) by mouth at bedtime.  Dispense: 270 tablet; Refill: 3 - rosuvastatin (CRESTOR) 5 MG tablet;  Take 1 tablet (5 mg total) by mouth at bedtime.  Dispense: 90 tablet; Refill: 3  4. Encounter for medication monitoring  - COMPLETE METABOLIC PANEL WITH GFR - lipid  Return in about 6 months (around 12/16/2020) for Routine follow-up.   Delsa Grana, PA-C 06/16/20 10:09 AM

## 2020-06-17 ENCOUNTER — Other Ambulatory Visit: Payer: Self-pay | Admitting: Family Medicine

## 2020-06-17 DIAGNOSIS — E786 Lipoprotein deficiency: Secondary | ICD-10-CM

## 2020-06-17 DIAGNOSIS — E781 Pure hyperglyceridemia: Secondary | ICD-10-CM

## 2020-06-17 LAB — COMPLETE METABOLIC PANEL WITH GFR
AG Ratio: 2.2 (calc) (ref 1.0–2.5)
ALT: 40 U/L (ref 9–46)
AST: 28 U/L (ref 10–35)
Albumin: 4.6 g/dL (ref 3.6–5.1)
Alkaline phosphatase (APISO): 68 U/L (ref 35–144)
BUN: 23 mg/dL (ref 7–25)
CO2: 26 mmol/L (ref 20–32)
Calcium: 9.8 mg/dL (ref 8.6–10.3)
Chloride: 108 mmol/L (ref 98–110)
Creat: 0.94 mg/dL (ref 0.70–1.18)
GFR, Est African American: 95 mL/min/{1.73_m2} (ref >=60)
GFR, Est Non African American: 82 mL/min/{1.73_m2} (ref >=60)
Globulin: 2.1 g/dL (calc) (ref 1.9–3.7)
Glucose, Bld: 125 mg/dL — ABNORMAL HIGH (ref 65–99)
Potassium: 4.1 mmol/L (ref 3.5–5.3)
Sodium: 143 mmol/L (ref 135–146)
Total Bilirubin: 0.6 mg/dL (ref 0.2–1.2)
Total Protein: 6.7 g/dL (ref 6.1–8.1)

## 2020-06-17 LAB — LIPID PANEL
Cholesterol: 117 mg/dL (ref <200)
HDL: 40 mg/dL (ref >=40)
LDL Cholesterol (Calc): 50 mg/dL (calc)
Non-HDL Cholesterol (Calc): 77 mg/dL (calc) (ref <130)
Total CHOL/HDL Ratio: 2.9 (calc) (ref <5.0)
Triglycerides: 200 mg/dL — ABNORMAL HIGH (ref <150)

## 2020-06-17 MED ORDER — ROSUVASTATIN CALCIUM 5 MG PO TABS: 5.0000 mg | ORAL_TABLET | Freq: Every day | ORAL | 0 refills | Status: DC

## 2020-06-18 ENCOUNTER — Telehealth: Payer: Self-pay

## 2020-06-18 DIAGNOSIS — R7301 Impaired fasting glucose: Secondary | ICD-10-CM

## 2020-06-18 NOTE — Telephone Encounter (Signed)
-----   Message from Delsa Grana, [place] sent at 06/17/2020  5:36 PM EDT ----- Please add on A1C for impaired fasting glucose  LDL level is good, trigs and blood sugar elevated - can let him know I'm looking into his average blood sugar- decreasing blood sugar and sugar/carbs in diet can improve blood sugar and triglycerides  His HDL was good, kidney and liver function normal, and electrolytes good as well

## 2020-06-19 ENCOUNTER — Other Ambulatory Visit: Payer: Self-pay

## 2020-06-19 DIAGNOSIS — E781 Pure hyperglyceridemia: Secondary | ICD-10-CM

## 2020-06-19 DIAGNOSIS — E786 Lipoprotein deficiency: Secondary | ICD-10-CM

## 2020-06-19 MED ORDER — ROSUVASTATIN CALCIUM 5 MG PO TABS: 5.0000 mg | ORAL_TABLET | Freq: Every day | ORAL | 0 refills | Status: DC

## 2020-07-18 ENCOUNTER — Encounter: Payer: Self-pay | Admitting: Family Medicine

## 2020-12-16 ENCOUNTER — Ambulatory Visit: Payer: Medicare PPO | Admitting: Family Medicine

## 2021-01-14 ENCOUNTER — Other Ambulatory Visit: Payer: Self-pay

## 2021-01-14 ENCOUNTER — Telehealth (INDEPENDENT_AMBULATORY_CARE_PROVIDER_SITE_OTHER): Payer: Medicare PPO | Admitting: Family Medicine

## 2021-01-14 ENCOUNTER — Encounter: Payer: Self-pay | Admitting: Family Medicine

## 2021-01-14 VITALS — Wt 231.0 lb

## 2021-01-14 DIAGNOSIS — E039 Hypothyroidism, unspecified: Secondary | ICD-10-CM | POA: Diagnosis not present

## 2021-01-14 DIAGNOSIS — G4730 Sleep apnea, unspecified: Secondary | ICD-10-CM

## 2021-01-14 DIAGNOSIS — R7303 Prediabetes: Secondary | ICD-10-CM | POA: Diagnosis not present

## 2021-01-14 DIAGNOSIS — E786 Lipoprotein deficiency: Secondary | ICD-10-CM

## 2021-01-14 DIAGNOSIS — Z5181 Encounter for therapeutic drug level monitoring: Secondary | ICD-10-CM

## 2021-01-14 DIAGNOSIS — E785 Hyperlipidemia, unspecified: Secondary | ICD-10-CM

## 2021-01-14 DIAGNOSIS — R972 Elevated prostate specific antigen [PSA]: Secondary | ICD-10-CM

## 2021-01-14 DIAGNOSIS — R7989 Other specified abnormal findings of blood chemistry: Secondary | ICD-10-CM

## 2021-01-14 NOTE — Assessment & Plan Note (Signed)
Compliant with CPAP 

## 2021-01-14 NOTE — Assessment & Plan Note (Signed)
Last labs done June 2021 - LDL well controlled, triglycerides elevated with blood glucose Compliant with meds, continue crestor 5 mg daily, no SE, no myalgias, fatigue or jaundice

## 2021-01-14 NOTE — Assessment & Plan Note (Signed)
Recheck labs 

## 2021-01-14 NOTE — Assessment & Plan Note (Signed)
Needs A1C rechecked, reviewed with pt and educated him about A1C, prediabetes vs new onset T2DM and management

## 2021-01-14 NOTE — Progress Notes (Signed)
**Note Alan-Identified via Obfuscation** Name: Alan Holt   MRN: [id number]    DOB: 1951/07/19   Date:01/14/2021       Progress Note  Subjective:    I connected with  Alan Holt  on 01/14/21 at  1:00 PM EST by a video enabled telemedicine application and verified that I am speaking with the correct person using two identifiers.  I discussed the limitations of evaluation and management by telemedicine and the availability of in person appointments. The patient expressed understanding and agreed to proceed. Staff also discussed with the patient that there may be a patient responsible charge related to this service. Patient Location: home Provider Location: home office Additional Individuals present: none  Chief Complaint  Patient presents with  . Hyperlipidemia    Follow up    Alan Holt is a 70 y.o. male, presents for virtual visit for routine follow up on the conditions listed above.  Hypothyroid post-syrgical still managed by Dr. Dionicio Stall Levothyroxine changed in the last year to taking 150 mcg daily, prior to that was alternating 137 and 150 mcg  He sees specialist about once a year Current Symptoms: denies fatigue, weight changes, heat/cold intolerance, bowel/skin changes or CVS symptoms I cannot see labs from specialist Surgery about 6-7 years ago  Hyperlipidemia: Currently treated with crestor 5 mg daily, pt reports good med compliance Last Lipids: Lab Results  Component Value Date   CHOL 117 06/16/2020   HDL 40 06/16/2020   LDLCALC 50 06/16/2020   TRIG 200 (H) 06/16/2020   CHOLHDL 2.9 06/16/2020   - Denies: Chest pain, shortness of breath, myalgias, claudication  231 - weight today  Hx of elevated PSA - pt states when it was repeated is was back to normal, no hx of prostate CA or BPH - doesn't believe he ever saw a specialist Lab Results  Component Value Date   PSA 1.6 05/08/2018   PSA 2.2 02/01/2018   PSA 1.4 02/01/2017   Hyperglycemia - last labs glucose elevated - fasting labs, f/up A1C was  ordered but not done yet, hx of prediabetes Lab Results  Component Value Date   HGBA1C 5.9 (H) 11/15/2019   Hx of Low testosterone - pt would like to have this rechecked - has never done any testosterone replacement   Patient Active Problem List   Diagnosis Date Noted  . Sleep apnea 06/01/2018  . Prediabetes 05/10/2018  . Increased prostate specific antigen (PSA) velocity 05/08/2018  . Obesity (BMI 30.0-34.9) 05/08/2018  . Elevated BP without diagnosis of hypertension 11/10/2016  . Hyperlipidemia with low HDL 08/10/2016  . Hyperglycemia 08/10/2016  . Low testosterone 07/20/2016  . Hypertriglyceridemia 01/12/2016  . Low serum HDL 07/09/2015  . Adult hypothyroidism 07/09/2015    Current Outpatient Medications:  .  aspirin 81 MG tablet, Take 81 mg by mouth daily., Disp: , Rfl:  .  calcium citrate (CALCITRATE - DOSED IN MG ELEMENTAL CALCIUM) 950 MG tablet, Take 1,200 mg of elemental calcium by mouth daily., Disp: , Rfl:  .  cholecalciferol (VITAMIN D) 1000 units tablet, Take 1,000 Units by mouth daily., Disp: , Rfl:  .  ibuprofen (ADVIL,MOTRIN) 600 MG tablet, Take 600 mg by mouth every 8 (eight) hours as needed. Pt takes rarely, 1-2 times per week, Disp: , Rfl:  .  levothyroxine (SYNTHROID, LEVOTHROID) 150 MCG tablet, Take 1 tablet every other day by mouth. Patient takes this medication on even days, Disp: , Rfl:  .  niacin (NIASPAN) 500 MG CR tablet, Take 3  tablets (1,500 mg total) by mouth at bedtime., Disp: 270 tablet, Rfl: 3 .  rosuvastatin (CRESTOR) 5 MG tablet, Take 1 tablet (5 mg total) by mouth at bedtime., Disp: 90 tablet, Rfl: 3 Allergies  Allergen Reactions  . Erythromycin Rash    Past Surgical History:  Procedure Laterality Date  . HERNIA REPAIR     childhood  . KNEE SURGERY Left 1993  . MOUTH SURGERY  04/05/2018   bone graft, 3 teeth pulled  . THYROID SURGERY  2015  . TOTAL THYROIDECTOMY Bilateral 07/15/2014   Dr. Kathyrn Sheriff.    Family History  Problem Relation  Age of Onset  . Diabetes Mother   . Alcohol abuse Father   . Stroke Father   . Diabetes Brother   . Heart attack Brother    Social History   Socioeconomic History  . Marital status: Married    Spouse name: Not on file  . Number of children: Not on file  . Years of education: Not on file  . Highest education level: Master's degree (e.g., MA, MS, MEng, MEd, MSW, MBA)  Occupational History    Comment: retired PE teacher  Tobacco Use  . Smoking status: Never Smoker  . Smokeless tobacco: Never Used  Vaping Use  . Vaping Use: Never used  Substance and Sexual Activity  . Alcohol use: No    Alcohol/week: 0.0 standard drinks  . Drug use: No  . Sexual activity: Not Currently  Other Topics Concern  . Not on file  Social History Narrative  . Not on file   Social Determinants of Health   Financial Resource Strain: Not on file  Food Insecurity: No Food Insecurity  . Worried About Charity fundraiser in the Last Year: Never true  . Ran Out of Food in the Last Year: Never true  Transportation Needs: No Transportation Needs  . Lack of Transportation (Medical): No  . Lack of Transportation (Non-Medical): No  Physical Activity: Sufficiently Active  . Days of Exercise per Week: 5 days  . Minutes of Exercise per Session: 60 min  Stress: No Stress Concern Present  . Feeling of Stress : Only a little  Social Connections: Moderately Integrated  . Frequency of Communication with Friends and Family: More than three times a week  . Frequency of Social Gatherings with Friends and Family: Three times a week  . Attends Religious Services: More than 4 times per year  . Active Member of Clubs or Organizations: No  . Attends Archivist Meetings: Never  . Marital Status: Married  Human resources officer Violence: Not on file    Chart Review Today: I personally reviewed active problem list, medication list, allergies, family history, social history, health maintenance, notes from last  encounter, lab results, imaging with the patient/caregiver today.   Review of Systems  Constitutional: Negative.   HENT: Negative.   Eyes: Negative.   Respiratory: Negative.   Cardiovascular: Negative.   Gastrointestinal: Negative.   Endocrine: Negative.   Genitourinary: Negative.   Musculoskeletal: Negative.   Skin: Negative.   Allergic/Immunologic: Negative.   Neurological: Negative.   Hematological: Negative.   Psychiatric/Behavioral: Negative.   All other systems reviewed and are negative.     Objective:    Virtual encounter, vitals limited, only able to obtain the following There were no vitals filed for this visit. There is no height or weight on file to calculate BMI. Nursing Note and Vital Signs reviewed.  Physical Exam Vitals and nursing note reviewed.  Pulmonary:  Effort: No respiratory distress.  Neurological:     Mental Status: He is alert.  Psychiatric:        Mood and Affect: Mood normal.        Behavior: Behavior normal.     PE limited by telephone encounter  No results found for this or any previous visit (from the past 72 hour(s)).  PHQ2/9: Depression screen [facility] 01/14/2021 06/16/2020 02/12/2020 11/13/2019 05/10/2019  Decreased Interest 0 0 0 0 0  Down, Depressed, Hopeless 0 0 0 0 0  PHQ - 2 Score 0 0 0 0 0  Altered sleeping - 0 - 0 0  Tired, decreased energy - 0 - 0 0  Change in appetite - 0 - 0 0  Feeling bad or failure about yourself  - 0 - 0 0  Trouble concentrating - 0 - 0 0  Moving slowly or fidgety/restless - 0 - 0 0  Suicidal thoughts - 0 - 0 0  PHQ-9 Score - 0 - 0 0  Difficult doing work/chores - Not difficult at all - Not difficult at all Not difficult at all   PHQ-2/9 Result is neg and reviewed today  Fall Risk: Fall Risk  01/14/2021 06/16/2020 02/12/2020 11/13/2019 05/10/2019  Falls in the past year? 0 0 0 0 0  Number falls in past yr: 0 0 0 0 -  Injury with Fall? 0 0 0 0 -  Risk for fall due to : - - No Fall Risks - -   Follow up Falls evaluation completed - Falls prevention discussed - -     Assessment and Plan:   Problem List Items Addressed This Visit      Respiratory   Sleep apnea    Compliant with CPAP      Relevant Orders   CBC with Differential/Platelet     Endocrine   Adult hypothyroidism    Secondary to surgery, labs and meds are monitored by ENT      Relevant Orders   CBC with Differential/Platelet   COMPLETE METABOLIC PANEL WITH GFR     Other   Hyperlipidemia with low HDL (Chronic)    Last labs done June 2021 - LDL well controlled, triglycerides elevated with blood glucose Compliant with meds, continue crestor 5 mg daily, no SE, no myalgias, fatigue or jaundice       Relevant Orders   COMPLETE METABOLIC PANEL WITH GFR   Low testosterone    Recheck labs      Relevant Orders   CBC with Differential/Platelet   Testosterone   Increased prostate specific antigen (PSA) velocity   Prediabetes    Needs A1C rechecked, reviewed with pt and educated him about A1C, prediabetes vs new onset T2DM and management      Relevant Orders   COMPLETE METABOLIC PANEL WITH GFR   Hemoglobin A1c    Other Visit Diagnoses    Encounter for medication monitoring    -  Primary   Relevant Orders   CBC with Differential/Platelet   COMPLETE METABOLIC PANEL WITH GFR   Hemoglobin A1c   Testosterone     Pt to come get labs done 8 am to before 10 am If Testosterone is low will need repeat lab - this was explained to him.  I discussed the assessment and treatment plan with the patient. The patient was provided an opportunity to ask questions and all were answered. The patient agreed with the plan and demonstrated an understanding of the instructions.  The patient was  advised to call back or seek an in-person evaluation if the symptoms worsen or if the condition fails to improve as anticipated.  I provided 30+ minutes of non-face-to-face time during this encounter.  Delsa Grana,  PA-C 01/14/21 1:29 PM

## 2021-01-14 NOTE — Assessment & Plan Note (Signed)
Secondary to surgery, labs and meds are monitored by ENT

## 2021-01-20 ENCOUNTER — Other Ambulatory Visit: Payer: Self-pay

## 2021-01-20 ENCOUNTER — Ambulatory Visit: Payer: Medicare PPO | Admitting: Emergency Medicine

## 2021-01-20 VITALS — BP 128/76 | HR 72 | Resp 18 | Wt 231.0 lb

## 2021-01-20 DIAGNOSIS — Z5181 Encounter for therapeutic drug level monitoring: Secondary | ICD-10-CM

## 2021-01-21 LAB — TESTOSTERONE: Testosterone: 153 ng/dL — ABNORMAL LOW (ref 250–827)

## 2021-01-21 LAB — CBC WITH DIFFERENTIAL/PLATELET
Absolute Monocytes: 493 cells/uL (ref 200–950)
Basophils Absolute: 50 cells/uL (ref 0–200)
Basophils Relative: 0.9 %
Eosinophils Absolute: 73 cells/uL (ref 15–500)
Eosinophils Relative: 1.3 %
HCT: 49.6 % (ref 38.5–50.0)
Hemoglobin: 17.3 g/dL — ABNORMAL HIGH (ref 13.2–17.1)
Lymphs Abs: 1960 cells/uL (ref 850–3900)
MCH: 31.3 pg (ref 27.0–33.0)
MCHC: 34.9 g/dL (ref 32.0–36.0)
MCV: 89.9 fL (ref 80.0–100.0)
MPV: 9.5 fL (ref 7.5–12.5)
Monocytes Relative: 8.8 %
Neutro Abs: 3024 cells/uL (ref 1500–7800)
Neutrophils Relative %: 54 %
Platelets: 216 10*3/uL (ref 140–400)
RBC: 5.52 10*6/uL (ref 4.20–5.80)
RDW: 13 % (ref 11.0–15.0)
Total Lymphocyte: 35 %
WBC: 5.6 10*3/uL (ref 3.8–10.8)

## 2021-01-21 LAB — COMPLETE METABOLIC PANEL WITH GFR
AG Ratio: 2.1 (calc) (ref 1.0–2.5)
ALT: 39 U/L (ref 9–46)
AST: 28 U/L (ref 10–35)
Albumin: 4.7 g/dL (ref 3.6–5.1)
Alkaline phosphatase (APISO): 78 U/L (ref 35–144)
BUN: 20 mg/dL (ref 7–25)
CO2: 27 mmol/L (ref 20–32)
Calcium: 9.9 mg/dL (ref 8.6–10.3)
Chloride: 105 mmol/L (ref 98–110)
Creat: 0.98 mg/dL (ref 0.70–1.18)
GFR, Est African American: 90 mL/min/{1.73_m2} (ref >=60)
GFR, Est Non African American: 78 mL/min/{1.73_m2} (ref >=60)
Globulin: 2.2 g/dL (calc) (ref 1.9–3.7)
Glucose, Bld: 156 mg/dL — ABNORMAL HIGH (ref 65–99)
Potassium: 4.5 mmol/L (ref 3.5–5.3)
Sodium: 141 mmol/L (ref 135–146)
Total Bilirubin: 0.8 mg/dL (ref 0.2–1.2)
Total Protein: 6.9 g/dL (ref 6.1–8.1)

## 2021-01-21 LAB — HEMOGLOBIN A1C
Hgb A1c MFr Bld: 7.3 % of total Hgb — ABNORMAL HIGH (ref <5.7)
Mean Plasma Glucose: 163 mg/dL
eAG (mmol/L): 9 mmol/L

## 2021-01-21 NOTE — Addendum Note (Signed)
Addended by: Delsa Grana on: 01/21/2021 01:57 PM   Modules accepted: Orders

## 2021-02-06 ENCOUNTER — Encounter: Payer: Self-pay | Admitting: Family Medicine

## 2021-02-06 ENCOUNTER — Telehealth (INDEPENDENT_AMBULATORY_CARE_PROVIDER_SITE_OTHER): Payer: Medicare PPO | Admitting: Family Medicine

## 2021-02-06 ENCOUNTER — Telehealth: Payer: Self-pay | Admitting: *Deleted

## 2021-02-06 VITALS — Ht 74.0 in | Wt 230.0 lb

## 2021-02-06 DIAGNOSIS — E785 Hyperlipidemia, unspecified: Secondary | ICD-10-CM

## 2021-02-06 DIAGNOSIS — E119 Type 2 diabetes mellitus without complications: Secondary | ICD-10-CM | POA: Insufficient documentation

## 2021-02-06 DIAGNOSIS — E1165 Type 2 diabetes mellitus with hyperglycemia: Secondary | ICD-10-CM

## 2021-02-06 DIAGNOSIS — R7989 Other specified abnormal findings of blood chemistry: Secondary | ICD-10-CM

## 2021-02-06 DIAGNOSIS — D582 Other hemoglobinopathies: Secondary | ICD-10-CM

## 2021-02-06 DIAGNOSIS — E786 Lipoprotein deficiency: Secondary | ICD-10-CM

## 2021-02-06 DIAGNOSIS — E039 Hypothyroidism, unspecified: Secondary | ICD-10-CM

## 2021-02-06 MED ORDER — LANCETS MISC: 3 refills | Status: DC

## 2021-02-06 MED ORDER — BLOOD GLUCOSE TEST VI STRP: ORAL_STRIP | 3 refills | Status: DC

## 2021-02-06 NOTE — Chronic Care Management (AMB) (Signed)
  Chronic Care Management   Note  02/06/2021 Name: CORGAN MORMILE MRN: [id number] DOB: 1951/10/18  Kennis Carina [place] is a 70 y.o. year old male who is a primary care patient of Delsa Grana, [place]. I reached out to [name] by phone today in response to a referral sent by Mr. Kennis Carina Kauer's PCP, Delsa Grana, PA-C.  Mr. Pellegrin was given information about Chronic Care Management services today including:  1. CCM service includes personalized support from designated clinical staff supervised by his physician, including individualized plan of care and coordination with other care providers 2. 24/7 contact phone numbers for assistance for urgent and routine care needs. 3. Service will only be billed when office clinical staff spend 20 minutes or more in a month to coordinate care. 4. Only one practitioner may furnish and bill the service in a calendar month. 5. The patient may stop CCM services at any time (effective at the end of the month) by phone call to the office staff. 6. The patient will be responsible for cost sharing (co-pay) of up to 20% of the service fee (after annual deductible is met).  Patient agreed to services and verbal consent obtained.   Follow up plan: Face to Face appointment with care management team member scheduled for: 02/11/2021  [facility]  Direct Dial: [phone]

## 2021-02-06 NOTE — Progress Notes (Signed)
Name: Alan Holt   MRN: [id number]    DOB: 01/03/1951   Date:02/06/2021       Progress Note  Subjective:    Chief Complaint  Chief Complaint  Patient presents with  . abnormal labs  . Diabetes    New onset    . testosterone    Low     I connected with  DETRAVION TESTER  on 02/06/21 at  1:00 PM EST by a video enabled telemedicine application and verified that I am speaking with the correct person using two identifiers.  I discussed the limitations of evaluation and management by telemedicine and the availability of in person appointments. The patient expressed understanding and agreed to proceed. Staff also discussed with the patient that there may be a patient responsible charge related to this service. Patient Location: home Provider Location: [facility] [place]  Additional Individuals present: Wife   HPI   F/up on abnormal labs:  New onset DM: He needs to work on diet Cut out carbs, starches potatoes, flour, biscuits Lab Results  Component Value Date   HGBA1C 7.3 (H) 01/20/2021   HGBA1C 5.9 (H) 11/15/2019   HGBA1C 5.6 11/09/2018  DM in family with mother and one of his brothers    Hemoglobin was high, minimally -  Lab Results  Component Value Date   WBC 5.6 01/20/2021   HGB 17.3 (H) 01/20/2021   HCT 49.6 01/20/2021   MCV 89.9 01/20/2021   PLT 216 01/20/2021    Low T-  Lab Results  Component Value Date   TESTOSTERONE 153 (L) 01/20/2021        Patient Active Problem List   Diagnosis Date Noted  . New onset type 2 diabetes mellitus ([place]) 02/06/2021  . Sleep apnea 06/01/2018  . Prediabetes 05/10/2018  . Increased prostate specific antigen (PSA) velocity 05/08/2018  . Obesity (BMI 30.0-34.9) 05/08/2018  . Hyperlipidemia with low HDL 08/10/2016  . Low testosterone 07/20/2016  . Adult hypothyroidism 07/09/2015    Social History   Tobacco Use  . Smoking status: Never Smoker  . Smokeless tobacco: Never Used  Substance Use Topics  . Alcohol use: No     Alcohol/week: 0.0 standard drinks     Current Outpatient Medications:  .  aspirin 81 MG tablet, Take 81 mg by mouth daily., Disp: , Rfl:  .  cholecalciferol (VITAMIN D) 1000 units tablet, Take 1,000 Units by mouth daily., Disp: , Rfl:  .  ibuprofen (ADVIL,MOTRIN) 600 MG tablet, Take 600 mg by mouth every 8 (eight) hours as needed. Pt takes rarely, 1-2 times per week, Disp: , Rfl:  .  levothyroxine (SYNTHROID, LEVOTHROID) 150 MCG tablet, Take 1 tablet by mouth every other day., Disp: , Rfl:  .  niacin (NIASPAN) 500 MG CR tablet, Take 3 tablets (1,500 mg total) by mouth at bedtime., Disp: 270 tablet, Rfl: 3 .  rosuvastatin (CRESTOR) 5 MG tablet, Take 1 tablet (5 mg total) by mouth at bedtime., Disp: 90 tablet, Rfl: 3 .  calcium citrate (CALCITRATE - DOSED IN MG ELEMENTAL CALCIUM) 950 MG tablet, Take 1,200 mg of elemental calcium by mouth daily., Disp: , Rfl:   Allergies  Allergen Reactions  . Erythromycin Rash    I personally reviewed active problem list, medication list, allergies, family history, social history, health maintenance, notes from last encounter, lab results, imaging with the patient/caregiver today.   Review of Systems  Constitutional: Negative.   HENT: Negative.   Eyes: Negative.   Respiratory: Negative.  Cardiovascular: Negative.   Gastrointestinal: Negative.   Endocrine: Negative.   Genitourinary: Negative.   Musculoskeletal: Negative.   Skin: Negative.   Allergic/Immunologic: Negative.   Neurological: Negative.   Hematological: Negative.   Psychiatric/Behavioral: Negative.   All other systems reviewed and are negative.     Objective:   Virtual encounter, vitals limited, only able to obtain the following Today's Vitals   02/06/21 1313  Weight: 230 lb (104.3 kg)  Height: 6\' 2"  (1.88 m)   Body mass index is 29.53 kg/m. Nursing Note and Vital Signs reviewed.  Physical Exam Vitals and nursing note reviewed.  Constitutional:      General: He is not  in acute distress.    Appearance: He is not ill-appearing, toxic-appearing or diaphoretic.  Neurological:     Mental Status: He is alert.     PE limited by virtual encounter  No results found for this or any previous visit (from the past 72 hour(s)).  Assessment and Plan:     ICD-10-CM   1. New onset type 2 diabetes mellitus ([place])  E11.9 CBC with Differential/Platelet    COMPLETE METABOLIC PANEL WITH GFR    Lipid panel    Hemoglobin A1c    Microalbumin, urine    For home use only DME Other see comment    Glucose Blood (BLOOD GLUCOSE TEST STRIPS) STRP    Lancets MISC    AMB Referral to [facility]    DISCONTINUED: Lancets MISC    DISCONTINUED: Glucose Blood (BLOOD GLUCOSE TEST STRIPS) STRP   see below - plan to work on diet, get meter, work with Transport planner, f/up in 3 months with labs and address caps and standard of care with T2DM  2. Adult hypothyroidism  E03.9    per specialists  3. Low testosterone  R79.89    repeat testosterone pending, refer to urology for consult if again low T  4. Hyperlipidemia with low HDL  O27.0 COMPLETE METABOLIC PANEL WITH GFR   E78.6 Lipid panel   Labs will be due by June - will get lipid panel with labs planned for April and DM f/up appt  5. Elevated hemoglobin ([facility])  D58.2 CBC with Differential/Platelet   recheck, mild, he's making sure he is well hydrated, has OSA but is on CPAP  6. Uncontrolled type 2 diabetes mellitus with hyperglycemia ([facility])  J50.09 COMPLETE METABOLIC PANEL WITH GFR    Hemoglobin A1c    Microalbumin, urine    For home use only DME Other see comment    Glucose Blood (BLOOD GLUCOSE TEST STRIPS) STRP    Lancets MISC    AMB Referral to [place]      - I discussed the assessment and treatment plan with the patient. The patient was provided an opportunity to ask questions and all were answered. The patient agreed with the plan and demonstrated an understanding of the instructions.  I  provided 30+ minutes of non-face-to-face time during this encounter.  Delsa Grana, PA-C 02/06/21 1:33 PM

## 2021-02-06 NOTE — Patient Instructions (Signed)
Type 2 Diabetes Mellitus, Diagnosis, Adult Type 2 diabetes (type 2 diabetes mellitus) is a long-term disease. It may happen when there is one or both of these problems:  The pancreas does not make enough insulin.  The body does not react in a normal way to insulin that it makes. Insulin lets sugars go into cells in your body. If you have type 2 diabetes, sugars cannot get into your cells. Sugars build up in the blood. This causes high blood sugar. What are the causes? The exact cause of this condition is not known. What increases the risk? The following factors may make you more likely to develop this condition:  Having type 2 diabetes in your family.  Being overweight or very overweight.  Not being active.  Your body not reacting in a normal way to the insulin it makes.  Having higher than normal blood sugar over time.  Having a type of diabetes when you were pregnant.  Having a condition that causes small fluid-filled sacs on your ovaries. What are the signs or symptoms? At first, you may have no symptoms. You will get symptoms slowly. They may include:  More thirst than normal.  More hunger than normal.  Needing to pee more than normal.  Losing weight without trying.  Feeling tired.  Feeling weak.  Seeing things blurry.  Dark patches on your skin. How is this treated? This condition may be treated by a diabetes expert. You may need to:  Follow an eating plan made by a food expert (dietitian).  Get regular exercise.  Find ways to deal with stress.  Check blood sugar as often as told.  Take medicines. Your doctor will set treatment goals for you. Your blood sugar should be at these levels:  Before meals: 80-130 mg/dL (4.4-7.2 mmol/L).  After meals: below 180 mg/dL (10 mmol/L).  Over the last 2-3 months: less than 7%. Follow these instructions at home: Medicines  Take your diabetes medicines or insulin every day.  Take medicines to help you not get  other problems caused by this condition. You may need: ? Aspirin. ? Medicine to lower cholesterol. ? Medicine to control blood pressure. Questions to ask your doctor  Should I meet with a diabetes educator?  What medicines do I need, and when should I take them?  What will I need to treat my condition at home?  When should I check my blood sugar?  Where can I find a support group?  Who can I call if I have questions?  When is my next doctor visit? General instructions  Take over-the-counter and prescription medicines only as told by your doctor.  Keep all follow-up visits as told by your doctor. This is important. Where to find more information  American Diabetes Association (ADA): www.diabetes.org  American Association of Diabetes Care and Education Specialists (ADCES): www.diabeteseducator.org  International Diabetes Federation (IDF): MemberVerification.ca Contact a doctor if:  Your blood sugar is at or above 240 mg/dL (13.3 mmol/L) for 2 days in a row.  You have been sick for 2 days or more, and you are not getting better.  You have had a fever for 2 days or more, and you are not getting better.  You have any of these problems for more than 6 hours: ? You cannot eat or drink. ? You feel like you may vomit. ? You vomit. ? You have watery poop (diarrhea). Get help right away if:  Your blood sugar is very low. This means it is lower  than 54 mg/dL (3 mmol/L).  You feel mixed up (confused).  You have trouble thinking clearly.  You have trouble breathing.  You have medium or large ketone levels in your pee. These symptoms may be an emergency. Do not wait to see if the symptoms will go away. Get medical help right away. Call your local emergency services (911 in the [place] Do not drive yourself to the hospital. Summary  Type 2 diabetes is a long-term disease. Your pancreas may not make enough insulin, or your body may not react in a normal way to insulin that it  makes.  This condition is treated with an eating plan, lifestyle changes, and medicines.  Your doctor will set treatment goals for you. These will help you keep your blood sugar in a healthy range.  Keep all follow-up visits as told by your doctor. This is important. This information is not intended to replace advice given to you by your health care provider. Make sure you discuss any questions you have with your health care provider. Document Revised: 07/09/2020 Document Reviewed: 07/09/2020 Elsevier Patient Education  2021 [place].   Blood Glucose Monitoring, Adult Monitoring your blood sugar (glucose) is an important part of managing your diabetes. Blood glucose monitoring involves checking your blood glucose as often as directed and keeping a log or record of your results over time. Checking your blood glucose regularly and keeping a blood glucose log can:  Help you and your health care provider adjust your diabetes management plan as needed, including your medicines or insulin.  Help you understand how food, exercise, illnesses, and medicines affect your blood glucose.  Let you know what your blood glucose is at any time. You can quickly find out if you have low blood glucose (hypoglycemia) or high blood glucose (hyperglycemia). Your health care provider will set individualized treatment goals for you. Your goals will be based on your age, other medical conditions you have, and how you respond to diabetes treatment. Generally, the goal of treatment is to maintain the following blood glucose levels:  Before meals (preprandial): 80-130 mg/dL (4.4-7.2 mmol/L).  After meals (postprandial): below 180 mg/dL (10 mmol/L).  A1C level: less than 7%. Supplies needed:  Blood glucose meter.  Test strips for your meter. Each meter has its own strips. You must use the strips that came with your meter.  A needle to prick your finger (lancet). Do not use a lancet more than one time.  A  device that holds the lancet (lancing device).  A journal or log book to write down your results. How to check your blood glucose Checking your blood glucose 1. Wash your hands for at least 20 seconds with soap and water. 2. Prick the side of your finger (not the tip) with the lancet. Do not use the same finger consecutively. 3. Gently rub the finger until a small drop of blood appears. 4. Follow instructions that come with your meter for inserting the test strip, applying blood to the strip, and using your blood glucose meter. 5. Write down your result and any notes in your log.   Using alternative sites Some meters allow you to use areas of your body other than your finger (alternative sites) to test your blood. The most common alternative sites are the forearm, the thigh, and the palm of your hand. Alternative sites may not be as accurate as the fingers because blood flow is slower in those areas. This means that the result you get may be  delayed, and it may be different from the result that you would get from your finger. Use the finger only, and do not use alternative sites, if:  You think you have hypoglycemia.  You sometimes do not know that your blood glucose is getting low (hypoglycemia unawareness). General tips and recommendations Blood glucose log  Every time you check your blood glucose, write down your result. Also write down any notes about things that may be affecting your blood glucose, such as your diet and exercise for the day. This information can help you and your health care provider: ? Look for patterns in your blood glucose over time. ? Adjust your diabetes management plan as needed.  Check if your meter allows you to download your records to a computer or if there is an app for the meter. Most glucose meters store a record of glucose readings in the meter.   If you have type 1 diabetes:  Check your blood glucose 4 or more times a day if you are on intensive  insulin therapy with multiple daily injections (MDI) or if you are using an insulin pump. Check your blood glucose: ? Before every meal and snack. ? Before bedtime.  Also check your blood glucose: ? If you have symptoms of hypoglycemia. ? After treating low blood glucose. ? Before doing activities that create a risk for injury, like driving or using machinery. ? Before and after exercise. ? Two hours after a meal. ? Occasionally between 2:00 a.m. and 3:00 a.m., as directed.  You may need to check your blood glucose more often, 6-10 times per day, if: ? You have diabetes that is not well controlled. ? You are ill. ? You have a history of severe hypoglycemia. ? You have hypoglycemia unawareness. If you have type 2 diabetes:  Check your blood glucose 2 or more times a day if you take insulin or other diabetes medicines.  Check your blood glucose 4 or more times a day if you are on intensive insulin therapy. Occasionally, you may also need to check your glucose between 2:00 a.m. and 3:00 a.m., as directed.  Also check your blood glucose: ? Before and after exercise. ? Before doing activities that create a risk for injury, like driving or using machinery.  You may need to check your blood glucose more often if: ? Your medicine is being adjusted. ? Your diabetes is not well controlled. ? You are ill. General tips  Make sure you always have your supplies with you.  After you use a few boxes of test strips, adjust (calibrate) your blood glucose meter by following instructions that came with your meter.  If you have questions or need help, all blood glucose meters have a 24-hour hotline phone number available that you can call. Also contact your health care provider with questions or concerns you may have. Where to find more information  The American Diabetes Association: www.diabetes.org  The Association of Diabetes Care & Education Specialists: www.diabeteseducator.org Contact a  health care provider if:  Your blood glucose is at or above 240 mg/dL (13.3 mmol/L) for 2 days in a row.  You have been sick or have had a fever for 2 days or longer, and you are not getting better.  You have any of the following problems for more than 6 hours: ? You cannot eat or drink. ? You have nausea or vomiting. ? You have diarrhea. Get help right away if:  Your blood glucose is lower than 54 mg/dL (  3 mmol/L).  You become confused, or you have trouble thinking clearly.  You have difficulty breathing.  You have moderate or large ketone levels in your urine. These symptoms may represent a serious problem that is an emergency. Do not wait to see if the symptoms will go away. Get medical help right away. Call your local emergency services (911 in the [place] Do not drive yourself to the hospital. Summary  Monitoring your blood glucose is an important part of managing your diabetes.  Blood glucose monitoring involves checking your blood glucose as often as directed and keeping a log or record of your results over time.  Your health care provider will set individualized treatment goals for you. Your goals will be based on your age, other medical conditions you have, and how you respond to diabetes treatment.  Every time you check your blood glucose, write down your result. Also, write down any notes about things that may be affecting your blood glucose, such as your diet and exercise for the day. This information is not intended to replace advice given to you by your health care provider. Make sure you discuss any questions you have with your health care provider. Document Revised: 09/10/2020 Document Reviewed: 09/10/2020 Elsevier Patient Education  2021 [employer].

## 2021-02-06 NOTE — Progress Notes (Deleted)
Name: Alan Holt   MRN: [id number]    DOB: [birth year]   Date:02/06/2021       Progress Note  Subjective:    Chief Complaint  Chief Complaint  Patient presents with  . abnormal labs  . Diabetes    New onset    . testosterone    Low     I connected with  Alan Holt on 02/06/21 at  1:00 PM EST by telephone and verified that I am speaking with the correct person using two identifiers.   I discussed the limitations, risks, security and privacy concerns of performing an evaluation and management service by telephone and the availability of in person appointments. Staff also discussed with the patient that there may be a patient responsible charge related to this service.  Patient verbalized understanding and agreed to proceed with encounter. Patient Location: *** Provider Location: *** Additional Individuals present: ***  HPI         Patient Active Problem List   Diagnosis Date Noted  . Sleep apnea 06/01/2018  . Prediabetes 05/10/2018  . Increased prostate specific antigen (PSA) velocity 05/08/2018  . Obesity (BMI 30.0-34.9) 05/08/2018  . Hyperlipidemia with low HDL 08/10/2016  . Low testosterone 07/20/2016  . Adult hypothyroidism 07/09/2015    Social History   Tobacco Use  . Smoking status: Never Smoker  . Smokeless tobacco: Never Used  Substance Use Topics  . Alcohol use: No    Alcohol/week: 0.0 standard drinks     Current Outpatient Medications:  .  aspirin 81 MG tablet, Take 81 mg by mouth daily., Disp: , Rfl:  .  cholecalciferol (VITAMIN D) 1000 units tablet, Take 1,000 Units by mouth daily., Disp: , Rfl:  .  ibuprofen (ADVIL,MOTRIN) 600 MG tablet, Take 600 mg by mouth every 8 (eight) hours as needed. Pt takes rarely, 1-2 times per week, Disp: , Rfl:  .  levothyroxine (SYNTHROID, LEVOTHROID) 150 MCG tablet, Take 1 tablet by mouth every other day., Disp: , Rfl:  .  niacin (NIASPAN) 500 MG CR tablet, Take 3 tablets (1,500 mg total) by mouth at  bedtime., Disp: 270 tablet, Rfl: 3 .  rosuvastatin (CRESTOR) 5 MG tablet, Take 1 tablet (5 mg total) by mouth at bedtime., Disp: 90 tablet, Rfl: 3 .  calcium citrate (CALCITRATE - DOSED IN MG ELEMENTAL CALCIUM) 950 MG tablet, Take 1,200 mg of elemental calcium by mouth daily., Disp: , Rfl:   Allergies  Allergen Reactions  . Erythromycin Rash    Chart Review: ***  Review of Systems   Objective:    Virtual encounter, vitals limited, only able to obtain the following Today's Vitals   02/06/21 1313  Weight: 230 lb (104.3 kg)  Height: 6\' 2"  (1.88 m)   Body mass index is 29.53 kg/m. Nursing Note and Vital Signs reviewed.  Physical Exam  PE limited by telephone encounter  No results found for this or any previous visit (from the past 72 hour(s)).  Assessment and Plan:   No diagnosis found.  -Red flags and when to present for emergency care or RTC including but not limited to new/worsening/un-resolving symptoms, *** reviewed with patient at time of visit. Follow up and care instructions discussed and provided in AVS. - I discussed the assessment and treatment plan with the patient. The patient was provided an opportunity to ask questions and all were answered. The patient agreed with the plan and demonstrated an understanding of the instructions.  - The patient was advised to  call back or seek an in-person evaluation if the symptoms worsen or if the condition fails to improve as anticipated.  I provided *** minutes of non-face-to-face time during this encounter.  Delsa Grana, PA-C 02/06/21 1:31 PM

## 2021-02-11 ENCOUNTER — Ambulatory Visit: Payer: Medicare PPO

## 2021-02-12 ENCOUNTER — Ambulatory Visit (INDEPENDENT_AMBULATORY_CARE_PROVIDER_SITE_OTHER): Payer: Medicare PPO

## 2021-02-12 VITALS — Ht 74.0 in | Wt 231.0 lb

## 2021-02-12 DIAGNOSIS — E669 Obesity, unspecified: Secondary | ICD-10-CM

## 2021-02-12 DIAGNOSIS — Z0000 Encounter for general adult medical examination without abnormal findings: Secondary | ICD-10-CM | POA: Diagnosis not present

## 2021-02-12 DIAGNOSIS — E119 Type 2 diabetes mellitus without complications: Secondary | ICD-10-CM

## 2021-02-12 DIAGNOSIS — E786 Lipoprotein deficiency: Secondary | ICD-10-CM

## 2021-02-12 DIAGNOSIS — E785 Hyperlipidemia, unspecified: Secondary | ICD-10-CM | POA: Diagnosis not present

## 2021-02-12 DIAGNOSIS — E039 Hypothyroidism, unspecified: Secondary | ICD-10-CM

## 2021-02-12 LAB — TESTOSTERONE: Testosterone: 112 ng/dL — ABNORMAL LOW (ref 250–827)

## 2021-02-12 NOTE — Patient Instructions (Signed)
Alan Holt , Thank you for taking time to come for your Medicare Wellness Visit. I appreciate your ongoing commitment to your health goals. Please review the following plan we discussed and let me know if I can assist you in the future.   Screening recommendations/referrals: Colonoscopy: done 08/08/18. Repeat in 2024 Recommended yearly ophthalmology/optometry visit for glaucoma screening and checkup Recommended yearly dental visit for hygiene and checkup  Vaccinations: Influenza vaccine: done 09/26/20 Pneumococcal vaccine: done 11/09/18 Tdap vaccine: done 11/08/14 Shingles vaccine: Shingrix discussed. Please contact your pharmacy for coverage information.  Covid-19:  Done 01/21/20, 02/12/20 & 10/13/20  Advanced directives: Please bring a copy of your health care power of attorney and living will to the office at your convenience.  Conditions/risks identified:   Please visit the following web sites for diabetes education information:  Www.diabetes.org  [email or web address]  Next appointment: Follow up in one year for your annual wellness visit.   Preventive Care 71 Years and Older, Male Preventive care refers to lifestyle choices and visits with your health care provider that can promote health and wellness. What does preventive care include?  A yearly physical exam. This is also called an annual well check.  Dental exams once or twice a year.  Routine eye exams. Ask your health care provider how often you should have your eyes checked.  Personal lifestyle choices, including:  Daily care of your teeth and gums.  Regular physical activity.  Eating a healthy diet.  Avoiding tobacco and drug use.  Limiting alcohol use.  Practicing safe sex.  Taking low doses of aspirin every day.  Taking vitamin and mineral supplements as recommended by your health care provider. What happens during an annual well check? The services and screenings done by your health care provider  during your annual well check will depend on your age, overall health, lifestyle risk factors, and family history of disease. Counseling  Your health care provider may ask you questions about your:  Alcohol use.  Tobacco use.  Drug use.  Emotional well-being.  Home and relationship well-being.  Sexual activity.  Eating habits.  History of falls.  Memory and ability to understand (cognition).  Work and work Statistician. Screening  You may have the following tests or measurements:  Height, weight, and BMI.  Blood pressure.  Lipid and cholesterol levels. These may be checked every 5 years, or more frequently if you are over 16 years old.  Skin check.  Lung cancer screening. You may have this screening every year starting at age 44 if you have a 30-pack-year history of smoking and currently smoke or have quit within the past 15 years.  Fecal occult blood test (FOBT) of the stool. You may have this test every year starting at age 84.  Flexible sigmoidoscopy or colonoscopy. You may have a sigmoidoscopy every 5 years or a colonoscopy every 10 years starting at age 76.  Prostate cancer screening. Recommendations will vary depending on your family history and other risks.  Hepatitis C blood test.  Hepatitis B blood test.  Sexually transmitted disease (STD) testing.  Diabetes screening. This is done by checking your blood sugar (glucose) after you have not eaten for a while (fasting). You may have this done every 1-3 years.  Abdominal aortic aneurysm (AAA) screening. You may need this if you are a current or former smoker.  Osteoporosis. You may be screened starting at age 47 if you are at high risk. Talk with your health care provider about your test results, treatment  options, and if necessary, the need for more tests. Vaccines  Your health care provider may recommend certain vaccines, such as:  Influenza vaccine. This is recommended every year.  Tetanus,  diphtheria, and acellular pertussis (Tdap, Td) vaccine. You may need a Td booster every 10 years.  Zoster vaccine. You may need this after age 62.  Pneumococcal 13-valent conjugate (PCV13) vaccine. One dose is recommended after age 2.  Pneumococcal polysaccharide (PPSV23) vaccine. One dose is recommended after age 31. Talk to your health care provider about which screenings and vaccines you need and how often you need them. This information is not intended to replace advice given to you by your health care provider. Make sure you discuss any questions you have with your health care provider. Document Released: 01/09/2016 Document Revised: 09/01/2016 Document Reviewed: 10/14/2015 Elsevier Interactive Patient Education  2017 [place] Prevention in the Home Falls can cause injuries. They can happen to people of all ages. There are many things you can do to make your home safe and to help prevent falls. What can I do on the outside of my home?  Regularly fix the edges of walkways and driveways and fix any cracks.  Remove anything that might make you trip as you walk through a door, such as a raised step or threshold.  Trim any bushes or trees on the path to your home.  Use bright outdoor lighting.  Clear any walking paths of anything that might make someone trip, such as rocks or tools.  Regularly check to see if handrails are loose or broken. Make sure that both sides of any steps have handrails.  Any raised decks and porches should have guardrails on the edges.  Have any leaves, snow, or ice cleared regularly.  Use sand or salt on walking paths during winter.  Clean up any spills in your garage right away. This includes oil or grease spills. What can I do in the bathroom?  Use night lights.  Install grab bars by the toilet and in the tub and shower. Do not use towel bars as grab bars.  Use non-skid mats or decals in the tub or shower.  If you need to sit down in  the shower, use a plastic, non-slip stool.  Keep the floor dry. Clean up any water that spills on the floor as soon as it happens.  Remove soap buildup in the tub or shower regularly.  Attach bath mats securely with double-sided non-slip rug tape.  Do not have throw rugs and other things on the floor that can make you trip. What can I do in the bedroom?  Use night lights.  Make sure that you have a light by your bed that is easy to reach.  Do not use any sheets or blankets that are too big for your bed. They should not hang down onto the floor.  Have a firm chair that has side arms. You can use this for support while you get dressed.  Do not have throw rugs and other things on the floor that can make you trip. What can I do in the kitchen?  Clean up any spills right away.  Avoid walking on wet floors.  Keep items that you use a lot in easy-to-reach places.  If you need to reach something above you, use a strong step stool that has a grab bar.  Keep electrical cords out of the way.  Do not use floor polish or wax that makes floors slippery.  If you must use wax, use non-skid floor wax.  Do not have throw rugs and other things on the floor that can make you trip. What can I do with my stairs?  Do not leave any items on the stairs.  Make sure that there are handrails on both sides of the stairs and use them. Fix handrails that are broken or loose. Make sure that handrails are as long as the stairways.  Check any carpeting to make sure that it is firmly attached to the stairs. Fix any carpet that is loose or worn.  Avoid having throw rugs at the top or bottom of the stairs. If you do have throw rugs, attach them to the floor with carpet tape.  Make sure that you have a light switch at the top of the stairs and the bottom of the stairs. If you do not have them, ask someone to add them for you. What else can I do to help prevent falls?  Wear shoes that:  Do not have high  heels.  Have rubber bottoms.  Are comfortable and fit you well.  Are closed at the toe. Do not wear sandals.  If you use a stepladder:  Make sure that it is fully opened. Do not climb a closed stepladder.  Make sure that both sides of the stepladder are locked into place.  Ask someone to hold it for you, if possible.  Clearly mark and make sure that you can see:  Any grab bars or handrails.  First and last steps.  Where the edge of each step is.  Use tools that help you move around (mobility aids) if they are needed. These include:  Canes.  Walkers.  Scooters.  Crutches.  Turn on the lights when you go into a dark area. Replace any light bulbs as soon as they burn out.  Set up your furniture so you have a clear path. Avoid moving your furniture around.  If any of your floors are uneven, fix them.  If there are any pets around you, be aware of where they are.  Review your medicines with your doctor. Some medicines can make you feel dizzy. This can increase your chance of falling. Ask your doctor what other things that you can do to help prevent falls. This information is not intended to replace advice given to you by your health care provider. Make sure you discuss any questions you have with your health care provider. Document Released: 10/09/2009 Document Revised: 05/20/2016 Document Reviewed: 01/17/2015 Elsevier Interactive Patient Education  2017 [employer].

## 2021-02-12 NOTE — Progress Notes (Signed)
Subjective:   Alan Holt is a 70 y.o. male who presents for Medicare Annual/Subsequent preventive examination.  Virtual Visit via Video Note  I connected with  Alan Holt on 02/12/21 at 10:00 AM EST via telehealth video enabled device and verified that I am speaking with the correct person using two identifiers.  Location: Patient: home Provider: [place] Persons participating in the virtual visit: [name]   I discussed the limitations, risks, security and privacy concerns of performing an evaluation and management service by telephone and the availability of in person appointments. The patient expressed understanding and agreed to proceed.  Some vital signs may be absent or patient reported.   Clemetine Marker, LPN    Review of Systems     Cardiac Risk Factors include: advanced age (>27mn, >>55women);dyslipidemia;male gender;diabetes mellitus     Objective:    Today's Vitals   02/12/21 1010  Weight: 231 lb (104.8 kg)  Height: '6\' 2"'  (1.88 m)   Body mass index is 29.66 kg/m.  Advanced Directives 02/12/2021 02/12/2020 02/06/2019 10/26/2017 05/03/2017 02/01/2017 11/10/2016  Does Patient Have a Medical Advance Directive? Yes Yes No Yes No No No  Type of AParamedicof [place] will [place] will - HGlen EllenLiving will - - -  Does patient want to make changes to medical advance directive? - - - - - - -  Copy of HUticain Chart? No - copy requested No - copy requested - No - copy requested - - -  Would patient like information on creating a medical advance directive? - - Yes (MAU/Ambulatory/Procedural Areas - Information given) - - - No - patient declined information    Current Medications (verified) Outpatient Encounter Medications as of 02/12/2021  Medication Sig  . aspirin 81 MG tablet Take 81 mg by mouth daily.  . Blood Glucose Monitoring Suppl (ACCU-CHEK  GUIDE) w/Device KIT See admin instructions.  . calcium citrate (CALCITRATE - DOSED IN MG ELEMENTAL CALCIUM) 950 MG tablet Take 1,200 mg of elemental calcium by mouth daily.  . cholecalciferol (VITAMIN D) 1000 units tablet Take 1,000 Units by mouth daily.  . Glucose Blood (BLOOD GLUCOSE TEST STRIPS) STRP Use as directed to monitor FSBS once daily prn. Dx: E11.65  . ibuprofen (ADVIL,MOTRIN) 600 MG tablet Take 600 mg by mouth every 8 (eight) hours as needed. Pt takes rarely, 1-2 times per week  . Lancets MISC Use as directed to monitor FSBS once daily prn. Dx: E11.65  . levothyroxine (SYNTHROID, LEVOTHROID) 150 MCG tablet Take 1 tablet by mouth daily.  . niacin (NIASPAN) 500 MG CR tablet Take 3 tablets (1,500 mg total) by mouth at bedtime.  . rosuvastatin (CRESTOR) 5 MG tablet Take 1 tablet (5 mg total) by mouth at bedtime.   No facility-administered encounter medications on file as of 02/12/2021.    Allergies (verified) Erythromycin   History: Past Medical History:  Diagnosis Date  . Allergy   . Diabetes mellitus without complication ([place]   . Elevated BP without diagnosis of hypertension 11/10/2016  . Hypertension   . Prediabetes 05/10/2018  . Sleep apnea   . Thyroid disease    Past Surgical History:  Procedure Laterality Date  . HERNIA REPAIR     childhood  . KNEE SURGERY Left 1993  . MOUTH SURGERY  04/05/2018   bone graft, 3 teeth pulled  . THYROID SURGERY  2015  . TOTAL THYROIDECTOMY Bilateral 07/15/2014   Dr.  Juengel.    Family History  Problem Relation Age of Onset  . Diabetes Mother   . Alcohol abuse Father   . Stroke Father   . Diabetes Brother   . Heart attack Brother    Social History   Socioeconomic History  . Marital status: Married    Spouse name: Not on file  . Number of children: Not on file  . Years of education: Not on file  . Highest education level: Master's degree (e.g., MA, MS, MEng, MEd, MSW, MBA)  Occupational History    Comment: retired PE  teacher  Tobacco Use  . Smoking status: Never Smoker  . Smokeless tobacco: Never Used  Vaping Use  . Vaping Use: Never used  Substance and Sexual Activity  . Alcohol use: No    Alcohol/week: 0.0 standard drinks  . Drug use: No  . Sexual activity: Not Currently  Other Topics Concern  . Not on file  Social History Narrative  . Not on file   Social Determinants of Health   Financial Resource Strain: Low Risk   . Difficulty of Paying Living Expenses: Not hard at all  Food Insecurity: No Food Insecurity  . Worried About Charity fundraiser in the Last Year: Never true  . Ran Out of Food in the Last Year: Never true  Transportation Needs: No Transportation Needs  . Lack of Transportation (Medical): No  . Lack of Transportation (Non-Medical): No  Physical Activity: Sufficiently Active  . Days of Exercise per Week: 5 days  . Minutes of Exercise per Session: 60 min  Stress: No Stress Concern Present  . Feeling of Stress : Only a little  Social Connections: Unknown  . Frequency of Communication with Friends and Family: More than three times a week  . Frequency of Social Gatherings with Friends and Family: Three times a week  . Attends Religious Services: Not on file  . Active Member of Clubs or Organizations: No  . Attends Archivist Meetings: Never  . Marital Status: Married    Tobacco Counseling Counseling given: Not Answered   Clinical Intake:  Pre-visit preparation completed: Yes  Pain : No/denies pain     Nutritional Risks: None Diabetes: Yes CBG done?: No Did pt. bring in CBG monitor from home?: No  How often do you need to have someone help you when you read instructions, pamphlets, or other written materials from your doctor or pharmacy?: 1 - Never  Nutrition Risk Assessment:  Has the patient had any N/V/D within the last 2 months?  No  Does the patient have any non-healing wounds?  No  Has the patient had any unintentional weight loss or weight  gain?  No   Diabetes:  Is the patient diabetic?  Yes  If diabetic, was a CBG obtained today?  No  Did the patient bring in their glucometer from home?  No  How often do you monitor your CBG's? 1-2 times per week .   Financial Strains and Diabetes Management:  Are you having any financial strains with the device, your supplies or your medication? No .  Does the patient want to be seen by Chronic Care Management for management of their diabetes?  Yes  - enrolled; scheduled for 02/20/21 Would the patient like to be referred to a Nutritionist or for Diabetic Management?  Yes   Diabetic Exams:  Diabetic Eye Exam: Completed 08/2020.   Diabetic Foot Exam: Newly diagnosed; pt to completed at next visit.   Interpreter  Needed?: No  Information entered by :: Clemetine Marker LPN   Activities of Daily Living In your present state of health, do you have any difficulty performing the following activities: 02/12/2021 02/06/2021  Hearing? N N  Comment declines hearing aids -  Vision? N N  Difficulty concentrating or making decisions? N N  Walking or climbing stairs? N N  Dressing or bathing? N N  Doing errands, shopping? N N  Preparing Food and eating ? N -  Using the Toilet? N -  In the past six months, have you accidently leaked urine? N -  Do you have problems with loss of bowel control? N -  Managing your Medications? N -  Managing your Finances? N -  Housekeeping or managing your Housekeeping? N -  Some recent data might be hidden    Patient Care Team: Delsa Grana, PA-C as PCP - General (Family Medicine) Margaretha Sheffield, MD as Consulting Physician (Otolaryngology) Neldon Labella, RN as Registered Nurse  Indicate any recent Medical Services you may have received from other than [facility] providers in the past year (date may be approximate).     Assessment:   This is a routine wellness examination for [place].  Hearing/Vision screen  Hearing Screening   '125Hz'  '250Hz'  '500Hz'  '1000Hz'  '2000Hz'   '3000Hz'  '4000Hz'  '6000Hz'  '8000Hz'   Right ear:           Left ear:           Comments: Pt denies hearing difficulty  Vision Screening Comments: Annual vision screenings at [facility]  Dietary issues and exercise activities discussed: Current Exercise Habits: Home exercise routine, Type of exercise: walking;strength training/weights, Time (Minutes): 60, Frequency (Times/Week): 5, Weekly Exercise (Minutes/Week): 300, Intensity: Moderate, Exercise limited by: None identified  Goals    . Exercise 150 minutes per week (moderate activity)     Continue to remain active by jogging, or doing other strengthening exercises for a minimum of 150 minutes per week.      Depression Screen PHQ 2/9 Scores 02/12/2021 02/06/2021 01/14/2021 06/16/2020 02/12/2020 11/13/2019 05/10/2019  PHQ - 2 Score 0 0 0 0 0 0 0  PHQ- 9 Score - 0 - 0 - 0 0    Fall Risk Fall Risk  02/12/2021 02/06/2021 01/14/2021 06/16/2020 02/12/2020  Falls in the past year? 0 0 0 0 0  Number falls in past yr: 0 0 0 0 0  Injury with Fall? 0 0 0 0 0  Risk for fall due to : No Fall Risks - - - No Fall Risks  Follow up Falls prevention discussed - Falls evaluation completed - Falls prevention discussed    FALL RISK PREVENTION PERTAINING TO THE HOME:  Any stairs in or around the home? No  If so, are there any without handrails? No  Home free of loose throw rugs in walkways, pet beds, electrical cords, etc? Yes  Adequate lighting in your home to reduce risk of falls? Yes   ASSISTIVE DEVICES UTILIZED TO PREVENT FALLS:  Life alert? No  Use of a cane, walker or w/c? No  Grab bars in the bathroom? No  Shower chair or bench in shower? No  Elevated toilet seat or a handicapped toilet? No   TIMED UP AND GO:  Was the test performed? No . Telephonic visit.   Cognitive Function: Normal cognitive status assessed by direct observation by this Nurse Health Advisor. No abnormalities found.       6CIT Screen 02/06/2019  What Year? 0 points  What  month? 0 points  What time? 0 points  Count back from 20 0 points  Months in reverse 0 points  Repeat phrase 0 points  Total Score 0    Immunizations Immunization History  Administered Date(s) Administered  . Fluad Quad(high Dose 65+) 10/12/2019  . Influenza, High Dose Seasonal PF 10/23/2018  . Influenza-Unspecified 09/27/2015, 10/05/2016, 09/26/2020  . PFIZER(Purple Top)SARS-COV-2 Vaccination 01/21/2020, 02/12/2020, 10/13/2020  . Pneumococcal Conjugate-13 10/26/2017  . Pneumococcal Polysaccharide-23 11/09/2018  . Tdap 11/08/2014    TDAP status: Up to date  Flu Vaccine status: Up to date  Pneumococcal vaccine status: Up to date  Covid-19 vaccine status: Completed vaccines  Qualifies for Shingles Vaccine? Yes   Zostavax completed Yes  completed per patient Shingrix Completed?: No.    Education has been provided regarding the importance of this vaccine. Patient has been advised to call insurance company to determine out of pocket expense if they have not yet received this vaccine. Advised may also receive vaccine at local pharmacy or Health Dept. Verbalized acceptance and understanding.  Screening Tests Health Maintenance  Topic Date Due  . FOOT EXAM  Never done  . URINE MICROALBUMIN  Never done  . HEMOGLOBIN A1C  07/20/2021  . OPHTHALMOLOGY EXAM  08/27/2021  . COLONOSCOPY (Pts 45-64yr Insurance coverage will need to be confirmed)  08/09/2023  . TETANUS/TDAP  11/08/2024  . INFLUENZA VACCINE  Completed  . COVID-19 Vaccine  Completed  . Hepatitis C Screening  Completed  . PNA vac Low Risk Adult  Completed    Health Maintenance  Health Maintenance Due  Topic Date Due  . FOOT EXAM  Never done  . URINE MICROALBUMIN  Never done    Colorectal cancer screening: Type of screening: Colonoscopy. Completed 08/08/18. Repeat every 5 years  Lung Cancer Screening: (Low Dose CT Chest recommended if Age 71-80years, 30 pack-year currently smoking OR have quit w/in 15years.) does  not qualify.   Additional Screening:  Hepatitis C Screening: does qualify; Completed 02/01/18  Vision Screening: Recommended annual ophthalmology exams for early detection of glaucoma and other disorders of the eye. Is the patient up to date with their annual eye exam?  Yes  Who is the provider or what is the name of the office in which the patient attends annual eye exams? ASprayScreening: Recommended annual dental exams for proper oral hygiene  Community Resource Referral / Chronic Care Management: CRR required this visit?  No   CCM required this visit?  No      Plan:     I have personally reviewed and noted the following in the patient's chart:   . Medical and social history . Use of alcohol, tobacco or illicit drugs  . Current medications and supplements . Functional ability and status . Nutritional status . Physical activity . Advanced directives . List of other physicians . Hospitalizations, surgeries, and ER visits in previous 12 months . Vitals . Screenings to include cognitive, depression, and falls . Referrals and appointments  In addition, I have reviewed and discussed with patient certain preventive protocols, quality metrics, and best practice recommendations. A written personalized care plan for preventive services as well as general preventive health recommendations were provided to patient.     KClemetine Marker LPN   29/79/[id number]  Nurse Notes: pt doing well. [facility] and [facility].

## 2021-02-20 ENCOUNTER — Other Ambulatory Visit: Payer: Self-pay

## 2021-02-20 ENCOUNTER — Ambulatory Visit (INDEPENDENT_AMBULATORY_CARE_PROVIDER_SITE_OTHER): Payer: Medicare PPO

## 2021-02-20 DIAGNOSIS — E119 Type 2 diabetes mellitus without complications: Secondary | ICD-10-CM | POA: Diagnosis not present

## 2021-02-20 DIAGNOSIS — E786 Lipoprotein deficiency: Secondary | ICD-10-CM

## 2021-02-20 DIAGNOSIS — E785 Hyperlipidemia, unspecified: Secondary | ICD-10-CM

## 2021-02-23 NOTE — Patient Instructions (Signed)
Thank you for allowing the Chronic Care Management team to participate in your care. It was a pleasure meeting with you today.     Patient Care Plan: Diabetes Type 2 (Adult)    Problem Identified: Glycemic Management (Diabetes, Type 2)     Long-Range Goal: Glycemic Management Optimized   Start Date: 02/20/2021  Expected End Date: 06/20/2021  Priority: High  Note:   Objective:  Lab Results  Component Value Date   HGBA1C 7.3 (H) 01/20/2021 .   Lab Results  Component Value Date   CREATININE 0.98 01/20/2021   CREATININE 0.94 06/16/2020   CREATININE 1.00 11/15/2019 .   Marland Kitchen No results found for: EGFR  Current Barriers:  . Chronic disease management and support related to Diabetes self-management  Case Manager Clinical Goal(s):  Over the next 120 days, patient will: Marland Kitchen Demonstrate improved adherence to prescribed treatment plan for Diabetes self management as evidenced by monitoring and recording of CBG, and adherence to diabetic/ carb modified diet.  Interventions:  . Collaboration with Alan Grana, PA-C regarding development and update of comprehensive plan of care as evidenced by provider attestation and co-signature . Inter-disciplinary care team collaboration (see longitudinal plan of care) . Discussed current plan for diabetes management. Currently attempting to control with diet and exercise. We discussed dietary options that will assist with maintaining an optimal glycemic index. He exercises regularly and is attempting to adhere to a heart healthy/diabetic diet.  . Reviewed his Accu-Chek Guide for fasting blood glucose readings over the past weeks. Readings in the 150's. Encouraged to monitor and maintain a log. . Reviewed s/sx of hypoglycemia and hyperglycemia along with recommended interventions. . Discussed and offered referrals for available Diabetes education classes. He is scheduled for outreach with a Diabetic Nutritionist within the next week. . Discussed importance of  completing recommended DM preventive care.  Patient Goals/Self-Care Activities Over the next 120 days, patient will:  -Attend all scheduled provider appointments -Monitor blood glucose levels consistently and utilize recommended interventions -Adhere to prescribed heart healthy/carb modified diet -Continue exercising and engaging in low impact activities as tolerated -Complete outreach with the Diabetic Nutritionist -Notify provider or care management team with questions or new concerns  Follow Up Plan:  -Will follow up in two months.               Alan Holt was given information about Chronic Care Management services  including:  1. CCM service includes personalized support from designated clinical staff supervised by his physician, including individualized plan of care and coordination with other care providers 2. 24/7 contact phone numbers for assistance for urgent and routine care needs. 3. Service will only be billed when office clinical staff spend 20 minutes or more in a month to coordinate care. 4. Only one practitioner may furnish and bill the service in a calendar month. 5. The patient may stop CCM services at any time (effective at the end of the month) by phone call to the office staff. 6. The patient will be responsible for cost sharing (co-pay) of up to 20% of the service fee (after annual deductible is met).  Patient agreed to services and verbal consent obtained.      Alan Holt verbalized understanding of the information discussed during the clinic visit today. Educational resources and instructions were provided at the visit today.A member of the care management team will follow up with Alan Holt in two months.    Alan Holt [facility] Care Management [facility] [phone]

## 2021-02-23 NOTE — Chronic Care Management (AMB) (Signed)
Chronic Care Management   Initial Visit Note   Name: Alan Holt MRN: [id number] DOB: September 30, 1951  Primary Care Provider: Delsa Grana, PA-C Reason for referral : Chronic Care Management   Alan Holt is a 70 y.o. year old male who is a primary care patient of Alan Holt, [place]. The CCM team was consulted for assistance with chronic disease management and care coordination.  Review of Alan Holt status, including review of consultants reports, relevant labs and test results was conducted today. Collaboration with appropriate care team members was performed as part of the comprehensive evaluation and provision of chronic care management services.    SDOH (Social Determinants of Health) assessments performed: Yes See Care Plan activities for detailed interventions related to SDOH  SDOH Interventions   Flowsheet Row Most Recent Value  SDOH Interventions   Food Insecurity Interventions Intervention Not Indicated  Financial Strain Interventions Intervention Not Indicated  Physical Activity Interventions Intervention Not Indicated  Transportation Interventions Intervention Not Indicated       Medications: Outpatient Encounter Medications as of 02/20/2021  Medication Sig  . aspirin 81 MG tablet Take 81 mg by mouth daily.  . Blood Glucose Monitoring Suppl (ACCU-CHEK GUIDE) w/Device KIT See admin instructions.  . calcium citrate (CALCITRATE - DOSED IN MG ELEMENTAL CALCIUM) 950 MG tablet Take 1,200 mg of elemental calcium by mouth daily.  . cholecalciferol (VITAMIN D) 1000 units tablet Take 1,000 Units by mouth daily.  . Glucose Blood (BLOOD GLUCOSE TEST STRIPS) STRP Use as directed to monitor FSBS once daily prn. Dx: E11.65  . ibuprofen (ADVIL,MOTRIN) 600 MG tablet Take 600 mg by mouth every 8 (eight) hours as needed. Pt takes rarely, 1-2 times per week  . Lancets MISC Use as directed to monitor FSBS once daily prn. Dx: E11.65  . levothyroxine (SYNTHROID, LEVOTHROID) 150 MCG tablet  Take 1 tablet by mouth daily.  . niacin (NIASPAN) 500 MG CR tablet Take 3 tablets (1,500 mg total) by mouth at bedtime.  . rosuvastatin (CRESTOR) 5 MG tablet Take 1 tablet (5 mg total) by mouth at bedtime.   No facility-administered encounter medications on file as of 02/20/2021.     Objective:  Patient Care Plan: Diabetes Type 2 (Adult)    Problem Identified: Glycemic Management (Diabetes, Type 2)     Long-Range Goal: Glycemic Management Optimized   Start Date: 02/20/2021  Expected End Date: 06/20/2021  Priority: High  Note:   Objective:  Lab Results  Component Value Date   HGBA1C 7.3 (H) 01/20/2021 .   Lab Results  Component Value Date   CREATININE 0.98 01/20/2021   CREATININE 0.94 06/16/2020   CREATININE 1.00 11/15/2019 .   Marland Kitchen No results found for: EGFR  Current Barriers:  . Chronic disease management and support related to Diabetes self-management  Case Manager Clinical Goal(s):  Over the next 120 days, patient will: Marland Kitchen Demonstrate improved adherence to prescribed treatment plan for Diabetes self management as evidenced by monitoring and recording of CBG, and adherence to diabetic/ carb modified diet.  Interventions:  . Collaboration with Alan Grana, PA-C regarding development and update of comprehensive plan of care as evidenced by provider attestation and co-signature . Inter-disciplinary care team collaboration (see longitudinal plan of care) . Discussed current plan for diabetes management. Currently attempting to control with diet and exercise. We discussed dietary options that will assist with maintaining an optimal glycemic index. He exercises regularly and is attempting to adhere to a heart healthy/diabetic diet.  . Reviewed his Accu-Chek  Guide for fasting blood glucose readings over the past weeks. Readings in the 150's. Encouraged to monitor and maintain a log. . Reviewed s/sx of hypoglycemia and hyperglycemia along with recommended interventions. . Discussed and  offered referrals for available Diabetes education classes. He is scheduled for outreach with a Diabetic Nutritionist within the next week. . Discussed importance of completing recommended DM preventive care.  Patient Goals/Self-Care Activities Over the next 120 days, patient will:  -Attend all scheduled provider appointments -Monitor blood glucose levels consistently and utilize recommended interventions -Adhere to prescribed heart healthy/carb modified diet -Continue exercising and engaging in low impact activities as tolerated -Complete outreach with the Diabetic Nutritionist -Notify provider or care management team with questions or new concerns  Follow Up Plan:  -Will follow up in two months.     Alan Holt was given information about Chronic Care Management services  including:  1. CCM service includes personalized support from designated clinical staff supervised by his physician, including individualized plan of care and coordination with other care providers 2. 24/7 contact phone numbers for assistance for urgent and routine care needs. 3. Service will only be billed when office clinical staff spend 20 minutes or more in a month to coordinate care. 4. Only one practitioner may furnish and bill the service in a calendar month. 5. The patient may stop CCM services at any time (effective at the end of the month) by phone call to the office staff. 6. The patient will be responsible for cost sharing (co-pay) of up to 20% of the service fee (after annual deductible is met).  Patient agreed to services and verbal consent obtained.     PLAN:  A member of the care management team will follow up with Alan Holt in two months.    Cristy Friedlander [facility] Care Management [facility] [phone]

## 2021-02-27 ENCOUNTER — Encounter: Payer: Self-pay | Admitting: *Deleted

## 2021-02-27 ENCOUNTER — Other Ambulatory Visit: Payer: Self-pay

## 2021-02-27 ENCOUNTER — Encounter: Payer: Medicare PPO | Attending: Family Medicine | Admitting: *Deleted

## 2021-02-27 VITALS — BP 142/80 | Ht 74.0 in | Wt 237.4 lb

## 2021-02-27 DIAGNOSIS — E669 Obesity, unspecified: Secondary | ICD-10-CM | POA: Diagnosis not present

## 2021-02-27 DIAGNOSIS — E119 Type 2 diabetes mellitus without complications: Secondary | ICD-10-CM | POA: Insufficient documentation

## 2021-02-27 DIAGNOSIS — E039 Hypothyroidism, unspecified: Secondary | ICD-10-CM | POA: Insufficient documentation

## 2021-02-27 DIAGNOSIS — E785 Hyperlipidemia, unspecified: Secondary | ICD-10-CM | POA: Insufficient documentation

## 2021-02-27 DIAGNOSIS — Z6830 Body mass index (BMI) 30.0-30.9, adult: Secondary | ICD-10-CM | POA: Insufficient documentation

## 2021-02-27 NOTE — Progress Notes (Signed)
Diabetes Self-Management Education  Visit Type: First/Initial  Appt. Start Time: 0855 Appt. End Time: 1005  02/27/2021  [name], identified by name and date of birth, is a 70 y.o. male with a diagnosis of Diabetes: Type 2.   ASSESSMENT  Blood pressure (!) 142/80, height 6\' 2"  (1.88 m), weight 237 lb 6.4 oz (107.7 kg). Body mass index is 30.48 kg/m.   Diabetes Self-Management Education - 02/27/21 1059      Visit Information   Visit Type First/Initial      Initial Visit   Diabetes Type Type 2    Are you currently following a meal plan? Yes    What type of meal plan do you follow? "fewer carbs and diet drinks, more salads and protein"    Are you taking your medications as prescribed? Yes    Date Diagnosed 1 month ago      Health Coping   How would you rate your overall health? Good      Psychosocial Assessment   Patient Belief/Attitude about Diabetes Motivated to manage diabetes    Self-care barriers None    Self-management support Doctor's office;Family    Other persons present Spouse/SO    Patient Concerns Nutrition/Meal planning;Medication;Monitoring;Weight Control;Healthy Lifestyle    Special Needs None    Preferred Learning Style Auditory;Visual;Hands on    [place] in progress    How often do you need to have someone help you when you read instructions, pamphlets, or other written materials from your doctor or pharmacy? 1 - Never    What is the last grade level you completed in school? Masters      Pre-Education Assessment   Patient understands the diabetes disease and treatment process. Needs Instruction    Patient understands incorporating nutritional management into lifestyle. Needs Instruction    Patient undertands incorporating physical activity into lifestyle. Demonstrates understanding / competency    Patient understands using medications safely. Needs Instruction    Patient understands monitoring blood glucose, interpreting and using  results Needs Review    Patient understands prevention, detection, and treatment of acute complications. Needs Instruction    Patient understands prevention, detection, and treatment of chronic complications. Needs Instruction    Patient understands how to develop strategies to address psychosocial issues. Needs Instruction    Patient understands how to develop strategies to promote health/change behavior. Needs Instruction      Complications   Last HgB A1C per patient/outside source 7.3 %   01/20/2021   How often do you check your blood sugar? 3-4 times / week    Fasting Blood glucose range (mg/dL) 130-179   Pt reports FBG's 143-157 mg/dL   Have you had a dilated eye exam in the past 12 months? Yes    Have you had a dental exam in the past 12 months? Yes    Are you checking your feet? Yes    How many days per week are you checking your feet? 2      Dietary Intake   Breakfast eggs, bacon or sausage, cinnamon raisin toast; cereal, skim milk, granola and fruit (blueberries, strawberries);    Lunch ham, [place] or peanut butter sandwich; hamburger patty and salad; left overs    Snack (afternoon) nuts, peanut butter crackers, sliced apples    Dinner roast, beef, chicken, pork and fish 1 x week; brown rice, peas, beans, pasta, green beans, broccoli, asparagus, lettuce tomatoes peppers olives celery carrots with salad    Beverage(s) water, unsweetened tea, diet soda  1 x week, juice      Exercise   Exercise Type Strenuous (running);Moderate (swimming / aerobic walking)   jogging, walking 10-12 miles/week   How many days per week to you exercise? 5.5    How many minutes per day do you exercise? 40    Total minutes per week of exercise 220      Patient Education   Previous Diabetes Education No    Disease state  Definition of diabetes, type 1 and 2, and the diagnosis of diabetes;Factors that contribute to the development of diabetes    Nutrition management  Role of diet in the treatment of  diabetes and the relationship between the three main macronutrients and blood glucose level;Food label reading, portion sizes and measuring food.;Reviewed blood glucose goals for pre and post meals and how to evaluate the patients' food intake on their blood glucose level.    Physical activity and exercise  Role of exercise on diabetes management, blood pressure control and cardiac health.    Monitoring Purpose and frequency of SMBG.;Taught/discussed recording of test results and interpretation of SMBG.;Identified appropriate SMBG and/or A1C goals.    Chronic complications Relationship between chronic complications and blood glucose control    Psychosocial adjustment Identified and addressed patients feelings and concerns about diabetes      Individualized Goals (developed by patient)   Reducing Risk Other (comment)   improve blood sugars, decrease medications, prevent diabetes complications, lose weight, lead a healthier lifestyle, become more fit     Outcomes   Expected Outcomes Demonstrated interest in learning. Expect positive outcomes    Future DMSE 4-6 wks           Individualized Plan for Diabetes Self-Management Training:   Learning Objective:  Patient will have a greater understanding of diabetes self-management. Patient education plan is to attend individual and/or group sessions per assessed needs and concerns.   Plan:   Patient Instructions  Check blood sugars  before breakfast and 2 hrs after one meal  - 3 x week Bring blood sugar records to the next class  Exercise: Continue program for 20-60  minutes  5-6  days a week  Eat 3 meals day, 1-2  snacks a day Space meals 4-6 hours apart Avoid sugar sweetened drinks ( juices)  Expected Outcomes:  Demonstrated interest in learning. Expect positive outcomes  Education material provided:  General Meal Planning Guidelines Simple Meal Plan  If problems or questions, patient to contact team via:  Johny Drilling, RN, [place] [phone]  Future DSME appointment: 4-6 wks  March 26, 2021 for Diabetes Class 1

## 2021-02-27 NOTE — Patient Instructions (Signed)
Check blood sugars  before breakfast and 2 hrs after one meal  - 3 x week Bring blood sugar records to the next class  Exercise: Continue program for 20-60  minutes  5-6  days a week  Eat 3 meals day, 1-2  snacks a day Space meals 4-6 hours apart Avoid sugar sweetened drinks ( juices)  Return for classes on:

## 2021-03-11 ENCOUNTER — Encounter: Payer: Self-pay | Admitting: Family Medicine

## 2021-03-11 DIAGNOSIS — E1165 Type 2 diabetes mellitus with hyperglycemia: Secondary | ICD-10-CM | POA: Insufficient documentation

## 2021-03-11 DIAGNOSIS — D582 Other hemoglobinopathies: Secondary | ICD-10-CM | POA: Insufficient documentation

## 2021-03-26 ENCOUNTER — Other Ambulatory Visit: Payer: Self-pay

## 2021-03-26 ENCOUNTER — Encounter: Payer: Self-pay | Admitting: Dietician

## 2021-03-26 ENCOUNTER — Encounter: Payer: Medicare PPO | Admitting: Dietician

## 2021-03-26 VITALS — Ht 74.0 in | Wt 234.7 lb

## 2021-03-26 DIAGNOSIS — E119 Type 2 diabetes mellitus without complications: Secondary | ICD-10-CM

## 2021-03-26 NOTE — Progress Notes (Signed)

## 2021-04-02 ENCOUNTER — Other Ambulatory Visit: Payer: Self-pay

## 2021-04-02 ENCOUNTER — Encounter: Payer: Medicare PPO | Attending: Family Medicine | Admitting: *Deleted

## 2021-04-02 ENCOUNTER — Encounter: Payer: Self-pay | Admitting: *Deleted

## 2021-04-02 VITALS — Wt 234.1 lb

## 2021-04-02 DIAGNOSIS — E119 Type 2 diabetes mellitus without complications: Secondary | ICD-10-CM | POA: Diagnosis present

## 2021-04-02 NOTE — Progress Notes (Signed)

## 2021-04-09 ENCOUNTER — Encounter: Payer: Self-pay | Admitting: *Deleted

## 2021-04-09 ENCOUNTER — Telehealth: Payer: Self-pay

## 2021-04-09 ENCOUNTER — Other Ambulatory Visit: Payer: Self-pay

## 2021-04-09 ENCOUNTER — Encounter: Payer: Self-pay | Admitting: Dietician

## 2021-04-09 ENCOUNTER — Encounter: Payer: Medicare PPO | Admitting: Dietician

## 2021-04-09 VITALS — BP 128/86 | Ht 74.0 in | Wt 232.5 lb

## 2021-04-09 DIAGNOSIS — E119 Type 2 diabetes mellitus without complications: Secondary | ICD-10-CM | POA: Diagnosis not present

## 2021-04-09 NOTE — Progress Notes (Signed)

## 2021-04-10 ENCOUNTER — Telehealth: Payer: Self-pay

## 2021-04-22 ENCOUNTER — Ambulatory Visit: Payer: Medicare PPO

## 2021-04-23 LAB — CBC WITH DIFFERENTIAL/PLATELET
Absolute Monocytes: 451 cells/uL (ref 200–950)
Basophils Absolute: 32 cells/uL (ref 0–200)
Basophils Relative: 0.6 %
Eosinophils Absolute: 69 cells/uL (ref 15–500)
Eosinophils Relative: 1.3 %
HCT: 47.3 % (ref 38.5–50.0)
Hemoglobin: 16.1 g/dL (ref 13.2–17.1)
Lymphs Abs: 2528 cells/uL (ref 850–3900)
MCH: 30.8 pg (ref 27.0–33.0)
MCHC: 34 g/dL (ref 32.0–36.0)
MCV: 90.6 fL (ref 80.0–100.0)
MPV: 9.4 fL (ref 7.5–12.5)
Monocytes Relative: 8.5 %
Neutro Abs: 2221 cells/uL (ref 1500–7800)
Neutrophils Relative %: 41.9 %
Platelets: 205 10*3/uL (ref 140–400)
RBC: 5.22 10*6/uL (ref 4.20–5.80)
RDW: 12.3 % (ref 11.0–15.0)
Total Lymphocyte: 47.7 %
WBC: 5.3 10*3/uL (ref 3.8–10.8)

## 2021-04-23 LAB — LIPID PANEL
Cholesterol: 147 mg/dL (ref <200)
HDL: 39 mg/dL — ABNORMAL LOW (ref >=40)
LDL Cholesterol (Calc): 82 mg/dL (calc)
Non-HDL Cholesterol (Calc): 108 mg/dL (calc) (ref <130)
Total CHOL/HDL Ratio: 3.8 (calc) (ref <5.0)
Triglycerides: 163 mg/dL — ABNORMAL HIGH (ref <150)

## 2021-04-23 LAB — HEMOGLOBIN A1C
Hgb A1c MFr Bld: 6.4 % of total Hgb — ABNORMAL HIGH (ref <5.7)
Mean Plasma Glucose: 137 mg/dL
eAG (mmol/L): 7.6 mmol/L

## 2021-04-23 LAB — MICROALBUMIN, URINE: Microalb, Ur: 0.7 mg/dL

## 2021-04-23 LAB — COMPLETE METABOLIC PANEL WITH GFR
AG Ratio: 2.3 (calc) (ref 1.0–2.5)
ALT: 31 U/L (ref 9–46)
AST: 22 U/L (ref 10–35)
Albumin: 4.6 g/dL (ref 3.6–5.1)
Alkaline phosphatase (APISO): 76 U/L (ref 35–144)
BUN: 18 mg/dL (ref 7–25)
CO2: 29 mmol/L (ref 20–32)
Calcium: 9.5 mg/dL (ref 8.6–10.3)
Chloride: 105 mmol/L (ref 98–110)
Creat: 0.97 mg/dL (ref 0.70–1.18)
GFR, Est African American: 91 mL/min/{1.73_m2} (ref >=60)
GFR, Est Non African American: 79 mL/min/{1.73_m2} (ref >=60)
Globulin: 2 g/dL (calc) (ref 1.9–3.7)
Glucose, Bld: 121 mg/dL — ABNORMAL HIGH (ref 65–99)
Potassium: 3.8 mmol/L (ref 3.5–5.3)
Sodium: 140 mmol/L (ref 135–146)
Total Bilirubin: 0.6 mg/dL (ref 0.2–1.2)
Total Protein: 6.6 g/dL (ref 6.1–8.1)

## 2021-04-27 ENCOUNTER — Encounter: Payer: Self-pay | Admitting: Family Medicine

## 2021-04-28 MED ORDER — PITAVASTATIN CALCIUM 2 MG PO TABS: 2.0000 mg | ORAL_TABLET | Freq: Every day | ORAL | 1 refills | Status: DC

## 2021-05-04 ENCOUNTER — Other Ambulatory Visit: Payer: Self-pay | Admitting: Family Medicine

## 2021-07-02 ENCOUNTER — Other Ambulatory Visit: Payer: Self-pay | Admitting: Family Medicine

## 2021-07-02 DIAGNOSIS — E785 Hyperlipidemia, unspecified: Secondary | ICD-10-CM

## 2021-07-02 DIAGNOSIS — E786 Lipoprotein deficiency: Secondary | ICD-10-CM

## 2021-07-02 DIAGNOSIS — E781 Pure hyperglyceridemia: Secondary | ICD-10-CM

## 2021-07-14 ENCOUNTER — Other Ambulatory Visit: Payer: Self-pay

## 2021-07-14 ENCOUNTER — Telehealth: Payer: Self-pay | Admitting: Family Medicine

## 2021-07-14 DIAGNOSIS — E781 Pure hyperglyceridemia: Secondary | ICD-10-CM

## 2021-07-14 MED ORDER — NIACIN ER (ANTIHYPERLIPIDEMIC) 500 MG PO TBCR: 1500.0000 mg | EXTENDED_RELEASE_TABLET | Freq: Every day | ORAL | 3 refills | Status: DC

## 2021-07-14 MED ORDER — ROSUVASTATIN CALCIUM 5 MG PO TABS: 5.0000 mg | ORAL_TABLET | Freq: Every day | ORAL | 3 refills | Status: DC

## 2021-07-14 NOTE — Telephone Encounter (Signed)
Called patient he states the pitvastatin his ins did not approve and he has been taking the crestor with no problem.  Since not on med list he need refill sent to [facility]

## 2021-07-14 NOTE — Telephone Encounter (Signed)
Patient requesting Crestor which is not on list. He states he still takes.Please advise

## 2021-07-14 NOTE — Telephone Encounter (Signed)
Copied from [place] [id number]. Topic: Quick Communication - Rx Refill/Question >> Jul 14, 2021  8:26 AM Leward Quan A wrote: Medication: rosuvastatin (CRESTOR) 5 MG tablet  Per patient he is still taking this medication  Has the patient contacted their pharmacy? Yes.   (Agent: If no, request that the patient contact the pharmacy for the refill.) (Agent: If yes, when and what did the pharmacy advise?)  Preferred Pharmacy (with phone number or street name): [facility] - [place], [place]  Phone:  [phone] Fax:  [phone]     Agent: Please be advised that RX refills may take up to 3 business days. We ask that you follow-up with your pharmacy.

## 2021-07-20 ENCOUNTER — Other Ambulatory Visit: Payer: Self-pay

## 2021-07-20 ENCOUNTER — Telehealth (INDEPENDENT_AMBULATORY_CARE_PROVIDER_SITE_OTHER): Payer: Medicare PPO | Admitting: Family Medicine

## 2021-07-20 ENCOUNTER — Encounter: Payer: Self-pay | Admitting: Family Medicine

## 2021-07-20 VITALS — Temp 99.2°F

## 2021-07-20 DIAGNOSIS — J069 Acute upper respiratory infection, unspecified: Secondary | ICD-10-CM

## 2021-07-20 NOTE — Progress Notes (Signed)
Virtual Visit via Video Note  I connected with Alan Holt on 07/20/21 at 11:40 AM EDT by a video enabled telemedicine application and verified that I am speaking with the correct person using two identifiers.  Location: Patient: home Provider: [facility]   I discussed the limitations of evaluation and management by telemedicine and the availability of in person appointments. The patient expressed understanding and agreed to proceed.  History of Present Illness:  UPPER RESPIRATORY TRACT INFECTION - reports temp 99.55F since yesterday with drainage and scratchy throat. Home COVID test negative.  - is going to be around elderly family members and wants to be tested - daughter tested positive for COVID about 3 weeks ago.  Fever: no Cough: no Shortness of breath: no Wheezing: no Chest pain: no Chest tightness: no Chest congestion: no Nasal congestion:  at night Runny nose: yes Sore throat: yes Headache: no Face pain: no Ear pain: no  Ear pressure: no  Vomiting: no Relief with OTC cold/cough medications: yes  Treatments attempted: ibuprofen    Observations/Objective:  Well appearing, in NAD. No respiratory distress.  Assessment and Plan:  Viral URI Doing well with mild sx. Will test for COVID. If positive, would be a candidate for COVID tx. Reviewed OTC symptom relief, self-quarantine guidelines, and emergency precautions.     I discussed the assessment and treatment plan with the patient. The patient was provided an opportunity to ask questions and all were answered. The patient agreed with the plan and demonstrated an understanding of the instructions.   The patient was advised to call back or seek an in-person evaluation if the symptoms worsen or if the condition fails to improve as anticipated.  I provided 7 minutes of non-face-to-face time during this encounter.   Myles Gip, DO

## 2021-07-20 NOTE — Patient Instructions (Signed)
It was great to see you!  Our plans for today:  - See below for self-isolation guidelines. You may end your quarantine if your test is negative or if positive, once you are 5 days from symptom onset and fever free for 24 hours without use of tylenol or ibuprofen. Wear a well-fitting N95 mask for an additional 5 days if positive. - We will let you know the results of your COVID test. - If your test is positive, we will recommend treatment with an oral antiviral called paxlovid. - Certainly, if you are having difficulties breathing or unable to keep down fluids, go to the Emergency Department.   Take care and seek immediate care sooner if you develop any concerns.   Dr. Ky Barban

## 2021-07-21 LAB — SARS-COV-2, NAA 2 DAY TAT

## 2021-07-21 LAB — NOVEL CORONAVIRUS, NAA: SARS-CoV-2, NAA: NOT DETECTED

## 2021-09-24 LAB — HM DIABETES EYE EXAM

## 2021-09-25 ENCOUNTER — Encounter: Payer: Self-pay | Admitting: Family Medicine

## 2021-10-06 ENCOUNTER — Ambulatory Visit: Payer: Medicare PPO | Admitting: Dermatology

## 2021-10-06 ENCOUNTER — Other Ambulatory Visit: Payer: Self-pay

## 2021-10-06 DIAGNOSIS — L821 Other seborrheic keratosis: Secondary | ICD-10-CM

## 2021-10-06 DIAGNOSIS — L814 Other melanin hyperpigmentation: Secondary | ICD-10-CM

## 2021-10-06 DIAGNOSIS — L570 Actinic keratosis: Secondary | ICD-10-CM

## 2021-10-06 DIAGNOSIS — L578 Other skin changes due to chronic exposure to nonionizing radiation: Secondary | ICD-10-CM | POA: Diagnosis not present

## 2021-10-06 DIAGNOSIS — L820 Inflamed seborrheic keratosis: Secondary | ICD-10-CM | POA: Diagnosis not present

## 2021-10-06 DIAGNOSIS — Z86018 Personal history of other benign neoplasm: Secondary | ICD-10-CM

## 2021-10-06 DIAGNOSIS — D229 Melanocytic nevi, unspecified: Secondary | ICD-10-CM

## 2021-10-06 DIAGNOSIS — Z1283 Encounter for screening for malignant neoplasm of skin: Secondary | ICD-10-CM | POA: Diagnosis not present

## 2021-10-06 DIAGNOSIS — D1800 Hemangioma unspecified site: Secondary | ICD-10-CM

## 2021-10-06 NOTE — Progress Notes (Signed)
Follow-Up Visit   Subjective  Alan Holt is a 70 y.o. male who presents for the following: Annual Exam (History of dysplastic nevi - TBSE today). The patient presents for Total-Body Skin Exam (TBSE) for skin cancer screening and mole check.  The following portions of the chart were reviewed this encounter and updated as appropriate:   Tobacco  Allergies  Meds  Problems  Med Hx  Surg Hx  Fam Hx     Review of Systems:  No other skin or systemic complaints except as noted in HPI or Assessment and Plan.  Objective  Well appearing patient in no apparent distress; mood and affect are within normal limits.  A full examination was performed including scalp, head, eyes, ears, nose, lips, neck, chest, axillae, abdomen, back, buttocks, bilateral upper extremities, bilateral lower extremities, hands, feet, fingers, toes, fingernails, and toenails. All findings within normal limits unless otherwise noted below.  Right cheek (2) Erythematous thin papules/macules with gritty scale.   Back and shoulders x 17 (17) Erythematous keratotic or waxy stuck-on papule or plaque.    Assessment & Plan   Lentigines - Scattered tan macules - Due to sun exposure - Benign-appearing, observe - Recommend daily broad spectrum sunscreen SPF 30+ to sun-exposed areas, reapply every 2 hours as needed. - Call for any changes  Seborrheic Keratoses - Stuck-on, waxy, tan-brown papules and/or plaques  - Benign-appearing - Discussed benign etiology and prognosis. - Observe - Call for any changes  Melanocytic Nevi - Tan-brown and/or pink-flesh-colored symmetric macules and papules - Benign appearing on exam today - Observation - Call clinic for new or changing moles - Recommend daily use of broad spectrum spf 30+ sunscreen to sun-exposed areas.   Hemangiomas - Red papules - Discussed benign nature - Observe - Call for any changes  Actinic Damage - Chronic condition, secondary to cumulative  UV/sun exposure - diffuse scaly erythematous macules with underlying dyspigmentation - Recommend daily broad spectrum sunscreen SPF 30+ to sun-exposed areas, reapply every 2 hours as needed.  - Staying in the shade or wearing long sleeves, sun glasses (UVA+UVB protection) and wide brim hats (4-inch brim around the entire circumference of the hat) are also recommended for sun protection.  - Call for new or changing lesions.  Skin cancer screening performed today.  AK (actinic keratosis) (2) Right cheek  Destruction of lesion - Right cheek Complexity: simple   Destruction method: cryotherapy   Informed consent: discussed and consent obtained   Timeout:  patient name, date of birth, surgical site, and procedure verified Lesion destroyed using liquid nitrogen: Yes   Region frozen until ice ball extended beyond lesion: Yes   Outcome: patient tolerated procedure well with no complications   Post-procedure details: wound care instructions given    Inflamed seborrheic keratosis Back and shoulders x 17  Destruction of lesion - Back and shoulders x 17 Complexity: simple   Destruction method: cryotherapy   Informed consent: discussed and consent obtained   Timeout:  patient name, date of birth, surgical site, and procedure verified Lesion destroyed using liquid nitrogen: Yes   Region frozen until ice ball extended beyond lesion: Yes   Outcome: patient tolerated procedure well with no complications   Post-procedure details: wound care instructions given    Return in about 1 year (around 10/06/2022) for TBSE.  I, Ashok Cordia, CMA, am acting as scribe for Sarina Ser, MD . Documentation: I have reviewed the above documentation for accuracy and completeness, and I agree with the above.  Sarina Ser, MD

## 2021-10-06 NOTE — Patient Instructions (Signed)

## 2021-10-07 ENCOUNTER — Encounter: Payer: Self-pay | Admitting: Dermatology

## 2021-10-18 ENCOUNTER — Encounter: Payer: Self-pay | Admitting: Family Medicine

## 2021-10-20 ENCOUNTER — Ambulatory Visit: Payer: Medicare PPO | Admitting: Dermatology

## 2021-10-21 NOTE — Telephone Encounter (Signed)
Need OV to discuss prior to prescribing new meds - recommend he get appt thank you

## 2021-10-23 NOTE — Progress Notes (Signed)
Acute Office Visit  Subjective:    Patient ID: Alan Holt, male    DOB: 1951/09/21, 70 y.o.   MRN: [id number]  Chief Complaint  Patient presents with   Hyperlipidemia    Stopped crestor due to reaction   Medication Reaction    Body aches and dark urine    HPI Patient is in today for medication reaction. About 3 weeks ago he noticed that his urine was a dark color. He read about this online and stopped his Crestor 5 mg with resolution of symptoms. He had been on this medication for about 1 year and had been on a different statin previously but this was switched and he wasn't sure why. He does endorse myalgias while on the medication, but notes it is mild and he it doesn't affect him much. He has noticed this resolving since stopping the Crestor. He denies gross hematuria, dysuria, increased urin ary urgency or frequency, fever, flank pain, suprapubic pain, chest pain or shortness of breath. He denies any other changes lately. He does have type 2 diabetes that is controlled with diet but denies increasing protein consumption. He does walk 10-12 miles a week and has been doing this for some time. He denies increase in physical activity lately and denies lifting or other types of exercise. Overall he is feeling well and would like to discuss an alternative medication.   Last lipid panel was in 4/22, which was overall normal.   Diabetes, Type 2: -Last A1c 6.4 in 4/22 -Medications: None -Patient is compliant with the above medications and reports no side effects.   Past Medical History:  Diagnosis Date   Allergy    Diabetes mellitus without complication ([place])    Dysplastic nevus 02/25/2014   L mid ant thigh - mild   Dysplastic nevus 02/25/2014   R med popliteal - mild   Dysplastic nevus 02/25/2014   R med knee - mild   Dysplastic nevus 12/30/2016   R pectoral 2.0 cm med to areola - mod   Dysplastic nevus 12/30/2016   L dorsum prox forearm near antecubital - mod   Elevated BP  without diagnosis of hypertension 11/10/2016   Hypertension    Prediabetes 05/10/2018   Sleep apnea    Thyroid disease     Past Surgical History:  Procedure Laterality Date   HERNIA REPAIR     childhood   KNEE SURGERY Left 1993   MOUTH SURGERY  04/05/2018   bone graft, 3 teeth pulled   THYROID SURGERY  2015   TOTAL THYROIDECTOMY Bilateral 07/15/2014   Dr. Kathyrn Sheriff.     Family History  Problem Relation Age of Onset   Diabetes Mother    Alcohol abuse Father    Stroke Father    Diabetes Brother    Heart attack Brother     Social History   Socioeconomic History   Marital status: Married    Spouse name: Not on file   Number of children: Not on file   Years of education: Not on file   Highest education level: Master's degree (e.g., MA, MS, MEng, MEd, MSW, MBA)  Occupational History    Comment: retired Careers information officer  Tobacco Use   Smoking status: Never   Smokeless tobacco: Never  Vaping Use   Vaping Use: Never used  Substance and Sexual Activity   Alcohol use: No    Alcohol/week: 0.0 standard drinks   Drug use: No   Sexual activity: Not Currently  Other Topics Concern  Not on file  Social History Narrative   Not on file   Social Determinants of Health   Financial Resource Strain: Low Risk    Difficulty of Paying Living Expenses: Not very hard  Food Insecurity: No Food Insecurity   Worried About Running Out of Food in the Last Year: Never true   Ran Out of Food in the Last Year: Never true  Transportation Needs: No Transportation Needs   Lack of Transportation (Medical): No   Lack of Transportation (Non-Medical): No  Physical Activity: Sufficiently Active   Days of Exercise per Week: 5 days   Minutes of Exercise per Session: 60 min  Stress: No Stress Concern Present   Feeling of Stress : Only a little  Social Connections: Unknown   Frequency of Communication with Friends and Family: More than three times a week   Frequency of Social Gatherings with Friends  and Family: Three times a week   Attends Religious Services: Not on file   Active Member of Clubs or Organizations: No   Attends Archivist Meetings: Never   Marital Status: Married  Human resources officer Violence: Not At Risk   Fear of Current or Ex-Partner: No   Emotionally Abused: No   Physically Abused: No   Sexually Abused: No    Outpatient Medications Prior to Visit  Medication Sig Dispense Refill   aspirin 81 MG tablet Take 81 mg by mouth daily.     Blood Glucose Monitoring Suppl (ACCU-CHEK GUIDE) w/Device KIT See admin instructions. (Patient not taking: Reported on 07/20/2021)     calcium citrate (CALCITRATE - DOSED IN MG ELEMENTAL CALCIUM) 950 MG tablet Take 1,200 mg of elemental calcium by mouth daily.     cholecalciferol (VITAMIN D) 1000 units tablet Take 1,000 Units by mouth daily.     Glucose Blood (BLOOD GLUCOSE TEST STRIPS) STRP Use as directed to monitor FSBS once daily prn. Dx: E11.65 (Patient not taking: Reported on 07/20/2021) 100 strip 3   ibuprofen (ADVIL,MOTRIN) 600 MG tablet Take 600 mg by mouth every 8 (eight) hours as needed. Pt takes rarely, 1-2 times per week     Lancets MISC Use as directed to monitor FSBS once daily prn. Dx: E11.65 (Patient not taking: Reported on 07/20/2021) 100 each 3   levothyroxine (SYNTHROID, LEVOTHROID) 150 MCG tablet Take 1 tablet by mouth daily.     niacin (NIASPAN) 500 MG CR tablet Take 3 tablets (1,500 mg total) by mouth at bedtime. 270 tablet 3   rosuvastatin (CRESTOR) 5 MG tablet Take 1 tablet (5 mg total) by mouth at bedtime. 90 tablet 3   No facility-administered medications prior to visit.    Allergies  Allergen Reactions   Erythromycin Rash    Review of Systems  Constitutional:  Negative for chills and fever.  Respiratory:  Negative for cough and shortness of breath.   Cardiovascular:  Negative for chest pain and palpitations.  Gastrointestinal:  Negative for abdominal pain, diarrhea, nausea and vomiting.   Genitourinary:  Negative for dysuria, flank pain, frequency, hematuria and urgency.  Musculoskeletal:  Negative for arthralgias and myalgias.  Skin: Negative.       Objective:    Physical Exam Constitutional:      Appearance: Normal appearance.  HENT:     Head: Normocephalic and atraumatic.  Eyes:     Conjunctiva/sclera: Conjunctivae normal.  Cardiovascular:     Rate and Rhythm: Normal rate and regular rhythm.  Pulmonary:     Effort: Pulmonary effort is normal.  Breath sounds: Normal breath sounds.  Abdominal:     General: There is no distension.     Palpations: Abdomen is soft.     Tenderness: There is no abdominal tenderness. There is no right CVA tenderness, left CVA tenderness or guarding.  Musculoskeletal:     Right lower leg: No edema.     Left lower leg: No edema.  Skin:    General: Skin is warm and dry.  Neurological:     General: No focal deficit present.     Mental Status: He is alert. Mental status is at baseline.  Psychiatric:        Mood and Affect: Mood normal.        Behavior: Behavior normal.    BP 116/74   Pulse 73   Temp 98.1 F (36.7 C)   Resp 16   Ht '6\' 2"'  (1.88 m)   Wt 226 lb 4.8 oz (102.6 kg)   SpO2 98%   BMI 29.06 kg/m  Wt Readings from Last 3 Encounters:  04/09/21 232 lb 8 oz (105.5 kg)  04/02/21 234 lb 1.6 oz (106.2 kg)  03/26/21 234 lb 11.2 oz (106.5 kg)    Health Maintenance Due  Topic Date Due   FOOT EXAM  Never done   Zoster Vaccines- Shingrix (1 of 2) Never done   COVID-19 Vaccine (4 - Booster for Pfizer series) 12/08/2020   INFLUENZA VACCINE  07/27/2021   HEMOGLOBIN A1C  10/22/2021    There are no preventive care reminders to display for this patient.   No results found for: TSH Lab Results  Component Value Date   WBC 5.3 04/22/2021   HGB 16.1 04/22/2021   HCT 47.3 04/22/2021   MCV 90.6 04/22/2021   PLT 205 04/22/2021   Lab Results  Component Value Date   NA 140 04/22/2021   K 3.8 04/22/2021   CO2 29  04/22/2021   GLUCOSE 121 (H) 04/22/2021   BUN 18 04/22/2021   CREATININE 0.97 04/22/2021   BILITOT 0.6 04/22/2021   ALKPHOS 66 05/03/2017   AST 22 04/22/2021   ALT 31 04/22/2021   PROT 6.6 04/22/2021   ALBUMIN 4.3 05/03/2017   CALCIUM 9.5 04/22/2021   ANIONGAP 11 11/08/2014   Lab Results  Component Value Date   CHOL 147 04/22/2021   Lab Results  Component Value Date   HDL 39 (L) 04/22/2021   Lab Results  Component Value Date   LDLCALC 82 04/22/2021   Lab Results  Component Value Date   TRIG 163 (H) 04/22/2021   Lab Results  Component Value Date   CHOLHDL 3.8 04/22/2021   Lab Results  Component Value Date   HGBA1C 6.4 (H) 04/22/2021       Assessment & Plan:   1. Dark urine/On statin therapy: Urine dip today to rule out protein in urine and check CMP, CPK to assess kidney function, liver function and to rule out rhabdo. Recommend stopping statin for now, did discuss a re-challenge of either Crestor or Pravastatin 40 at his next appointment in 2 months and letting us know immediately is he experiences these symptoms again. Niacin may also be increasing risk, can consider stopping this when restarting statin.   - CK (Creatine Kinase) - POCT urinalysis dipstick - COMPLETE METABOLIC PANEL WITH GFR  2. Hyperlipidemia with low HDL: Last lipid panel in the spring good, recheck after being off statin.   3. Type 2 diabetes mellitus without complication, without long-term current use of insulin ([place]):  Stable, last A1c 6.4% 6 months ago, recheck with above labs.   - HgB A1c - POCT urinalysis dipstick - COMPLETE METABOLIC PANEL WITH GFR  4. Screening for prostate cancer: PSA with above labs, last checked in 2019, was normal at that time.   - PSA   Teodora Medici, DO

## 2021-10-26 ENCOUNTER — Other Ambulatory Visit: Payer: Self-pay

## 2021-10-26 ENCOUNTER — Ambulatory Visit: Payer: Medicare PPO | Admitting: Internal Medicine

## 2021-10-26 ENCOUNTER — Encounter: Payer: Self-pay | Admitting: Internal Medicine

## 2021-10-26 VITALS — BP 116/74 | HR 73 | Temp 98.1°F | Resp 16 | Ht 74.0 in | Wt 226.3 lb

## 2021-10-26 DIAGNOSIS — R82998 Other abnormal findings in urine: Secondary | ICD-10-CM | POA: Diagnosis not present

## 2021-10-26 DIAGNOSIS — Z79899 Other long term (current) drug therapy: Secondary | ICD-10-CM

## 2021-10-26 DIAGNOSIS — E785 Hyperlipidemia, unspecified: Secondary | ICD-10-CM | POA: Diagnosis not present

## 2021-10-26 DIAGNOSIS — E119 Type 2 diabetes mellitus without complications: Secondary | ICD-10-CM

## 2021-10-26 DIAGNOSIS — Z789 Other specified health status: Secondary | ICD-10-CM

## 2021-10-26 DIAGNOSIS — E786 Lipoprotein deficiency: Secondary | ICD-10-CM

## 2021-10-26 DIAGNOSIS — Z125 Encounter for screening for malignant neoplasm of prostate: Secondary | ICD-10-CM

## 2021-10-26 LAB — POCT URINALYSIS DIPSTICK
Bilirubin, UA: NEGATIVE
Blood, UA: NEGATIVE
Glucose, UA: NEGATIVE
Ketones, UA: NEGATIVE
Leukocytes, UA: NEGATIVE
Nitrite, UA: NEGATIVE
Odor: NORMAL
Protein, UA: NEGATIVE
Spec Grav, UA: 1.015 (ref 1.010–1.025)
Urobilinogen, UA: 0.2 E.U./dL
pH, UA: 5 (ref 5.0–8.0)

## 2021-10-26 NOTE — Patient Instructions (Addendum)
It was great seeing you today!  Plan discussed at today's visit: -Blood work ordered today, results will be uploaded to [facility].  -For now, stop taking statin and follow up in December to discuss a re-challenge   Statin Intolerance Statin intolerance is the inability to take a certain type of cholesterol-lowering medicine (statin) because of unwanted side effects, such as muscle pain. Statins may be prescribed to decrease cholesterol levels and lower the risk for heart attack, heart disease, and stroke. People with statin intolerance may experience muscle pain and cramps (myalgia) that go away when the statin is stopped. What are the causes? The cause of this condition is not known. What increases the risk? You may be at higher risk for statin intolerance if you: Take more than one type of cholesterol-lowering medicine at a time. Need a higher-than-normal dosage of a statin. Have any of these medical problems: A history of surgery or trauma. Kidney, liver, joint, or muscle disease. A low level of hormones that control how your body uses energy (hypothyroidism). A lack of vitamin D (deficiency). Have a family history of muscle aches with or without statin therapy. Drink a lot of alcohol. Have any of these characteristics: Being older than 71 years of age. Having a body size that is smaller than normal. Being male. Being of Asian descent. Take certain medicines, including: Certain medicines for mental illness (antipsychotics). Some types of antibiotics or antifungals. Certain medicines used for blood pressure or heart disease. Medicines that reduce the activity of the body's disease-fighting system (immunosuppressants). Medicines to treat hepatitis C and HIV or AIDS (human immunodeficiency virus or acquired immunodeficiency syndrome). Follow an intense exercise program. Drink a lot of grapefruit juice. What are the signs or symptoms? Symptoms of statin intolerance  include: General muscle aches, or myalgia. This may feel similar to muscle aches that are caused by the flu. Muscle pain, tenderness, cramps, or weakness (myositis). Severe muscle pain, weakness, and raised blood CK levels (rhabdomyolysis). Symptoms usually go away when the statin is stopped. Rarely, liver damage can also occur, which can cause: Dark yellow or brown urine. Light-colored stool (feces). Loss of appetite. Pain in the upper right abdomen. Unusual weakness or fatigue. Yellowing of the skin or the white parts of the eyes (jaundice). How is this diagnosed? This condition is diagnosed based on your symptoms, your medical history, and a physical exam. You may also have blood tests. How is this treated? Your health care provider may have you stop taking the statin for a short time to see if your symptoms go away. Then your provider may restart your statin or: Change you to a different statin. Lower the dosage of your statin. Have you take your statin less often. Change you to another type of cholesterol-lowering medicine. Stop or change any medicines that might be interfering with your statin. Limit how much grapefruit juice you drink. Recommend stopping intense exercise. In most cases, statin intolerance can be managed, and you can continue to take a cholesterol-lowering medicine. Follow these instructions at home: Medicines Take your statin medicine as told by your health care provider. Do not stop taking the statin unless your health care provider tells you to stop. Take other over-the-counter and prescription medicines only as told by your health care provider. Check with your health care provider before taking any new medicines. Certain medicines can increase your risk for statin intolerance. Lifestyle If you drink alcohol: Limit how much you have to: 0-1 drink a day for women who are  not pregnant. 0-2 drinks a day for men. Know how much alcohol is in a drink. In the  [place] one drink equals one 12 oz bottle of beer (355 mL), one 5 oz glass of wine (148 mL), or one 1 oz glass of hard liquor (44 mL). Do not use any products that contain nicotine or tobacco. These products include cigarettes, chewing tobacco, and vaping devices, such as e-cigarettes. If you need help quitting, ask your health care provider. General instructions Have blood tests to check CK levels or liver enzymes as told by your health care provider. Exercise as directed. Ask your health care provider what exercises are best for you. Do not start a new exercise program before talking about it with your health care provider. Follow instructions from your health care provider about eating or drinking restrictions. Your health care provider may recommend: Limiting the amount of grapefruit juice you drink, or not drinking it at all. Eating a diet that is low in saturated fats and high in fiber. Maintain a healthy weight with diet and exercise. Keep all follow-up visits. This is important. Contact a health care provider if: You have any symptoms of statin intolerance. Summary Statins are important medicines for decreasing your cholesterol, which may reduce your risk for heart attack and stroke. Some people are not able to continue taking a particular statin because of muscle problems (myalgia) or other side effects. Myalgia is the most common symptom of statin intolerance. Often, the muscle pain and cramps from myalgia go away when the statin is stopped. Although rare, liver damage can occur as a result of statin intolerance. You should have routine blood tests to check your liver enzymes. In most cases, statin intolerance can be managed, and you can continue to take a cholesterol-lowering medicine. This information is not intended to replace advice given to you by your health care provider. Make sure you discuss any questions you have with your health care provider. Document Revised: 02/12/2021  Document Reviewed: 02/12/2021 Elsevier Patient Education  2022 [place].     Follow up in: 2 months   Take care and let us know if you have any questions or concerns prior to your next visit.  Dr. Rosana Berger

## 2021-10-27 LAB — HEMOGLOBIN A1C
Hgb A1c MFr Bld: 6.3 % of total Hgb — ABNORMAL HIGH (ref <5.7)
Mean Plasma Glucose: 134 mg/dL
eAG (mmol/L): 7.4 mmol/L

## 2021-10-27 LAB — COMPLETE METABOLIC PANEL WITH GFR
AG Ratio: 2.3 (calc) (ref 1.0–2.5)
ALT: 34 U/L (ref 9–46)
AST: 25 U/L (ref 10–35)
Albumin: 4.9 g/dL (ref 3.6–5.1)
Alkaline phosphatase (APISO): 81 U/L (ref 35–144)
BUN: 20 mg/dL (ref 7–25)
CO2: 28 mmol/L (ref 20–32)
Calcium: 10 mg/dL (ref 8.6–10.3)
Chloride: 104 mmol/L (ref 98–110)
Creat: 0.92 mg/dL (ref 0.70–1.28)
Globulin: 2.1 g/dL (calc) (ref 1.9–3.7)
Glucose, Bld: 141 mg/dL — ABNORMAL HIGH (ref 65–99)
Potassium: 4.3 mmol/L (ref 3.5–5.3)
Sodium: 141 mmol/L (ref 135–146)
Total Bilirubin: 0.6 mg/dL (ref 0.2–1.2)
Total Protein: 7 g/dL (ref 6.1–8.1)
eGFR: 89 mL/min/{1.73_m2} (ref >=60)

## 2021-10-27 LAB — PSA: PSA: 1.76 ng/mL (ref <=4.00)

## 2021-10-27 LAB — CK: Total CK: 226 U/L — ABNORMAL HIGH (ref 44–196)

## 2021-10-27 NOTE — Addendum Note (Signed)
Addended by: Teodora Medici on: 10/27/2021 08:12 AM   Modules accepted: Orders

## 2021-11-10 ENCOUNTER — Encounter: Payer: Self-pay | Admitting: *Deleted

## 2021-12-03 ENCOUNTER — Ambulatory Visit: Payer: Self-pay | Admitting: *Deleted

## 2021-12-03 NOTE — Telephone Encounter (Signed)
Patient had CK lab drawn on 10/26/21 due to Rosuvastatin causing dark urine. Has not taken this medication since. Asking if there is a follow-up CK lab ordered.    Additional Notes: Informed the patient there is a lab order for CK and he could come in to have that drawn at this time.  Stated he will come in Monday.   Reason for Disposition . Caller has medicine question only, adult not sick, AND triager answers question  Answer Assessment - Initial Assessment Questions 1. NAME of MEDICATION: "What medicine are you calling about?"     Rosuvastatin 2. QUESTION: "What is your question?" (e.g., double dose of medicine, side effect)     Urine was dark while taking this med. 3. PRESCRIBING HCP: "Who prescribed it?" Reason: if prescribed by specialist, call should be referred to that group.      4. SYMPTOMS: "Do you have any symptoms?"     Dark urine only 5. SEVERITY: If symptoms are present, ask "Are they mild, moderate or severe?"      6. PREGNANCY:  "Is there any chance that you are pregnant?" "When was your last menstrual period?"  Protocols used: Medication Question Call-A-AH

## 2021-12-07 ENCOUNTER — Other Ambulatory Visit: Payer: Self-pay

## 2021-12-07 DIAGNOSIS — Z789 Other specified health status: Secondary | ICD-10-CM

## 2021-12-07 DIAGNOSIS — R82998 Other abnormal findings in urine: Secondary | ICD-10-CM

## 2021-12-07 LAB — CK: Total CK: 164 U/L (ref 44–196)

## 2021-12-08 ENCOUNTER — Encounter: Payer: Self-pay | Admitting: Family Medicine

## 2022-02-16 ENCOUNTER — Ambulatory Visit (INDEPENDENT_AMBULATORY_CARE_PROVIDER_SITE_OTHER): Payer: Medicare PPO

## 2022-02-16 DIAGNOSIS — Z0000 Encounter for general adult medical examination without abnormal findings: Secondary | ICD-10-CM

## 2022-02-16 NOTE — Progress Notes (Signed)
Subjective:   Alan Holt is a 71 y.o. male who presents for Medicare Annual/Subsequent preventive examination.  Virtual Visit via Telephone Note  I connected with  Alan Holt on 02/16/22 at 10:40 AM EST by telephone and verified that I am speaking with the correct person using two identifiers.  Location: Patient: home Provider: Scott Persons participating in the virtual visit: [place]   I discussed the limitations, risks, security and privacy concerns of performing an evaluation and management service by telephone and the availability of in person appointments. The patient expressed understanding and agreed to proceed.  Interactive audio and video telecommunications were attempted between this nurse and patient, however failed, due to patient having technical difficulties OR patient did not have access to video capability.  We continued and completed visit with audio only.  Some vital signs may be absent or patient reported.   Clemetine Marker, LPN   Review of Systems     Cardiac Risk Factors include: advanced age (>98mn, >>82women);dyslipidemia;male gender;diabetes mellitus     Objective:    There were no vitals filed for this visit. There is no height or weight on file to calculate BMI.  Advanced Directives 02/16/2022 02/27/2021 02/12/2021 02/12/2020 02/06/2019 10/26/2017 05/03/2017  Does Patient Have a Medical Advance Directive? Yes Yes Yes Yes No Yes No  Type of AParamed[place]of [place] will [place] will [place]Living will [place]Living will - [place]Living will -  Does patient want to make changes to medical advance directive? - No - Patient declined - - - - -  Copy of HH[place]in Chart? No - copy requested - No - copy requested No - copy requested - No - copy requested -  Would patient like information on creating a  medical advance directive? - - - - Yes (MAU/Ambulatory/Procedural Areas - Information given) - -    Current Medications (verified) Outpatient Encounter Medications as of 02/16/2022  Medication Sig   aspirin 81 MG tablet Take 81 mg by mouth daily.   Blood Glucose Monitoring Suppl (ACCU-CHEK GUIDE) w/Device KIT See admin instructions.   calcium citrate (CALCITRATE - DOSED IN MG ELEMENTAL CALCIUM) 950 MG tablet Take 1,200 mg of elemental calcium by mouth daily.   cholecalciferol (VITAMIN D) 1000 units tablet Take 1,000 Units by mouth daily.   Glucose Blood (BLOOD GLUCOSE TEST STRIPS) STRP Use as directed to monitor FSBS once daily prn. Dx: E11.65   ibuprofen (ADVIL,MOTRIN) 600 MG tablet Take 600 mg by mouth every 8 (eight) hours as needed. Pt takes rarely, 1-2 times per week   Lancets MISC Use as directed to monitor FSBS once daily prn. Dx: E11.65   levothyroxine (SYNTHROID, LEVOTHROID) 150 MCG tablet Take 1 tablet by mouth daily.   Multiple Vitamin (MULTIVITAMIN ADULT PO)    niacin (NIASPAN) 500 MG CR tablet Take 3 tablets (1,500 mg total) by mouth at bedtime.   No facility-administered encounter medications on file as of 02/16/2022.    Allergies (verified) Statins and Erythromycin   History: Past Medical History:  Diagnosis Date   Allergy    Diabetes mellitus without complication ([place]    Dysplastic nevus 02/25/2014   L mid ant thigh - mild   Dysplastic nevus 02/25/2014   R med popliteal - mild   Dysplastic nevus 02/25/2014   R med knee - mild   Dysplastic nevus 12/30/2016   R pectoral 2.0  cm med to areola - mod   Dysplastic nevus 12/30/2016   L dorsum prox forearm near antecubital - mod   Elevated BP without diagnosis of hypertension 11/10/2016   Hypertension    Prediabetes 05/10/2018   Sleep apnea    Thyroid disease    Past Surgical History:  Procedure Laterality Date   HERNIA REPAIR     childhood   KNEE SURGERY Left 1993   MOUTH SURGERY  04/05/2018   bone graft, 3  teeth pulled   THYROID SURGERY  2015   TOTAL THYROIDECTOMY Bilateral 07/15/2014   Dr. Kathyrn Sheriff.    Family History  Problem Relation Age of Onset   Diabetes Mother    Alcohol abuse Father    Stroke Father    Diabetes Brother    Heart attack Brother    Social History   Socioeconomic History   Marital status: Married    Spouse name: Not on file   Number of children: Not on file   Years of education: Not on file   Highest education level: Master's degree (e.g., MA, MS, MEng, MEd, MSW, MBA)  Occupational History    Comment: retired Careers information officer  Tobacco Use   Smoking status: Never   Smokeless tobacco: Never  Vaping Use   Vaping Use: Never used  Substance and Sexual Activity   Alcohol use: No    Alcohol/week: 0.0 standard drinks   Drug use: No   Sexual activity: Not Currently  Other Topics Concern   Not on file  Social History Narrative   Not on file   Social Determinants of Health   Financial Resource Strain: Low Risk    Difficulty of Paying Living Expenses: Not hard at all  Food Insecurity: No Food Insecurity   Worried About Charity fundraiser in the Last Year: Never true   [place] in the Last Year: Never true  Transportation Needs: No Transportation Needs   Lack of Transportation (Medical): No   Lack of Transportation (Non-Medical): No  Physical Activity: Sufficiently Active   Days of Exercise per Week: 5 days   Minutes of Exercise per Session: 60 min  Stress: No Stress Concern Present   Feeling of Stress : Not at all  Social Connections: Moderately Integrated   Frequency of Communication with Friends and Family: More than three times a week   Frequency of Social Gatherings with Friends and Family: Three times a week   Attends Religious Services: More than 4 times per year   Active Member of Clubs or Organizations: No   Attends Archivist Meetings: Never   Marital Status: Married    Tobacco Counseling Counseling given: Not  Answered   Clinical Intake:  Pre-visit preparation completed: Yes  Pain : No/denies pain     Nutritional Risks: None Diabetes: Yes CBG done?: No Did pt. bring in CBG monitor from home?: No  How often do you need to have someone help you when you read instructions, pamphlets, or other written materials from your doctor or pharmacy?: 1 - Never  Nutrition Risk Assessment:  Has the patient had any N/V/D within the last 2 months?  No  Does the patient have any non-healing wounds?  No  Has the patient had any unintentional weight loss or weight gain?  No   Diabetes:  Is the patient diabetic?  Yes  If diabetic, was a CBG obtained today?  No  Did the patient bring in their glucometer from home?  No  How  often do you monitor your CBG's? 3-4 times per week .   Financial Strains and Diabetes Management:  Are you having any financial strains with the device, your supplies or your medication? No .  Does the patient want to be seen by Chronic Care Management for management of their diabetes?  No  Would the patient like to be referred to a Nutritionist or for Diabetic Management?  No   Diabetic Exams:  Diabetic Eye Exam: Completed 09/24/21.   Diabetic Foot Exam:. Pt has been advised about the importance in completing this exam. Pt is scheduled for diabetic foot exam on 04/23/22.    Interpreter Needed?: No  Information entered by :: Clemetine Marker LPN   Activities of Daily Living In your present state of health, do you have any difficulty performing the following activities: 02/16/2022 10/26/2021  Hearing? N N  Vision? N N  Difficulty concentrating or making decisions? N N  Walking or climbing stairs? N N  Dressing or bathing? N N  Doing errands, shopping? N N  Preparing Food and eating ? N -  Using the Toilet? N -  In the past six months, have you accidently leaked urine? N -  Do you have problems with loss of bowel control? N -  Managing your Medications? N -  Managing your  Finances? N -  Housekeeping or managing your Housekeeping? N -  Some recent data might be hidden    Patient Care Team: Teodora Medici, DO as PCP - General (Internal Medicine) Margaretha Sheffield, MD as Consulting Physician (Otolaryngology) Ralene Bathe, MD (Dermatology)  Indicate any recent Medical Services you may have received from other than [facility] providers in the past year (date may be approximate).     Assessment:   This is a routine wellness examination for Alan Holt.  Hearing/Vision screen Hearing Screening - Comments:: Pt denies hearing difficulty Vision Screening - Comments:: Annual vision screenings at [place] issues and exercise activities discussed: Current Exercise Habits: Home exercise routine, Type of exercise: walking (jogging), Time (Minutes): 60, Frequency (Times/Week): 5, Weekly Exercise (Minutes/Week): 300, Intensity: Moderate, Exercise limited by: None identified   Goals Addressed             This Visit's Progress    Exercise 150 minutes per week (moderate activity)   On track    Continue to remain active by jogging, or doing other strengthening exercises for a minimum of 150 minutes per week.       Depression Screen PHQ 2/9 Scores 02/16/2022 10/26/2021 07/20/2021 02/27/2021 02/20/2021 02/12/2021 02/06/2021  PHQ - 2 Score 0 0 0 0 0 0 0  PHQ- 9 Score - 0 - - - - 0    Fall Risk Fall Risk  02/16/2022 07/20/2021 04/09/2021 04/02/2021 03/26/2021  Falls in the past year? 0 0 0 0 0  Number falls in past yr: 0 0 - - -  Injury with Fall? 0 0 - - -  Risk for fall due to : No Fall Risks - - - -  Follow up Falls prevention discussed Falls evaluation completed - - -    FALL RISK PREVENTION PERTAINING TO THE HOME:  Any stairs in or around the home? No If so, are there any without handrails? No  Home free of loose throw rugs in walkways, pet beds, electrical cords, etc? Yes  Adequate lighting in your home to reduce risk of falls? Yes   ASSISTIVE  DEVICES UTILIZED TO PREVENT FALLS:  Life alert? No  Use of a cane, walker or w/c? No  Grab bars in the bathroom? No  Shower chair or bench in shower? No  Elevated toilet seat or a handicapped toilet? No   TIMED UP AND GO:  Was the test performed? No . Telephonic visit.   Cognitive Function: Normal cognitive status assessed by direct observation by this Nurse Health Advisor. No abnormalities found.       6CIT Screen 02/06/2019  What Year? 0 points  What month? 0 points  What time? 0 points  Count back from 20 0 points  Months in reverse 0 points  Repeat phrase 0 points  Total Score 0    Immunizations Immunization History  Administered Date(s) Administered   Fluad Quad(high Dose 65+) 10/12/2019   Influenza, High Dose Seasonal PF 10/23/2018, 10/20/2021   Influenza-Unspecified 09/27/2015, 10/05/2016, 09/26/2020   PFIZER(Purple Top)SARS-COV-2 Vaccination 01/21/2020, 02/12/2020, 10/13/2020, 05/27/2021   Pfizer Covid-19 Vaccine Bivalent Booster 29yr & up 10/20/2021   Pneumococcal Conjugate-13 10/26/2017   Pneumococcal Polysaccharide-23 11/09/2018   Tdap 11/08/2014    TDAP status: Up to date  Flu Vaccine status: Up to date  Pneumococcal vaccine status: Up to date  Covid-19 vaccine status: Completed vaccines  Qualifies for Shingles Vaccine? Yes   Zostavax completed No   Shingrix Completed?: Yes  per patient; need records  Screening Tests Health Maintenance  Topic Date Due   FOOT EXAM  Never done   Zoster Vaccines- Shingrix (1 of 2) Never done   URINE MICROALBUMIN  04/22/2022   HEMOGLOBIN A1C  04/25/2022   OPHTHALMOLOGY EXAM  09/24/2022   COLONOSCOPY (Pts 45-411yrInsurance coverage will need to be confirmed)  08/09/2023   TETANUS/TDAP  11/08/2024   Pneumonia Vaccine 6537Years old  Completed   INFLUENZA VACCINE  Completed   COVID-19 Vaccine  Completed   Hepatitis C Screening  Completed   HPV VACCINES  Aged Out    Health Maintenance  Health Maintenance Due   Topic Date Due   FOOT EXAM  Never done   Zoster Vaccines- Shingrix (1 of 2) Never done    Colorectal cancer screening: Type of screening: Colonoscopy. Completed 08/08/18. Repeat every 5 years  Lung Cancer Screening: (Low Dose CT Chest recommended if Age 616-80ears, 30 pack-year currently smoking OR have quit w/in 15years.) does not qualify.   Additional Screening:  Hepatitis C Screening: does qualify; Completed 02/01/18  Vision Screening: Recommended annual ophthalmology exams for early detection of glaucoma and other disorders of the eye. Is the patient up to date with their annual eye exam?  Yes  Who is the provider or what is the name of the office in which the patient attends annual [facility] exams[facility]  Dental Screening: Recommended annual dental exams for proper oral hygiene  Community Resource Referral / Chronic Care Management: CRR required this visit?  No   CCM required this visit?  No      Plan:     I have personally reviewed and noted the following in the patients chart:   Medical and social history Use of alcohol, tobacco or illicit drugs  Current medications and supplements including opioid prescriptions. Patient is not currently taking opioid prescriptions. Functional ability and status Nutritional status Physical activity Advanced directives List of other physicians Hospitalizations, surgeries, and ER visits in previous 12 months Vitals Screenings to include cognitive, depression, and falls Referrals and appointments  In addition, I have reviewed and discussed with patient certain preventive protocols, quality metrics, and best practice  recommendations. A written personalized care plan for preventive services as well as general preventive health recommendations were provided to patient.     Clemetine Marker, LPN   02/19/8249   Nurse Notes: pt scheduled for follow up and fasting labs on 04/23/21

## 2022-03-01 ENCOUNTER — Ambulatory Visit: Payer: Medicare PPO | Admitting: Physician Assistant

## 2022-03-01 ENCOUNTER — Telehealth: Payer: Self-pay

## 2022-03-01 ENCOUNTER — Ambulatory Visit: Payer: Self-pay

## 2022-03-01 ENCOUNTER — Ambulatory Visit: Payer: Medicare PPO | Admitting: Family Medicine

## 2022-03-01 ENCOUNTER — Encounter: Payer: Self-pay | Admitting: Physician Assistant

## 2022-03-01 ENCOUNTER — Other Ambulatory Visit: Payer: Self-pay

## 2022-03-01 VITALS — BP 178/100 | HR 88 | Temp 98.3°F | Resp 16 | Ht 74.0 in | Wt 220.3 lb

## 2022-03-01 DIAGNOSIS — R110 Nausea: Secondary | ICD-10-CM | POA: Diagnosis not present

## 2022-03-01 DIAGNOSIS — R1903 Right lower quadrant abdominal swelling, mass and lump: Secondary | ICD-10-CM | POA: Insufficient documentation

## 2022-03-01 DIAGNOSIS — E1165 Type 2 diabetes mellitus with hyperglycemia: Secondary | ICD-10-CM

## 2022-03-01 DIAGNOSIS — K5900 Constipation, unspecified: Secondary | ICD-10-CM | POA: Diagnosis not present

## 2022-03-01 MED ORDER — ONDANSETRON HCL 4 MG PO TABS: 4.0000 mg | ORAL_TABLET | Freq: Three times a day (TID) | ORAL | 0 refills | Status: DC | PRN

## 2022-03-01 NOTE — Progress Notes (Signed)
Acute Office Visit  Subjective:    Patient ID: Alan Holt, male    DOB: [birth year], 71 y.o.   MRN: [id number]  Today's Provider: Talitha Givens, MHS, PA-C Introduced myself to the patient as a PA-C and provided education on APPs in clinical practice.    Chief Complaint  Patient presents with   Constipation    Onset x2 weeks, denies abdominal pain and any bleeding. Pt states also having frequent urination but no pain while urinating.    Constipation Associated symptoms include a fever, nausea and vomiting. Pertinent negatives include no abdominal pain.  Patient is in today for constipation  Reports he is typically very regular.  Has had intermittent bowel movements of various character that have not been his usual Reports feeling nausea and vomiting.  States he has been drinking a lot of water and unsweetened ginger ale.  States he did have a bowel movement this AM - not a normal bowel movement  Has been taking Ducolax last night - reports relief with use  States he came down with URI type symptoms last Tuesday with sore throat, fever (100.1), cough Has performed several COVID tests at home that have been negative Reports recent sick contact with similar URI type symptoms   He is taking his blood glucose at home and reports highest reading was 173   Past Medical History:  Diagnosis Date   Allergy    Diabetes mellitus without complication ([place])    Dysplastic nevus 02/25/2014   L mid ant thigh - mild   Dysplastic nevus 02/25/2014   R med popliteal - mild   Dysplastic nevus 02/25/2014   R med knee - mild   Dysplastic nevus 12/30/2016   R pectoral 2.0 cm med to areola - mod   Dysplastic nevus 12/30/2016   L dorsum prox forearm near antecubital - mod   Elevated BP without diagnosis of hypertension 11/10/2016   Hypertension    Prediabetes 05/10/2018   Sleep apnea    Thyroid disease     Past Surgical History:  Procedure Laterality Date   HERNIA REPAIR     childhood    KNEE SURGERY Left 1993   MOUTH SURGERY  04/05/2018   bone graft, 3 teeth pulled   THYROID SURGERY  2015   TOTAL THYROIDECTOMY Bilateral 07/15/2014   Dr. Kathyrn Sheriff.     Family History  Problem Relation Age of Onset   Diabetes Mother    Alcohol abuse Father    Stroke Father    Diabetes Brother    Heart attack Brother     Social History   Socioeconomic History   Marital status: Married    Spouse name: Not on file   Number of children: Not on file   Years of education: Not on file   Highest education level: Master's degree (e.g., MA, MS, MEng, MEd, MSW, MBA)  Occupational History    Comment: retired Careers information officer  Tobacco Use   Smoking status: Never   Smokeless tobacco: Never  Vaping Use   Vaping Use: Never used  Substance and Sexual Activity   Alcohol use: No    Alcohol/week: 0.0 standard drinks   Drug use: No   Sexual activity: Not Currently  Other Topics Concern   Not on file  Social History Narrative   Not on file   Social Determinants of Health   Financial Resource Strain: Low Risk    Difficulty of Paying Living Expenses: Not hard at all  Food Insecurity: No Food  Insecurity   Worried About Charity fundraiser in the Last Year: Never true   [place] in the Last Year: Never true  Transportation Needs: No Transportation Needs   Lack of Transportation (Medical): No   Lack of Transportation (Non-Medical): No  Physical Activity: Sufficiently Active   Days of Exercise per Week: 5 days   Minutes of Exercise per Session: 60 min  Stress: No Stress Concern Present   Feeling of Stress : Not at all  Social Connections: Moderately Integrated   Frequency of Communication with Friends and Family: More than three times a week   Frequency of Social Gatherings with Friends and Family: Three times a week   Attends Religious Services: More than 4 times per year   Active Member of Clubs or Organizations: No   Attends Archivist Meetings: Never   Marital Status:  Married  Human resources officer Violence: Not At Risk   Fear of Current or Ex-Partner: No   Emotionally Abused: No   Physically Abused: No   Sexually Abused: No    Outpatient Medications Prior to Visit  Medication Sig Dispense Refill   aspirin 81 MG tablet Take 81 mg by mouth daily.     Blood Glucose Monitoring Suppl (ACCU-CHEK GUIDE) w/Device KIT See admin instructions.     calcium citrate (CALCITRATE - DOSED IN MG ELEMENTAL CALCIUM) 950 MG tablet Take 1,200 mg of elemental calcium by mouth daily.     cholecalciferol (VITAMIN D) 1000 units tablet Take 1,000 Units by mouth daily.     Glucose Blood (BLOOD GLUCOSE TEST STRIPS) STRP Use as directed to monitor FSBS once daily prn. Dx: E11.65 100 strip 3   ibuprofen (ADVIL,MOTRIN) 600 MG tablet Take 600 mg by mouth every 8 (eight) hours as needed. Pt takes rarely, 1-2 times per week     Lancets MISC Use as directed to monitor FSBS once daily prn. Dx: E11.65 100 each 3   levothyroxine (SYNTHROID, LEVOTHROID) 150 MCG tablet Take 1 tablet by mouth daily.     Multiple Vitamin (MULTIVITAMIN ADULT PO)      niacin (NIASPAN) 500 MG CR tablet Take 3 tablets (1,500 mg total) by mouth at bedtime. 270 tablet 3   No facility-administered medications prior to visit.    Allergies  Allergen Reactions   Statins     Elevated CK with dark urine   Erythromycin Rash    Review of Systems  Constitutional:  Positive for appetite change, chills and fever. Negative for diaphoresis.  HENT:  Positive for congestion, sinus pressure and sinus pain. Negative for sore throat.   Respiratory:  Positive for cough.   Cardiovascular:  Negative for chest pain and palpitations.  Gastrointestinal:  Positive for constipation, nausea and vomiting. Negative for abdominal pain, anal bleeding and blood in stool.  Genitourinary:  Negative for dysuria.  Musculoskeletal:  Negative for arthralgias, myalgias, neck pain and neck stiffness.  Neurological:  Negative for dizziness,  light-headedness and headaches.      Objective:    Physical Exam Vitals reviewed.  Constitutional:      General: He is awake.     Appearance: Normal appearance. He is well-developed, well-groomed and overweight.  HENT:     Head: Normocephalic and atraumatic.  Cardiovascular:     Rate and Rhythm: Normal rate and regular rhythm.     Pulses: Normal pulses.     Heart sounds: Normal heart sounds.  Pulmonary:     Effort: Pulmonary effort is normal.  Breath sounds: Normal breath sounds. No decreased breath sounds, wheezing, rhonchi or rales.  Abdominal:     General: Abdomen is flat. Bowel sounds are normal. There is no distension.     Palpations: Abdomen is soft. There is mass.     Tenderness: There is no abdominal tenderness.     Hernia: No hernia is present.       Comments: Abdomen is overall soft to palpation with the exception of RLQ mass. No overlying surgical scar or change to percussion in area. Non tender in this area as well.   Musculoskeletal:     Right lower leg: No edema.     Left lower leg: No edema.  Neurological:     Mental Status: He is alert.  Psychiatric:        Attention and Perception: Attention normal.        Mood and Affect: Mood and affect normal.        Speech: Speech normal.        Behavior: Behavior normal. Behavior is cooperative.    BP (!) 178/100    Pulse 88    Temp 98.3 F (36.8 C) (Oral)    Resp 16    Ht '6\' 2"'  (1.88 m)    Wt 220 lb 4.8 oz (99.9 kg)    SpO2 98%    BMI 28.28 kg/m  Wt Readings from Last 3 Encounters:  03/01/22 220 lb 4.8 oz (99.9 kg)  10/26/21 226 lb 4.8 oz (102.6 kg)  04/09/21 232 lb 8 oz (105.5 kg)    Health Maintenance Due  Topic Date Due   FOOT EXAM  Never done   URINE MICROALBUMIN  04/22/2022    There are no preventive care reminders to display for this patient.   No results found for: TSH Lab Results  Component Value Date   WBC 5.3 04/22/2021   HGB 16.1 04/22/2021   HCT 47.3 04/22/2021   MCV 90.6 04/22/2021    PLT 205 04/22/2021   Lab Results  Component Value Date   NA 141 10/26/2021   K 4.3 10/26/2021   CO2 28 10/26/2021   GLUCOSE 141 (H) 10/26/2021   BUN 20 10/26/2021   CREATININE 0.92 10/26/2021   BILITOT 0.6 10/26/2021   ALKPHOS 66 05/03/2017   AST 25 10/26/2021   ALT 34 10/26/2021   PROT 7.0 10/26/2021   ALBUMIN 4.3 05/03/2017   CALCIUM 10.0 10/26/2021   ANIONGAP 11 11/08/2014   EGFR 89 10/26/2021   Lab Results  Component Value Date   CHOL 147 04/22/2021   Lab Results  Component Value Date   HDL 39 (L) 04/22/2021   Lab Results  Component Value Date   LDLCALC 82 04/22/2021   Lab Results  Component Value Date   TRIG 163 (H) 04/22/2021   Lab Results  Component Value Date   CHOLHDL 3.8 04/22/2021   Lab Results  Component Value Date   HGBA1C 6.3 (H) 10/26/2021       Assessment & Plan:    Problem List Items Addressed This Visit       Other   Abdominal mass, RLQ (right lower quadrant)    New problem, incidental finding on exam RLQ abdominal mass, fixed, approx 4 cm x 4 cm  Mass does not change with cough or straining. Underlying defect not palpated on exam  Recommend [place] for evaluation and results to dictate further management        Relevant Orders   US PELVIS LIMITED (TRANSABDOMINAL ONLY)  Other Visit Diagnoses     Constipation, unspecified constipation type    -  Primary Acute, likely secondary to recent illness Patient reports relief with Ducolax - recommend continuing with this for now until regular bowel movements resume Recommend staying well hydrated and eating bland diet to assist with nausea and vomiting.  Follow up as needed for lingering symptoms    Nausea     Acute, new problem Likely secondary to URI illness and constipation Recommend he stay well hydrated and consume bland diet until nausea resolves Will provide Zofran to assist with nausea with goal of improving PO intake Recommend continuing with Ducolax until regular bowel  movements resume as this will likely improve nausea. Follow up as needed for lingering symptoms.     Relevant Medications   ondansetron (ZOFRAN) 4 MG tablet        No follow-ups on file.   I, Kervens Roper E Yojan Paskett, PA-C, have reviewed all documentation for this visit. The documentation on 03/01/22 for the exam, diagnosis, procedures, and orders are all accurate and complete.   Patty Lopezgarcia, [place] MPH [facility]     No orders of the defined types were placed in this encounter.    Jasiel Apachito E Faruq Rosenberger, PA-C

## 2022-03-01 NOTE — Assessment & Plan Note (Addendum)
New problem, incidental finding on exam ?RLQ abdominal mass, fixed, approx 4 cm x 4 cm  ?Mass does not change with cough or straining. Underlying defect not palpated on exam  ?Recommend [place] for evaluation and results to dictate further management  ? ?

## 2022-03-01 NOTE — Telephone Encounter (Signed)
Pt called in to schedule another appt for today and pt was called to be informed that insurance would not cover for him to come back. Pts wife stated he is currently in a lot of pain and this point and was advised to got the ER.  ?

## 2022-03-01 NOTE — Patient Instructions (Addendum)
Based on your described symptoms and the duration of symptoms it is likely that you have a viral upper respiratory infection (often called a "cold") ? ?Symptoms can last for 3-10 days with lingering cough and intermittent symptoms lasting weeks after that. ? ?The goal of treatment at this time is to reduce your symptoms and discomfort  ? ?You can use over the counter medications such as Dayquil/Nyquil, AlkaSeltzer formulations, etc to provide further relief of symptoms according to the manufacturer's instructions  ?If preferred you can use Coricidin to manage your symptoms rather than those medications mentioned above.  ?You can also use nasal saline sprays to help with congestion and nasal irritation.  ? ?I would like you to continue the Ducolax until you are having normal bowel movements again.  ? ?If your symptoms do not improve or become worse in the next 5-7 days please make an apt at the office so we can see you  ?Go to the ER if you begin to have more serious symptoms such as shortness of breath, trouble breathing, loss of consciousness, swelling around the eyes, high fever, severe lasting headaches, vision changes or neck pain/stiffness.  ? ?I have sent in orders for an abdominal ultrasound. The referral team should call you soon to schedule that.  ?

## 2022-03-01 NOTE — Addendum Note (Signed)
Addended by: Talitha Givens on: 03/01/2022 01:18 PM ? ? Modules accepted: Orders ? ?

## 2022-03-01 NOTE — Telephone Encounter (Signed)
Copied from [place] [phone]. Topic: General - Other ?>> Mar 01, 2022 12:08 PM Tessa Lerner A wrote: ?Reason for CRM: The patient has called to request additional orders for lab work  ? ?The patient would like to have their white blood cell count checked ? ?Please contact further when available ?

## 2022-03-01 NOTE — Telephone Encounter (Signed)
?  Chief Complaint: flank pain ?Symptoms: severe L flank pain 10/10 ?Frequency: pt reports since last Tuesday but today gotten worse ?Pertinent Negatives: Patient denies dysuria, pain with urination ?Disposition: '[]'$ ED /'[]'$ Urgent Care (no appt availability in office) / '[x]'$ Appointment(In office/virtual)/ '[]'$  [facility]/ '[]'$ Home Care/ '[]'$ Refused Recommended Disposition /'[]'$ [facility]/ '[]'$  Follow-up with PCP ?Additional Notes: pt had OV this morning but states he didn't have pain like this before. Advised pt to go to ED but he didn't think he could sit there for hours waiting and is wanting lab work done. Advised pt imaging may need to be done and would have to send him for that but scheduled the appt.  ? ? ?Reason for Disposition ? [1] Sudden onset of severe flank pain AND [2] age > 37 years ? ?Answer Assessment - Initial Assessment Questions ?1. LOCATION: "Where does it hurt?" (e.g., left, right) ?    L lower flank  ?2. ONSET: "When did the pain start?" ?    Last Tues  ?3. SEVERITY: "How bad is the pain?" (e.g., Scale 1-10; mild, moderate, or severe) ?  - MILD (1-3): doesn't interfere with normal activities  ?  - MODERATE (4-7): interferes with normal activities or awakens from sleep  ?  - SEVERE (8-10): excruciating pain and patient unable to do normal activities (stays in bed)   ?    10 ?4. PATTERN: "Does the pain come and go, or is it constant?"  ?    Constant  ?6. OTHER SYMPTOMS:  "Do you have any other symptoms?" (e.g., fever, abdominal pain, vomiting, leg weakness, burning with urination, blood in urine) ?    no ? ?Protocols used: Flank Pain-A-AH ? ?

## 2022-03-02 ENCOUNTER — Emergency Department: Payer: Medicare PPO

## 2022-03-02 ENCOUNTER — Inpatient Hospital Stay
Admission: EM | Admit: 2022-03-02 | Discharge: 2022-03-04 | DRG: 684 | Disposition: A | Discharge status: Home / Self Care | Payer: Medicare PPO | Attending: Internal Medicine | Admitting: Internal Medicine

## 2022-03-02 ENCOUNTER — Encounter: Payer: Self-pay | Admitting: Emergency Medicine

## 2022-03-02 DIAGNOSIS — Z811 Family history of alcohol abuse and dependence: Secondary | ICD-10-CM | POA: Diagnosis not present

## 2022-03-02 DIAGNOSIS — Z881 Allergy status to other antibiotic agents status: Secondary | ICD-10-CM

## 2022-03-02 DIAGNOSIS — Z20822 Contact with and (suspected) exposure to covid-19: Secondary | ICD-10-CM | POA: Diagnosis present

## 2022-03-02 DIAGNOSIS — Z888 Allergy status to other drugs, medicaments and biological substances status: Secondary | ICD-10-CM | POA: Diagnosis not present

## 2022-03-02 DIAGNOSIS — R310 Gross hematuria: Secondary | ICD-10-CM | POA: Diagnosis present

## 2022-03-02 DIAGNOSIS — N132 Hydronephrosis with renal and ureteral calculous obstruction: Secondary | ICD-10-CM | POA: Diagnosis present

## 2022-03-02 DIAGNOSIS — Z823 Family history of stroke: Secondary | ICD-10-CM

## 2022-03-02 DIAGNOSIS — E039 Hypothyroidism, unspecified: Secondary | ICD-10-CM | POA: Diagnosis present

## 2022-03-02 DIAGNOSIS — I10 Essential (primary) hypertension: Secondary | ICD-10-CM | POA: Diagnosis present

## 2022-03-02 DIAGNOSIS — N200 Calculus of kidney: Secondary | ICD-10-CM

## 2022-03-02 DIAGNOSIS — N1330 Unspecified hydronephrosis: Secondary | ICD-10-CM

## 2022-03-02 DIAGNOSIS — N3289 Other specified disorders of bladder: Secondary | ICD-10-CM | POA: Diagnosis present

## 2022-03-02 DIAGNOSIS — E785 Hyperlipidemia, unspecified: Secondary | ICD-10-CM | POA: Diagnosis present

## 2022-03-02 DIAGNOSIS — R319 Hematuria, unspecified: Secondary | ICD-10-CM

## 2022-03-02 DIAGNOSIS — Z7982 Long term (current) use of aspirin: Secondary | ICD-10-CM | POA: Diagnosis not present

## 2022-03-02 DIAGNOSIS — N179 Acute kidney failure, unspecified: Secondary | ICD-10-CM | POA: Diagnosis present

## 2022-03-02 DIAGNOSIS — E119 Type 2 diabetes mellitus without complications: Secondary | ICD-10-CM

## 2022-03-02 DIAGNOSIS — R339 Retention of urine, unspecified: Secondary | ICD-10-CM

## 2022-03-02 DIAGNOSIS — N134 Hydroureter: Secondary | ICD-10-CM

## 2022-03-02 DIAGNOSIS — Z79899 Other long term (current) drug therapy: Secondary | ICD-10-CM

## 2022-03-02 DIAGNOSIS — Z79890 Hormone replacement therapy (postmenopausal): Secondary | ICD-10-CM

## 2022-03-02 DIAGNOSIS — E876 Hypokalemia: Secondary | ICD-10-CM

## 2022-03-02 DIAGNOSIS — K5900 Constipation, unspecified: Secondary | ICD-10-CM | POA: Diagnosis present

## 2022-03-02 DIAGNOSIS — N320 Bladder-neck obstruction: Secondary | ICD-10-CM

## 2022-03-02 DIAGNOSIS — R351 Nocturia: Secondary | ICD-10-CM | POA: Diagnosis present

## 2022-03-02 DIAGNOSIS — N400 Enlarged prostate without lower urinary tract symptoms: Secondary | ICD-10-CM

## 2022-03-02 DIAGNOSIS — Z833 Family history of diabetes mellitus: Secondary | ICD-10-CM

## 2022-03-02 DIAGNOSIS — Z8249 Family history of ischemic heart disease and other diseases of the circulatory system: Secondary | ICD-10-CM | POA: Diagnosis not present

## 2022-03-02 LAB — CBC
HCT: 40 % (ref 39.0–52.0)
Hemoglobin: 13.8 g/dL (ref 13.0–17.0)
MCH: 30.5 pg (ref 26.0–34.0)
MCHC: 34.5 g/dL (ref 30.0–36.0)
MCV: 88.5 fL (ref 80.0–100.0)
Platelets: 210 10*3/uL (ref 150–400)
RBC: 4.52 MIL/uL (ref 4.22–5.81)
RDW: 12.6 % (ref 11.5–15.5)
WBC: 7.5 10*3/uL (ref 4.0–10.5)
nRBC: 0 % (ref 0.0–0.2)

## 2022-03-02 LAB — COMPREHENSIVE METABOLIC PANEL
AG Ratio: 1.7 (calc) (ref 1.0–2.5)
ALT: 22 U/L (ref 9–46)
ALT: 26 U/L (ref 0–44)
AST: 19 U/L (ref 10–35)
AST: 22 U/L (ref 15–41)
Albumin: 4.3 g/dL (ref 3.6–5.1)
Albumin: 4.2 g/dL (ref 3.5–5.0)
Alkaline Phosphatase: 64 U/L (ref 38–126)
Alkaline phosphatase (APISO): 67 U/L (ref 35–144)
Anion gap: 8 (ref 5–15)
BUN/Creatinine Ratio: 15 (calc) (ref 6–22)
BUN: 41 mg/dL — ABNORMAL HIGH (ref 7–25)
BUN: 43 mg/dL — ABNORMAL HIGH (ref 8–23)
CO2: 30 mmol/L (ref 20–32)
CO2: 30 mmol/L (ref 22–32)
Calcium: 14.5 mg/dL (ref 8.6–10.3)
Calcium: 13.4 mg/dL (ref 8.9–10.3)
Chloride: 104 mmol/L (ref 98–110)
Chloride: 103 mmol/L (ref 98–111)
Creat: 2.7 mg/dL — ABNORMAL HIGH (ref 0.70–1.28)
Creatinine, Ser: 2.82 mg/dL — ABNORMAL HIGH (ref 0.61–1.24)
GFR, Estimated: 23 mL/min — ABNORMAL LOW (ref >60)
Globulin: 2.5 g/dL (calc) (ref 1.9–3.7)
Glucose, Bld: 196 mg/dL — ABNORMAL HIGH (ref 65–99)
Glucose, Bld: 135 mg/dL — ABNORMAL HIGH (ref 70–99)
Potassium: 3.9 mmol/L (ref 3.5–5.3)
Potassium: 3.4 mmol/L — ABNORMAL LOW (ref 3.5–5.1)
Sodium: 144 mmol/L (ref 135–146)
Sodium: 141 mmol/L (ref 135–145)
Total Bilirubin: 0.6 mg/dL (ref 0.2–1.2)
Total Bilirubin: 0.8 mg/dL (ref 0.3–1.2)
Total Protein: 6.8 g/dL (ref 6.1–8.1)
Total Protein: 7.3 g/dL (ref 6.5–8.1)

## 2022-03-02 LAB — CBC WITH DIFFERENTIAL/PLATELET
Absolute Monocytes: 683 cells/uL (ref 200–950)
Basophils Absolute: 31 cells/uL (ref 0–200)
Basophils Relative: 0.3 %
Eosinophils Absolute: 10 cells/uL — ABNORMAL LOW (ref 15–500)
Eosinophils Relative: 0.1 %
HCT: 41.5 % (ref 38.5–50.0)
Hemoglobin: 14.2 g/dL (ref 13.2–17.1)
Lymphs Abs: 1173 cells/uL (ref 850–3900)
MCH: 30.7 pg (ref 27.0–33.0)
MCHC: 34.2 g/dL (ref 32.0–36.0)
MCV: 89.6 fL (ref 80.0–100.0)
MPV: 9.5 fL (ref 7.5–12.5)
Monocytes Relative: 6.7 %
Neutro Abs: 8303 cells/uL — ABNORMAL HIGH (ref 1500–7800)
Neutrophils Relative %: 81.4 %
Platelets: 228 10*3/uL (ref 140–400)
RBC: 4.63 10*6/uL (ref 4.20–5.80)
RDW: 12.7 % (ref 11.0–15.0)
Total Lymphocyte: 11.5 %
WBC: 10.2 10*3/uL (ref 3.8–10.8)

## 2022-03-02 LAB — URINALYSIS, COMPLETE (UACMP) WITH MICROSCOPIC
Bacteria, UA: NONE SEEN
Bilirubin Urine: NEGATIVE
Glucose, UA: NEGATIVE mg/dL
Hgb urine dipstick: NEGATIVE
Ketones, ur: NEGATIVE mg/dL
Leukocytes,Ua: NEGATIVE
Nitrite: NEGATIVE
Protein, ur: NEGATIVE mg/dL
Specific Gravity, Urine: 1.008 (ref 1.005–1.030)
Squamous Epithelial / HPF: NONE SEEN (ref 0–5)
pH: 6 (ref 5.0–8.0)

## 2022-03-02 LAB — HEMOGLOBIN A1C
Hgb A1c MFr Bld: 6.4 % of total Hgb — ABNORMAL HIGH (ref <5.7)
Mean Plasma Glucose: 137 mg/dL
eAG (mmol/L): 7.6 mmol/L

## 2022-03-02 LAB — RESP PANEL BY RT-PCR (FLU A&B, COVID) ARPGX2
Influenza A by PCR: NEGATIVE
Influenza B by PCR: NEGATIVE
SARS Coronavirus 2 by RT PCR: NEGATIVE

## 2022-03-02 LAB — LIPASE, BLOOD: Lipase: 28 U/L (ref 11–51)

## 2022-03-02 MED ORDER — LACTATED RINGERS IV SOLN: INTRAVENOUS | Status: DC

## 2022-03-02 MED ORDER — HYDRALAZINE HCL 20 MG/ML IJ SOLN
10.0000 mg | Freq: Four times a day (QID) | INTRAMUSCULAR | Status: DC | PRN
  Administered 2022-03-02: 10 mg via INTRAVENOUS
  Filled 2022-03-02: qty 1

## 2022-03-02 MED ORDER — TAMSULOSIN HCL 0.4 MG PO CAPS
0.4000 mg | ORAL_CAPSULE | Freq: Every day | ORAL | Status: DC
  Administered 2022-03-02 – 2022-03-04 (×3): 0.4 mg via ORAL
  Filled 2022-03-02 (×3): qty 1

## 2022-03-02 MED ORDER — ENOXAPARIN SODIUM 40 MG/0.4ML IJ SOSY
40.0000 mg | PREFILLED_SYRINGE | INTRAMUSCULAR | Status: DC
  Administered 2022-03-02 – 2022-03-03 (×2): 40 mg via SUBCUTANEOUS
  Filled 2022-03-02 (×2): qty 0.4

## 2022-03-02 MED ORDER — POLYETHYLENE GLYCOL 3350 17 G PO PACK
17.0000 g | PACK | Freq: Two times a day (BID) | ORAL | Status: DC | PRN
  Administered 2022-03-04: 08:00:00 17 g via ORAL
  Filled 2022-03-02: qty 1

## 2022-03-02 MED ORDER — LACTATED RINGERS IV BOLUS
1000.0000 mL | Freq: Once | INTRAVENOUS | Status: AC
  Administered 2022-03-02: 1000 mL via INTRAVENOUS

## 2022-03-02 MED ORDER — FINASTERIDE 5 MG PO TABS
5.0000 mg | ORAL_TABLET | Freq: Every day | ORAL | Status: DC
  Administered 2022-03-02 – 2022-03-04 (×3): 5 mg via ORAL
  Filled 2022-03-02 (×4): qty 1

## 2022-03-02 MED ORDER — DOCUSATE SODIUM 100 MG PO CAPS: 100.0000 mg | ORAL_CAPSULE | Freq: Two times a day (BID) | ORAL | Status: DC | PRN

## 2022-03-02 MED ORDER — ACETAMINOPHEN 325 MG PO TABS
650.0000 mg | ORAL_TABLET | ORAL | Status: DC | PRN
  Administered 2022-03-02: 650 mg via ORAL
  Filled 2022-03-02: qty 2

## 2022-03-02 MED ORDER — KETOROLAC TROMETHAMINE 30 MG/ML IJ SOLN
30.0000 mg | Freq: Once | INTRAMUSCULAR | Status: AC
  Administered 2022-03-02: 30 mg via INTRAVENOUS
  Filled 2022-03-02: qty 1

## 2022-03-02 MED ORDER — LEVOTHYROXINE SODIUM 50 MCG PO TABS
150.0000 ug | ORAL_TABLET | Freq: Every day | ORAL | Status: DC
  Administered 2022-03-03 – 2022-03-04 (×2): 150 ug via ORAL
  Filled 2022-03-02 (×2): qty 1

## 2022-03-02 NOTE — Assessment & Plan Note (Addendum)
Due to relief of bladder outlet obstruction as per urology input. ?Outpatient follow-up with urology ?

## 2022-03-02 NOTE — Assessment & Plan Note (Addendum)
Continue Synthroid °

## 2022-03-02 NOTE — ED Triage Notes (Signed)
C/O constipation, urinary frequency, vomiting and fever x 10 days.  C/O left flank pain. ?

## 2022-03-02 NOTE — ED Provider Notes (Signed)
? ?[facility] ?Provider Note ? ? ? Event Date/Time  ? First MD Initiated Contact with Patient 03/02/22 [phone]   ?  (approximate) ? ? ?History  ? ?Flank pain, nausea vomiting ? ? ?HPI ? ?Alan Holt is a 71 y.o. male with a history of diabetes, hyperlipidemia, hypertension who presents with complaints of left-sided back pain and some flank pain with nausea and vomiting.  Patient reports pain started about a week ago, has worsened in the last several days.  He reports it is more comfortable for him to move around but becomes worse when he sits still.  He reports urinary frequency.  Denies hematuria.  No history of kidney stones.  No injury to the area.  No history of abdominal surgery. ? ?  ? ? ?Physical Exam  ? ?Triage Vital Signs: ?ED Triage Vitals  ?Enc Vitals Group  ?   BP 03/02/22 0738 (!) 175/91  ?   Pulse Rate 03/02/22 0738 72  ?   Resp 03/02/22 0738 16  ?   Temp 03/02/22 0738 98.6 ?F (37 ?C)  ?   Temp Source 03/02/22 0738 Oral  ?   SpO2 03/02/22 0738 97 %  ?   Weight 03/02/22 0737 99.9 kg (220 lb 3.8 oz)  ?   Height 03/02/22 0737 1.88 m ('6\' 2"'$ )  ?   Head Circumference --   ?   Peak Flow --   ?   Pain Score 03/02/22 0737 1  ?   Pain Loc --   ?   Pain Edu? --   ?   Excl. in [place]? --   ? ? ?Most recent vital signs: ?Vitals:  ? 03/02/22 0738  ?BP: (!) 175/91  ?Pulse: 72  ?Resp: 16  ?Temp: 98.6 ?F (37 ?C)  ?SpO2: 97%  ? ? ? ?General: Awake, no distress.  ?CV:  Good peripheral perfusion.  ?Resp:  Normal effort.  ?Abd:  No distention.  Soft, nontender, no CVA tenderness ? ? ? ?ED Results / Procedures / Treatments  ? ?Labs ?(all labs ordered are listed, but only abnormal results are displayed) ?Labs Reviewed  ?COMPREHENSIVE METABOLIC PANEL - Abnormal; Notable for the following components:  ?    Result Value  ? Potassium 3.4 (*)   ? Glucose, Bld 135 (*)   ? BUN 43 (*)   ? Creatinine, Ser 2.82 (*)   ? Calcium 13.4 (*)   ? GFR, Estimated 23 (*)   ? All other components within normal limits   ?URINALYSIS, COMPLETE (UACMP) WITH MICROSCOPIC - Abnormal; Notable for the following components:  ? Color, Urine YELLOW (*)   ? APPearance HAZY (*)   ? All other components within normal limits  ?RESP PANEL BY RT-PCR (FLU A&B, COVID) [id number]  ?CBC  ?LIPASE, BLOOD  ? ? ? ?EKG ? ? ? ? ?RADIOLOGY ? ?CT abdomen pelvis reviewed by me with evidence of bilateral hydronephrosis, pending radiology read ? ? ?PROCEDURES: ? ?Critical Care performed: yes ? ?CRITICAL CARE ?Performed by: Lavonia Drafts ? ? ?Total critical care time: 30 minutes ? ?Critical care time was exclusive of separately billable procedures and treating other patients. ? ?Critical care was necessary to treat or prevent imminent or life-threatening deterioration. ? ?Critical care was time spent personally by me on the following activities: development of treatment plan with patient and/or surrogate as well as nursing, discussions with consultants, evaluation of patient's response to treatment, examination of patient, obtaining history from patient or surrogate, ordering and  performing treatments and interventions, ordering and review of laboratory studies, ordering and review of radiographic studies, pulse oximetry and re-evaluation of patient's condition. ? ? ?Procedures ? ? ?MEDICATIONS ORDERED IN ED: ?Medications  ?ketorolac (TORADOL) 30 MG/ML injection 30 mg (30 mg Intravenous Given 03/02/22 0828)  ? ? ? ?IMPRESSION / MDM / ASSESSMENT AND PLAN / ED COURSE  ?I reviewed the triage vital signs and the nursing notes. ? ?Patient presents with back pain, some flank pain as detailed above.  Suspicious for ureterolithiasis, given history of diabetes also concern for urinary tract infection/[place]. ? ?We will obtain labs, urinalysis, place IV, give IV Toradol, obtain CT abdomen pelvis ? ?Lab work is notable for elevated calcium of 13.4, elevated creatinine consistent with acute kidney injury, likely related to obstruction seen on CT abdomen pelvis. ? ?We will place  Foley catheter to drain distended bladder, start IV fluids for hypercalcemia and AKI and admit to the hospitalist service, ? ?Have consulted Dr. Bernardo Heater of urology, pending callback ? ? ? ? ?  ? ? ?FINAL CLINICAL IMPRESSION(S) / ED DIAGNOSES  ? ?Final diagnoses:  ?Hypercalcemia  ?Bladder outlet obstruction  ?Acute kidney injury ([place])  ? ? ? ?Rx / DC Orders  ? ?ED Discharge Orders   ? ?      Ordered  ?  Insert foley catheter       ? 03/02/22 0855  ? ?  ?  ? ?  ? ? ? ?Note:  This document was prepared using Dragon voice recognition software and may include unintentional dictation errors. ?  ?Lavonia Drafts, MD ?03/02/22 [phone] ? ?

## 2022-03-02 NOTE — Assessment & Plan Note (Addendum)
A1c 6.4 suggest good outpatient control. ?Not checking CBG or treating while hospitalized ?

## 2022-03-02 NOTE — Assessment & Plan Note (Addendum)
Cr 2.7 on admission (baseline 0.9 back in Oct 2022), due bladder outlet obstruction/obstructive uropathy ?S/p Foley catheter placement ?Creatinine down to 1.8 on day of discharge. ?Had 6.28 L urine output in the 24 hours 3/7 - 3/8.  Urine output has dropped but still good, down to 3600 in the last 24 hours.  Patient able to keep up with adequate oral intake which has been repeatedly and clearly been advised to him and his wife at bedside who verbalized understanding.  ?S/p IV fluids, discontinued. ?Encouraged continued liberal oral fluid intake, avoid nephrotoxic agents, discontinued as needed Motrin from his home med list, patient has already made a follow-up appointment with his PCP on Monday 3/13 for repeat labs and follow-up. ?

## 2022-03-02 NOTE — Assessment & Plan Note (Addendum)
CT a/p showed enlarged prostate gland.  Pt had not been on medication for BPH. ?Started Flomax and Proscar ?Outpatient urology follow-up. ?

## 2022-03-02 NOTE — Assessment & Plan Note (Addendum)
2/2 bladder outlet obstruction.   ?Foley inserted in the ED with 2L urine return ?Management of Foley catheter and acute urinary retention as noted above. ?

## 2022-03-02 NOTE — Assessment & Plan Note (Addendum)
Ca 14.5 on presentation. ?Likely related to acute kidney injury.  Serum calcium has normalized. ?Initiated IV fluids. ?Discontinued calcium supplements for now and can be reevaluated during outpatient follow-up.  Patient does not take vitamin D supplements. ?

## 2022-03-02 NOTE — H&P (Signed)
History and Physical    Alan Holt OEV:[id number] DOB: [birth year] DOA: 03/02/2022  PCP: Teodora Medici, DO  Patient coming from: home  I have personally briefly reviewed patient's old medical records in [place]  Chief Complaint: left flank pain  HPI: Alan Holt is a 71 y.o. male with medical history significant of DM2 and hypothyroidism who presented with left flank pain, N/V, and constipation.  Pt presented mainly for left flank pain that started about a week ago but got worse.  Also complained of N/V.  Had a temp to 100.1 at home.  Pt reported urinary frequency that also worsened to the point that he had to get up 5 times at night to void, but small amounts.  Denied dysuria or bladder pain.  No dyspnea, chest pain, abdominal pain, increased swelling.  Also complained of constipation.  No prior dx of BPH, but wife said they were aware that he had a large prostate.    ED Course: initial vitals: afebrile, pulse 88, BP 178/100, RR 16, sating 98% on room air.  Labs notable for Cr 2.7 (baseline 0.9 back in Oct 2022), Ca 14.5, normal WBC, UA neg for infection, CT a/p showed Severe bladder distension with mild irregular bladder wall thickening, suggestive of chronic bladder outlet obstruction. Severe bilateral hydroureteronephrosis.  Foley placed in the ED with 2L urine return.     Assessment/Plan Principal Problem:   AKI (acute kidney injury) ([place])  # AKI, POA --Cr 2.7 (baseline 0.9 back in Oct 2022), due to post-renal bladder outlet obstruction.   Plan: --1L LR f/b MIVF_0  ml/hr for 24 hours to match post-obstruction diuresis  # Severe bilateral hydroureteronephrosis # Severe bladder distension --2/2 bladder outlet obstruction.   --Foley inserted in the ED with 2L urine return Plan: --continue Foley for bladder decompression --will likely need to discharge with Foley and follow up with outpatient urology since bladder outlet obstruction appears chronic  # BPH --CT  a/p showed enlarged prostate gland.  Pt had not been on medication for BPH. Plan: --start Flomax and Proscar --outpatient f/u with urology  # Hypercalcemia --Ca 14.5 on presentation.   Plan: --will hold off on treating for now and see if Ca level will improve with treatment of AKI and bladder outlet obstruction. --monitor Ca daily  # DM2, well controlled --A1c 6.4.  Pt was not on hypoglycemics at home. --no need for BG checks and SSI  # HTN --BP 170's on presentation.  Not on hypertensives at home.  Maybe exacerbated by urinary retention. Plan: --IV hydralazine PRN  --will hold off starting scheduled BP meds for now  # Hematuria --UA neg for infection.  Hematuria likely due to severe hydronephrosis.  Hgb wnl. Should resolve with release of pressure off of the kidneys.  # Hypothyroidism --cont home synthroid  # Punctate nonobstructing right renal calculus   DVT prophylaxis: Lovenox SQ Code Status: Full code  Family Communication: wife updated at bedside today  Disposition Plan: home  Consults called: none Level of care: Med-Surg   Review of Systems: As per HPI otherwise complete review of systems negative.   Past Medical History:  Diagnosis Date   Allergy    Diabetes mellitus without complication ([place])    Dysplastic nevus 02/25/2014   L mid ant thigh - mild   Dysplastic nevus 02/25/2014   R med popliteal - mild   Dysplastic nevus 02/25/2014   R med knee - mild   Dysplastic nevus 12/30/2016   R pectoral 2.0  cm med to areola - mod   Dysplastic nevus 12/30/2016   L dorsum prox forearm near antecubital - mod   Elevated BP without diagnosis of hypertension 11/10/2016   Hypertension    Prediabetes 05/10/2018   Sleep apnea    Thyroid disease     Past Surgical History:  Procedure Laterality Date   HERNIA REPAIR     childhood   KNEE SURGERY Left 1993   MOUTH SURGERY  04/05/2018   bone graft, 3 teeth pulled   THYROID SURGERY  2015   TOTAL THYROIDECTOMY  Bilateral 07/15/2014   Dr. Kathyrn Sheriff.      reports that he has never smoked. He has never used smokeless tobacco. He reports that he does not drink alcohol and does not use drugs.  Allergies  Allergen Reactions   Statins     Elevated CK with dark urine   Erythromycin Rash    Family History  Problem Relation Age of Onset   Diabetes Mother    Alcohol abuse Father    Stroke Father    Diabetes Brother    Heart attack Brother     Prior to Admission medications   Medication Sig Start Date End Date Taking? Authorizing Provider  aspirin 81 MG tablet Take 81 mg by mouth daily.    [provider]  Blood Glucose Monitoring Suppl (ACCU-CHEK GUIDE) w/Device KIT See admin instructions. 02/06/21   [provider]  calcium citrate (CALCITRATE - DOSED IN MG ELEMENTAL CALCIUM) 950 MG tablet Take 1,200 mg of elemental calcium by mouth daily.    [provider]  cholecalciferol (VITAMIN D) 1000 units tablet Take 1,000 Units by mouth daily.    [provider]  Glucose Blood (BLOOD GLUCOSE TEST STRIPS) STRP Use as directed to monitor FSBS once daily prn. Dx: E11.65 02/06/21   Delsa Grana, PA-C  ibuprofen (ADVIL,MOTRIN) 600 MG tablet Take 600 mg by mouth every 8 (eight) hours as needed. Pt takes rarely, 1-2 times per week 08/05/14   [provider]  Lancets MISC Use as directed to monitor FSBS once daily prn. Dx: E11.65 02/06/21   Delsa Grana, PA-C  levothyroxine (SYNTHROID, LEVOTHROID) 150 MCG tablet Take 1 tablet by mouth daily. 09/20/17   [provider]  Multiple Vitamin (MULTIVITAMIN ADULT PO)  09/24/21   [provider]  niacin (NIASPAN) 500 MG CR tablet Take 3 tablets (1,500 mg total) by mouth at bedtime. 07/14/21   Delsa Grana, PA-C  ondansetron (ZOFRAN) 4 MG tablet Take 1 tablet (4 mg total) by mouth every 8 (eight) hours as needed for nausea or vomiting. 03/01/22   Mecum, Dani Gobble, PA-C    Physical Exam: Vitals:   03/02/22 0737 03/02/22  0738  BP:  (!) 175/91  Pulse:  72  Resp:  16  Temp:  98.6 F (37 C)  TempSrc:  Oral  SpO2:  97%  Weight: 99.9 kg   Height: _0  (1.88 m)     Constitutional: NAD, AAOx3 HEENT: conjunctivae and lids normal, EOMI CV: No cyanosis.   RESP: normal respiratory effort, on RA Extremities: No effusions, edema in BLE SKIN: warm, dry Neuro: II - XII grossly intact.   Psych: Normal mood and affect.  Appropriate judgement and reason  Foley present with red urine   Labs on Admission: I have personally reviewed following labs and imaging studies  CBC: Recent Labs  Lab 03/01/22 1342 03/02/22 0813  WBC 10.2 7.5  NEUTROABS 8,303*  --   HGB 14.2 13.8  HCT 41.5 40.0  MCV 89.6 88.5  PLT 228 256   Basic Metabolic Panel: Recent Labs  Lab 03/01/22 1342 03/02/22 0813  NA 144 141  K 3.9 3.4*  CL 104 103  CO2 30 30  GLUCOSE 196* 135*  BUN 41* 43*  CREATININE 2.70* 2.82*  CALCIUM 14.5* 13.4*   GFR: Estimated Creatinine Clearance: 30.3 mL/min (A) (by C-G formula based on SCr of 2.82 mg/dL (H)). Liver Function Tests: Recent Labs  Lab 03/01/22 1342 03/02/22 0813  AST 19 22  ALT 22 26  ALKPHOS  --  64  BILITOT 0.6 0.8  PROT 6.8 7.3  ALBUMIN  --  4.2   Recent Labs  Lab 03/02/22 0813  LIPASE 28   No results for input(s): AMMONIA in the last 168 hours. Coagulation Profile: No results for input(s): INR, PROTIME in the last 168 hours. Cardiac Enzymes: No results for input(s): CKTOTAL, CKMB, CKMBINDEX, TROPONINI in the last 168 hours. BNP (last 3 results) No results for input(s): PROBNP in the last 8760 hours. HbA1C: Recent Labs    03/01/22 1342  HGBA1C 6.4*   CBG: No results for input(s): GLUCAP in the last 168 hours. Lipid Profile: No results for input(s): CHOL, HDL, LDLCALC, TRIG, CHOLHDL, LDLDIRECT in the last 72 hours. Thyroid Function Tests: No results for input(s): TSH, T4TOTAL, FREET4, T3FREE, THYROIDAB in the last 72 hours. Anemia Panel: No results for  input(s): VITAMINB12, FOLATE, FERRITIN, TIBC, IRON, RETICCTPCT in the last 72 hours. Urine analysis:    Component Value Date/Time   COLORURINE YELLOW (A) 03/02/2022 0813   APPEARANCEUR HAZY (A) 03/02/2022 0813   LABSPEC 1.008 03/02/2022 0813   PHURINE 6.0 03/02/2022 0813   GLUCOSEU NEGATIVE 03/02/2022 0813   HGBUR NEGATIVE 03/02/2022 0813   BILIRUBINUR NEGATIVE 03/02/2022 0813   BILIRUBINUR neg 10/26/2021 1000   KETONESUR NEGATIVE 03/02/2022 0813   PROTEINUR NEGATIVE 03/02/2022 0813   UROBILINOGEN 0.2 10/26/2021 1000   NITRITE NEGATIVE 03/02/2022 0813   LEUKOCYTESUR NEGATIVE 03/02/2022 0813    Radiological Exams on Admission: CT ABDOMEN PELVIS WO CONTRAST  Result Date: 03/02/2022 CLINICAL DATA:  Flank pain.  Urinary frequency.  Fever for 10 days. EXAM: CT ABDOMEN AND PELVIS WITHOUT CONTRAST TECHNIQUE: Multidetector CT imaging of the abdomen and pelvis was performed following the standard protocol without IV contrast. RADIATION DOSE REDUCTION: This exam was performed according to the departmental dose-optimization program which includes automated exposure control, adjustment of the mA and/or kV according to patient size and/or use of iterative reconstruction technique. COMPARISON:  None. FINDINGS: Lower chest: No acute abnormality. Hepatobiliary: No focal liver abnormality is seen. No gallstones, gallbladder wall thickening, or biliary dilatation. Pancreas: Unremarkable. No pancreatic ductal dilatation or surrounding inflammatory changes. Spleen: Normal in size without focal abnormality. Adrenals/Urinary Tract: Adrenal glands are unremarkable. Punctate nonobstructing right renal calculus. Small right renal cyst measuring 10 mm along the posterior interpolar aspect. Severe bilateral hydroureteronephrosis. Severe bladder distension with mild irregular bladder wall thickening. Small anterior bladder diverticulum. Stomach/Bowel: Stomach is within normal limits. No evidence of bowel wall thickening,  distention, or inflammatory changes. Vascular/Lymphatic: No significant vascular findings are present. No enlarged abdominal or pelvic lymph nodes. Reproductive: Enlarged prostate gland. Other: No abdominal wall hernia or abnormality. No abdominopelvic ascites. Musculoskeletal: No acute osseous abnormality. No aggressive osseous lesion. Mild osteoarthritis of bilateral SI joints. Mild degenerative disease with disc height loss at L2-3, L3-4, L4-5 and L5-S1 with bilateral facet arthropathy. IMPRESSION: 1. Severe bladder distension with mild irregular bladder wall thickening, suggestive  of chronic bladder outlet obstruction. Severe bilateral hydroureteronephrosis, likely secondary to bladder outlet obstruction. 2. Punctate nonobstructing right renal calculus. 3. Enlarged prostate gland. Electronically Signed   By: Kathreen Devoid M.D.   On: 03/02/2022 08:30      Enzo Bi MD [facility]  If 7PM-7AM, please contact night-coverage 03/02/2022, 9:17 AM

## 2022-03-02 NOTE — Assessment & Plan Note (Addendum)
BP 170's on presentation.  Not on hypertensives at home.  Maybe exacerbated by urinary retention. ?Mild intermittent elevated blood pressures. ?Close outpatient follow-up to determine if needs to initiate maintenance antihypertensives ?

## 2022-03-02 NOTE — Consult Note (Signed)
Urology Consult  Requesting physician: Lavonia Drafts, MD  Reason for consultation: Bilateral hydronephrosis with severe bladder distention   History of Present Illness: Alan Holt is a 71 y.o. male who presented to the ED this morning with a 1 week history of left back/flank pain associated with nausea and vomiting.  His pain had worsened in the last few days precipitating the ED visit.  He had recently noticed urinary frequency and nocturia.  No prior history of urologic problems though has been told in the past that his prostate was enlarged.  He has had no BPH treatment.  Creatinine was 2.82.  CT abdomen pelvis without contrast was performed which showed bilateral hydronephrosis/hydroureter and a severely distended bladder.  Foley catheter was placed with return of 2 L of urine.  He has been started on tamsulosin.  Past Medical History:  Diagnosis Date   Allergy    Diabetes mellitus without complication ([place])    Dysplastic nevus 02/25/2014   L mid ant thigh - mild   Dysplastic nevus 02/25/2014   R med popliteal - mild   Dysplastic nevus 02/25/2014   R med knee - mild   Dysplastic nevus 12/30/2016   R pectoral 2.0 cm med to areola - mod   Dysplastic nevus 12/30/2016   L dorsum prox forearm near antecubital - mod   Elevated BP without diagnosis of hypertension 11/10/2016   Hypertension    Prediabetes 05/10/2018   Sleep apnea    Thyroid disease     Past Surgical History:  Procedure Laterality Date   HERNIA REPAIR     childhood   KNEE SURGERY Left 1993   MOUTH SURGERY  04/05/2018   bone graft, 3 teeth pulled   THYROID SURGERY  2015   TOTAL THYROIDECTOMY Bilateral 07/15/2014   Dr. Kathyrn Sheriff.     Home Medications:  Current Meds  Medication Sig   aspirin 81 MG tablet Take 81 mg by mouth daily.   calcium citrate (CALCITRATE - DOSED IN MG ELEMENTAL CALCIUM) 950 MG tablet Take 1,200 mg of elemental calcium by mouth daily.   levothyroxine (SYNTHROID, LEVOTHROID) 150 MCG  tablet Take 1 tablet by mouth daily.   Multiple Vitamin (MULTIVITAMIN ADULT PO) Take 1 tablet by mouth daily.   niacin (NIASPAN) 500 MG CR tablet Take 3 tablets (1,500 mg total) by mouth at bedtime.    Allergies:  Allergies  Allergen Reactions   Statins     Elevated CK with dark urine   Erythromycin Rash    Family History  Problem Relation Age of Onset   Diabetes Mother    Alcohol abuse Father    Stroke Father    Diabetes Brother    Heart attack Brother     Social History:  reports that he has never smoked. He has never used smokeless tobacco. He reports that he does not drink alcohol and does not use drugs.  ROS: A complete review of systems was performed.  All systems are negative except for pertinent findings as noted.  Physical Exam:  Vital signs in last 24 hours: Temp:  [98 F (36.7 C)-98.9 F (37.2 C)] 98 F (36.7 C) (03/07 2008) Pulse Rate:  [64-72] 69 (03/07 2008) Resp:  [16-20] 18 (03/07 2008) BP: (160-178)/(80-92) 178/92 (03/07 1928) SpO2:  [97 %-100 %] 98 % (03/07 2008) Weight:  [99.9 kg] 99.9 kg (03/07 0737) Constitutional:  Alert and oriented, No acute distress HEENT:  AT, moist mucus membranes.  Trachea midline, no masses Cardiovascular: Regular rate  and rhythm, no clubbing, cyanosis, or edema. Respiratory: Normal respiratory effort, lungs clear bilaterally GU: Foley catheter draining moderate bloody urine without clots Skin: No rashes, bruises or suspicious lesions Lymph: No cervical or inguinal adenopathy Neurologic: Grossly intact, no focal deficits, moving all 4 extremities Psychiatric: Normal mood and affect   Laboratory Data:  Recent Labs    03/01/22 1342 03/02/22 0813  WBC 10.2 7.5  HGB 14.2 13.8  HCT 41.5 40.0   Recent Labs    03/01/22 1342 03/02/22 0813  NA 144 141  K 3.9 3.4*  CL 104 103  CO2 30 30  GLUCOSE 196* 135*  BUN 41* 43*  CREATININE 2.70* 2.82*  CALCIUM 14.5* 13.4*   No results for input(s): LABPT, INR in the  last 72 hours. No results for input(s): LABURIN in the last 72 hours. Results for orders placed or performed during the hospital encounter of 03/02/22  Resp Panel by RT-PCR (Flu A&B, Covid) Nasopharyngeal Swab     Status: None   Collection Time: 03/02/22  9:46 AM   Specimen: Nasopharyngeal Swab; Nasopharyngeal(NP) swabs in vial transport medium  Result Value Ref Range Status   SARS Coronavirus 2 by RT PCR NEGATIVE NEGATIVE Final    Comment: (NOTE) SARS-CoV-2 target nucleic acids are NOT DETECTED.  The SARS-CoV-2 RNA is generally detectable in upper respiratory specimens during the acute phase of infection. The lowest concentration of SARS-CoV-2 viral copies this assay can detect is 138 copies/mL. A negative result does not preclude SARS-Cov-2 infection and should not be used as the sole basis for treatment or other patient management decisions. A negative result may occur with  improper specimen collection/handling, submission of specimen other than nasopharyngeal swab, presence of viral mutation(s) within the areas targeted by this assay, and inadequate number of viral copies(<138 copies/mL). A negative result must be combined with clinical observations, patient history, and epidemiological information. The expected result is Negative.  Fact Sheet for Patients:  EntrepreneurPulse.com.au  Fact Sheet for Healthcare Providers:  IncredibleEmployment.be  This test is no t yet approved or cleared by the [place] FDA and  has been authorized for detection and/or diagnosis of SARS-CoV-2 by FDA under an Emergency Use Authorization (EUA). This EUA will remain  in effect (meaning this test can be used) for the duration of the COVID-19 declaration under Section 564(b)(1) of the Act, 21 U.S.C.section 360bbb-3(b)(1), unless the authorization is terminated  or revoked sooner.       Influenza A by PCR NEGATIVE NEGATIVE Final   Influenza B by PCR  NEGATIVE NEGATIVE Final    Comment: (NOTE) The Xpert Xpress SARS-CoV-2/FLU/RSV plus assay is intended as an aid in the diagnosis of influenza from Nasopharyngeal swab specimens and should not be used as a sole basis for treatment. Nasal washings and aspirates are unacceptable for Xpert Xpress SARS-CoV-2/FLU/RSV testing.  Fact Sheet for Patients: EntrepreneurPulse.com.au  Fact Sheet for Healthcare Providers: IncredibleEmployment.be  This test is not yet approved or cleared by the [place] FDA and has been authorized for detection and/or diagnosis of SARS-CoV-2 by FDA under an Emergency Use Authorization (EUA). This EUA will remain in effect (meaning this test can be used) for the duration of the COVID-19 declaration under Section 564(b)(1) of the Act, 21 U.S.C. section 360bbb-3(b)(1), unless the authorization is terminated or revoked.  Performed at [facility], [address], [place], [place] [address]      Radiologic Imaging: CT images were personally reviewed and interpreted.  Severe bladder distention with the  dome of the bladder located near the inferior border of the L2 transverse process  CT ABDOMEN PELVIS WO CONTRAST  Result Date: 03/02/2022 CLINICAL DATA:  Flank pain.  Urinary frequency.  Fever for 10 days. EXAM: CT ABDOMEN AND PELVIS WITHOUT CONTRAST TECHNIQUE: Multidetector CT imaging of the abdomen and pelvis was performed following the standard protocol without IV contrast. RADIATION DOSE REDUCTION: This exam was performed according to the departmental dose-optimization program which includes automated exposure control, adjustment of the mA and/or kV according to patient size and/or use of iterative reconstruction technique. COMPARISON:  None. FINDINGS: Lower chest: No acute abnormality. Hepatobiliary: No focal liver abnormality is seen. No gallstones, gallbladder wall thickening, or biliary dilatation. Pancreas:  Unremarkable. No pancreatic ductal dilatation or surrounding inflammatory changes. Spleen: Normal in size without focal abnormality. Adrenals/Urinary Tract: Adrenal glands are unremarkable. Punctate nonobstructing right renal calculus. Small right renal cyst measuring 10 mm along the posterior interpolar aspect. Severe bilateral hydroureteronephrosis. Severe bladder distension with mild irregular bladder wall thickening. Small anterior bladder diverticulum. Stomach/Bowel: Stomach is within normal limits. No evidence of bowel wall thickening, distention, or inflammatory changes. Vascular/Lymphatic: No significant vascular findings are present. No enlarged abdominal or pelvic lymph nodes. Reproductive: Enlarged prostate gland. Other: No abdominal wall hernia or abnormality. No abdominopelvic ascites. Musculoskeletal: No acute osseous abnormality. No aggressive osseous lesion. Mild osteoarthritis of bilateral SI joints. Mild degenerative disease with disc height loss at L2-3, L3-4, L4-5 and L5-S1 with bilateral facet arthropathy. IMPRESSION: 1. Severe bladder distension with mild irregular bladder wall thickening, suggestive of chronic bladder outlet obstruction. Severe bilateral hydroureteronephrosis, likely secondary to bladder outlet obstruction. 2. Punctate nonobstructing right renal calculus. 3. Enlarged prostate gland. Electronically Signed   By: Kathreen Devoid M.D.   On: 03/02/2022 08:30    Impression/Assessment:   1.  Urinary retention Severe bladder distention indicative of chronic outlet obstruction  2.  Bilateral hydronephrosis/hydroureter Secondary to above  3.  Acute kidney injury Secondary to above  4.  Gross hematuria Secondary to relief of severe bladder distention  Recommendation:  Follow creatinine/GFR Follow-up renal ultrasound 03/04/2022 Continue tamsulosin Indwelling Foley catheter x10-14 days Office follow-up after discharge for voiding trial   03/02/2022, 8:11 PM  John Giovanni,  MD

## 2022-03-03 ENCOUNTER — Inpatient Hospital Stay: Payer: Medicare PPO

## 2022-03-03 ENCOUNTER — Other Ambulatory Visit: Payer: Medicare PPO

## 2022-03-03 DIAGNOSIS — N320 Bladder-neck obstruction: Secondary | ICD-10-CM

## 2022-03-03 DIAGNOSIS — E876 Hypokalemia: Secondary | ICD-10-CM

## 2022-03-03 LAB — BASIC METABOLIC PANEL
Anion gap: 9 (ref 5–15)
BUN: 36 mg/dL — ABNORMAL HIGH (ref 8–23)
CO2: 27 mmol/L (ref 22–32)
Calcium: 11.3 mg/dL — ABNORMAL HIGH (ref 8.9–10.3)
Chloride: 105 mmol/L (ref 98–111)
Creatinine, Ser: 1.96 mg/dL — ABNORMAL HIGH (ref 0.61–1.24)
GFR, Estimated: 36 mL/min — ABNORMAL LOW (ref >60)
Glucose, Bld: 136 mg/dL — ABNORMAL HIGH (ref 70–99)
Potassium: 3.3 mmol/L — ABNORMAL LOW (ref 3.5–5.1)
Sodium: 141 mmol/L (ref 135–145)

## 2022-03-03 LAB — CBC
HCT: 36.8 % — ABNORMAL LOW (ref 39.0–52.0)
Hemoglobin: 12.8 g/dL — ABNORMAL LOW (ref 13.0–17.0)
MCH: 30.1 pg (ref 26.0–34.0)
MCHC: 34.8 g/dL (ref 30.0–36.0)
MCV: 86.6 fL (ref 80.0–100.0)
Platelets: 222 10*3/uL (ref 150–400)
RBC: 4.25 MIL/uL (ref 4.22–5.81)
RDW: 12.4 % (ref 11.5–15.5)
WBC: 7.5 10*3/uL (ref 4.0–10.5)
nRBC: 0 % (ref 0.0–0.2)

## 2022-03-03 LAB — MAGNESIUM: Magnesium: 1.6 mg/dL — ABNORMAL LOW (ref 1.7–2.4)

## 2022-03-03 LAB — GLUCOSE, CAPILLARY: Glucose-Capillary: 174 mg/dL — ABNORMAL HIGH (ref 70–99)

## 2022-03-03 MED ORDER — LACTATED RINGERS IV SOLN: INTRAVENOUS | Status: AC

## 2022-03-03 MED ORDER — CHLORHEXIDINE GLUCONATE CLOTH 2 % EX PADS
6.0000 | MEDICATED_PAD | Freq: Every day | CUTANEOUS | Status: DC
  Administered 2022-03-04: 08:00:00 6 via TOPICAL

## 2022-03-03 MED ORDER — MAGNESIUM SULFATE 2 GM/50ML IV SOLN
2.0000 g | Freq: Once | INTRAVENOUS | Status: AC
  Administered 2022-03-03: 2 g via INTRAVENOUS
  Filled 2022-03-03: qty 50

## 2022-03-03 MED ORDER — POTASSIUM CHLORIDE CRYS ER 20 MEQ PO TBCR
40.0000 meq | EXTENDED_RELEASE_TABLET | Freq: Once | ORAL | Status: AC
  Administered 2022-03-03: 40 meq via ORAL
  Filled 2022-03-03: qty 2

## 2022-03-03 NOTE — Assessment & Plan Note (Signed)
Replace and follow. ?

## 2022-03-03 NOTE — Assessment & Plan Note (Addendum)
Urinary retention ?Bilateral hydronephrosis/hydroureter ?Gross hematuria ?Urology consultation appreciated.  They indicate severe bladder distention indicative of chronic outlet obstruction. ?They also indicate that gross hematuria is secondary to relief of severe bladder distention ?Continue tamsulosin and Proscar initiated in the hospital. ?Recommending Foley catheter x10-14 days and outpatient follow-up with urology for voiding trial. ?Repeat renal ultrasound 03/03/2022 shows improvement in the hydronephrosis. ?

## 2022-03-03 NOTE — Progress Notes (Signed)
PROGRESS NOTE   Alan Holt  CWC:[id number]    DOB: [birth year]    DOA: 03/02/2022  PCP: Teodora Medici, DO   I have briefly reviewed patients [facility].  Chief Complaint  Patient presents with   Abdominal Pain    Hospital Course:  71 year old married male with medical history significant for DM 2, hypothyroidism, HTN, prediabetes, presented to the ED with complaints of left flank pain, nausea, vomiting, constipation, fever of 100.1 F at home, and worsening urinary frequency but small amounts.  Admitted for acute kidney injury due to bladder outlet obstruction with severe bilateral hydroureteronephrosis and associated hypercalcemia.  Foley catheter placed, IV fluids initiated and urology consulted.  Slowly improving.   Assessment & Plan:  Principal Problem:   AKI (acute kidney injury) ([place]) Active Problems:   Bladder outlet obstruction   Severe bilateral hydroureteronephrosis   Hypercalcemia   Hypothyroidism   DM2 (diabetes mellitus, type 2) (HCC)   BPH (benign prostatic hyperplasia)   HTN (hypertension)   Hematuria   Renal calculus, right, Punctate nonobstructing    Hypokalemia   Hypomagnesemia   Assessment and Plan: * AKI (acute kidney injury) ([place]) Cr 2.7 on admission (baseline 0.9 back in Oct 2022), due bladder outlet obstruction/obstructive uropathy S/p Foley catheter placement Creatinine down to 1.96. 6.28 L urine output in the last 24 hours.  Do not believe he is able to keep up with this by adequate oral fluid intake. Initiated IV fluids at 100 mL/h. Follow BMP in AM.  Bladder outlet obstruction Urinary retention Bilateral hydronephrosis/hydroureter Gross hematuria Urology consultation appreciated.  They indicate severe bladder distention indicative of chronic outlet obstruction. They also indicate that gross hematuria is secondary to relief of severe bladder distention Continue tamsulosin. Recommending Foley catheter  x10-14 days and outpatient follow-up with urology for voiding trial. Repeat renal ultrasound 03/04/2022.  Severe bilateral hydroureteronephrosis 2/2 bladder outlet obstruction.   Foley inserted in the ED with 2L urine return Management of Foley catheter and acute urinary retention as noted above.  Hypercalcemia Ca 14.5 on presentation. Likely related to acute kidney injury.  Calcium has decreased to 11.3. Initiated IV fluids. Follow serum calcium daily and if does not continue to progressively improve, will need further evaluation.  Hypomagnesemia Replace and follow  Hypokalemia Replace and follow  Hematuria Due to relief of bladder outlet obstruction as per urology input. Outpatient follow-up with urology  HTN (hypertension) BP 170's on presentation.  Not on hypertensives at home.  Maybe exacerbated by urinary retention. Ongoing mild hypertension For now continue as needed IV hydralazine.  May need to add maintenance meds if not improving.  BPH (benign prostatic hyperplasia) CT a/p showed enlarged prostate gland.  Pt had not been on medication for BPH. Started Flomax and Proscar Outpatient urology follow-up.  DM2 (diabetes mellitus, type 2) (HCC) A1c 6.4 suggest good outpatient control. Not checking CBG or treating while hospitalized  Hypothyroidism Continue Synthroid.   Body mass index is 28.28 kg/m.    DVT prophylaxis: enoxaparin (LOVENOX) injection 40 mg Start: 03/02/22 2200     Code Status: Full Code:  Family Communication: Discussed with spouse at bedside. Disposition:  Status is: Inpatient Remains inpatient appropriate because: Ongoing AKI, hypercalcemia with need for further IV fluids, close monitoring and management.    Consultants:   Urology  Procedures:   Foley catheter  Antimicrobials:      Subjective:  Denies complaints.  Does not seem to be drinking much fluids.  No pain.  Objective:   Vitals:   03/02/22 2113 03/02/22 2306 03/03/22  0351 03/03/22 0851  BP: (!) 177/95 (!) 159/85 133/72 (!) 147/70  Pulse: 68 76 64 74  Resp:  '20 18 18  '$ Temp:   98.3 F (36.8 C) (!) 97.5 F (36.4 C)  TempSrc:   Oral   SpO2:  96% 97% 100%  Weight:      Height:        General exam: Elderly male, moderately built and nourished lying comfortably propped up in bed without distress.  Oral mucosa with borderline hydration. Respiratory system: Clear to auscultation. Respiratory effort normal. Cardiovascular system: S1 & S2 heard, RRR. No JVD, murmurs, rubs, gallops or clicks. No pedal edema. Gastrointestinal system: Abdomen is nondistended, soft and nontender. No organomegaly or masses felt. Normal bowel sounds heard. GU: Foley catheter in place. Central nervous system: Alert and oriented. No focal neurological deficits. Extremities: Symmetric 5 x 5 power. Skin: No rashes, lesions or ulcers Psychiatry: Judgement and insight appear normal. Mood & affect appropriate.     Data Reviewed:   I have personally reviewed following labs and imaging studies   CBC: Recent Labs  Lab 03/01/22 1342 03/02/22 0813 03/03/22 0430  WBC 10.2 7.5 7.5  NEUTROABS 8,303*  --   --   HGB 14.2 13.8 12.8*  HCT 41.5 40.0 36.8*  MCV 89.6 88.5 86.6  PLT 228 210 256    Basic Metabolic Panel: Recent Labs  Lab 03/01/22 1342 03/02/22 0813 03/03/22 0430  NA 144 141 141  K 3.9 3.4* 3.3*  CL 104 103 105  CO2 '30 30 27  '$ GLUCOSE 196* 135* 136*  BUN 41* 43* 36*  CREATININE 2.70* 2.82* 1.96*  CALCIUM 14.5* 13.4* 11.3*  MG  --   --  1.6*    Liver Function Tests: Recent Labs  Lab 03/01/22 1342 03/02/22 0813  AST 19 22  ALT 22 26  ALKPHOS  --  64  BILITOT 0.6 0.8  PROT 6.8 7.3  ALBUMIN  --  4.2    CBG: Recent Labs  Lab 03/03/22 0852  GLUCAP 174*    Microbiology Studies:   Recent Results (from the past 240 hour(s))  Resp Panel by RT-PCR (Flu A&B, Covid) Nasopharyngeal Swab     Status: None   Collection Time: 03/02/22  9:46 AM   Specimen:  Nasopharyngeal Swab; Nasopharyngeal(NP) swabs in vial transport medium  Result Value Ref Range Status   SARS Coronavirus 2 by RT PCR NEGATIVE NEGATIVE Final    Comment: (NOTE) SARS-CoV-2 target nucleic acids are NOT DETECTED.  The SARS-CoV-2 RNA is generally detectable in upper respiratory specimens during the acute phase of infection. The lowest concentration of SARS-CoV-2 viral copies this assay can detect is 138 copies/mL. A negative result does not preclude SARS-Cov-2 infection and should not be used as the sole basis for treatment or other patient management decisions. A negative result may occur with  improper specimen collection/handling, submission of specimen other than nasopharyngeal swab, presence of viral mutation(s) within the areas targeted by this assay, and inadequate number of viral copies(<138 copies/mL). A negative result must be combined with clinical observations, patient history, and epidemiological information. The expected result is Negative.  Fact Sheet for Patients:  EntrepreneurPulse.com.au  Fact Sheet for Healthcare Providers:  IncredibleEmployment.be  This test is no t yet approved or cleared by the [place] FDA and  has been authorized for detection and/or diagnosis of SARS-CoV-2 by FDA under an Emergency Use Authorization (EUA). This EUA  will remain  in effect (meaning this test can be used) for the duration of the COVID-19 declaration under Section 564(b)(1) of the Act, 21 U.S.C.section 360bbb-3(b)(1), unless the authorization is terminated  or revoked sooner.       Influenza A by PCR NEGATIVE NEGATIVE Final   Influenza B by PCR NEGATIVE NEGATIVE Final    Comment: (NOTE) The Xpert Xpress SARS-CoV-2/FLU/RSV plus assay is intended as an aid in the diagnosis of influenza from Nasopharyngeal swab specimens and should not be used as a sole basis for treatment. Nasal washings and aspirates are unacceptable for  Xpert Xpress SARS-CoV-2/FLU/RSV testing.  Fact Sheet for Patients: EntrepreneurPulse.com.au  Fact Sheet for Healthcare Providers: IncredibleEmployment.be  This test is not yet approved or cleared by the [place] FDA and has been authorized for detection and/or diagnosis of SARS-CoV-2 by FDA under an Emergency Use Authorization (EUA). This EUA will remain in effect (meaning this test can be used) for the duration of the COVID-19 declaration under Section 564(b)(1) of the Act, 21 U.S.C. section 360bbb-3(b)(1), unless the authorization is terminated or revoked.  Performed at [facility], [address], [place], [place] [address]     Radiology Studies:  CT ABDOMEN PELVIS WO CONTRAST  Result Date: 03/02/2022 CLINICAL DATA:  Flank pain.  Urinary frequency.  Fever for 10 days. EXAM: CT ABDOMEN AND PELVIS WITHOUT CONTRAST TECHNIQUE: Multidetector CT imaging of the abdomen and pelvis was performed following the standard protocol without IV contrast. RADIATION DOSE REDUCTION: This exam was performed according to the departmental dose-optimization program which includes automated exposure control, adjustment of the mA and/or kV according to patient size and/or use of iterative reconstruction technique. COMPARISON:  None. FINDINGS: Lower chest: No acute abnormality. Hepatobiliary: No focal liver abnormality is seen. No gallstones, gallbladder wall thickening, or biliary dilatation. Pancreas: Unremarkable. No pancreatic ductal dilatation or surrounding inflammatory changes. Spleen: Normal in size without focal abnormality. Adrenals/Urinary Tract: Adrenal glands are unremarkable. Punctate nonobstructing right renal calculus. Small right renal cyst measuring 10 mm along the posterior interpolar aspect. Severe bilateral hydroureteronephrosis. Severe bladder distension with mild irregular bladder wall thickening. Small anterior bladder diverticulum.  Stomach/Bowel: Stomach is within normal limits. No evidence of bowel wall thickening, distention, or inflammatory changes. Vascular/Lymphatic: No significant vascular findings are present. No enlarged abdominal or pelvic lymph nodes. Reproductive: Enlarged prostate gland. Other: No abdominal wall hernia or abnormality. No abdominopelvic ascites. Musculoskeletal: No acute osseous abnormality. No aggressive osseous lesion. Mild osteoarthritis of bilateral SI joints. Mild degenerative disease with disc height loss at L2-3, L3-4, L4-5 and L5-S1 with bilateral facet arthropathy. IMPRESSION: 1. Severe bladder distension with mild irregular bladder wall thickening, suggestive of chronic bladder outlet obstruction. Severe bilateral hydroureteronephrosis, likely secondary to bladder outlet obstruction. 2. Punctate nonobstructing right renal calculus. 3. Enlarged prostate gland. Electronically Signed   By: Kathreen Devoid M.D.   On: 03/02/2022 08:30   US RENAL  Result Date: 03/03/2022 CLINICAL DATA:  AKI EXAM: RENAL / URINARY TRACT ULTRASOUND COMPLETE COMPARISON:  CT abdomen/pelvis 1 day prior FINDINGS: Right Kidney: Renal measurements: 14.0 cm x 6.0 cm x 6.3 cm = volume: 274 mL. Parenchymal echogenicity is within normal limits. There is a 1.2 cm anechoic cyst in the interpolar region. There is mild fullness of the collecting system without frank hydronephrosis, likely improved compared to the prior CT. Left Kidney: Renal measurements: 12.8 cm x 5.8 cm x 6.5 cm = volume: 154 mL. Parenchymal echogenicity is normal. There is mild hydronephrosis, also likely improved  compared to the prior CT. Bladder: There is marked thickening of the bladder wall with a Foley catheter in place. Other: None. IMPRESSION: 1. Interval decompression of the bladder with improvement in the previously seen bilateral hydronephrosis. 2. Markedly thickened bladder wall likely reflecting decompression and sequela of chronic outlet obstruction.  Electronically Signed   By: Valetta Mole M.D.   On: 03/03/2022 12:21    Scheduled Meds:    enoxaparin (LOVENOX) injection  40 mg Subcutaneous Q24H   finasteride  5 mg Oral Daily   levothyroxine  150 mcg Oral Q0600   tamsulosin  0.4 mg Oral Daily    Continuous Infusions:    lactated ringers Stopped (03/03/22 1113)   lactated ringers 100 mL/hr at 03/03/22 1117     LOS: 1 day     Vernell Leep, MD,  FACP, [facility], [facility], [facility] (Care Management Physician Certified) [place]  To contact the attending provider between 7A-7P or the covering provider during after hours 7P-7A, please log into the web site [email or web address] and access using universal [place] password for that web site. If you do not have the password, please call the hospital operator.  03/03/2022, 2:36 PM

## 2022-03-03 NOTE — Care Management (Signed)
?  Transition of Care (TOC) Screening Note ? ? ?Patient Details  ?Name: Alan Holt ?Date of Birth: [birth year] ? ? ?Transition of Care (TOC) CM/SW Contact:    ?Pete Pelt, RN ?Phone Number: ?03/03/2022, 4:04 PM ? ?Patient will go home with foley catheter.  He and his wife feel confident they can manage after appropriate training. ? ?Transition of Care Department [facility]) has reviewed patient and no TOC needs have been identified at this time. We will continue to monitor patient advancement through interdisciplinary progression rounds. If new patient transition needs arise, please place a TOC consult. ?  ?

## 2022-03-03 NOTE — Hospital Course (Signed)
71 year old married male with medical history significant for DM 2, hypothyroidism, HTN, prediabetes, presented to the ED with complaints of left flank pain, nausea, vomiting, constipation, fever of 100.1 ?F at home, and worsening urinary frequency but small amounts.  Admitted for acute kidney injury due to bladder outlet obstruction with severe bilateral hydroureteronephrosis and associated hypercalcemia.  Foley catheter placed, IV fluids initiated and urology consulted.  Slowly improving. ?

## 2022-03-04 LAB — MAGNESIUM: Magnesium: 2 mg/dL (ref 1.7–2.4)

## 2022-03-04 LAB — CBC
HCT: 38 % — ABNORMAL LOW (ref 39.0–52.0)
Hemoglobin: 13.1 g/dL (ref 13.0–17.0)
MCH: 30 pg (ref 26.0–34.0)
MCHC: 34.5 g/dL (ref 30.0–36.0)
MCV: 87.2 fL (ref 80.0–100.0)
Platelets: 220 10*3/uL (ref 150–400)
RBC: 4.36 MIL/uL (ref 4.22–5.81)
RDW: 12.2 % (ref 11.5–15.5)
WBC: 8 10*3/uL (ref 4.0–10.5)
nRBC: 0 % (ref 0.0–0.2)

## 2022-03-04 LAB — BASIC METABOLIC PANEL
Anion gap: 5 (ref 5–15)
BUN: 36 mg/dL — ABNORMAL HIGH (ref 8–23)
CO2: 30 mmol/L (ref 22–32)
Calcium: 9.8 mg/dL (ref 8.9–10.3)
Chloride: 107 mmol/L (ref 98–111)
Creatinine, Ser: 1.82 mg/dL — ABNORMAL HIGH (ref 0.61–1.24)
GFR, Estimated: 39 mL/min — ABNORMAL LOW (ref >60)
Glucose, Bld: 119 mg/dL — ABNORMAL HIGH (ref 70–99)
Potassium: 3.7 mmol/L (ref 3.5–5.1)
Sodium: 142 mmol/L (ref 135–145)

## 2022-03-04 MED ORDER — ACETAMINOPHEN 325 MG PO TABS: 650.0000 mg | ORAL_TABLET | Freq: Four times a day (QID) | ORAL | Status: AC | PRN

## 2022-03-04 MED ORDER — POLYETHYLENE GLYCOL 3350 17 G PO PACK: 17.0000 g | PACK | Freq: Every day | ORAL | 0 refills | Status: DC | PRN

## 2022-03-04 MED ORDER — FINASTERIDE 5 MG PO TABS: 5.0000 mg | ORAL_TABLET | Freq: Every day | ORAL | 1 refills | Status: AC

## 2022-03-04 MED ORDER — BISACODYL 10 MG RE SUPP
10.0000 mg | Freq: Once | RECTAL | Status: AC
  Administered 2022-03-04: 10:00:00 10 mg via RECTAL
  Filled 2022-03-04: qty 1

## 2022-03-04 MED ORDER — TAMSULOSIN HCL 0.4 MG PO CAPS: 0.4000 mg | ORAL_CAPSULE | Freq: Every day | ORAL | 1 refills | Status: AC

## 2022-03-04 NOTE — Discharge Summary (Signed)
Physician Discharge Summary  Alan Holt ZOX:[id number] DOB: 09-05-1951  PCP: Teodora Medici, DO  Admitted from: Home Discharged to: Home  Admit date: 03/02/2022 Discharge date: 03/04/2022  Recommendations for Outpatient Follow-up:    Follow-up Information     Teodora Medici, DO Follow up on 03/08/2022.   Specialty: Internal Medicine Why: With Dr. Kerry Kass at 9;40am. To be seen with repeat labs (CBC & BMP). Contact information: [address] [place] [address] [phone]         Abbie Sons, MD. Schedule an appointment as soon as possible for a visit.   Specialty: Urology Why: Follow-up regarding management of urinary catheter. Contact information: [place] [address] [phone]                  Home Health: None    Equipment/Devices: Indwelling Foley catheter    Discharge Condition: Improved and stable.   Code Status: Full Code Diet recommendation:  Discharge Diet Orders (From admission, onward)     Start     Ordered   03/04/22 0000  Diet - low sodium heart healthy        03/04/22 1224             Discharge Diagnoses:  Principal Problem:   AKI (acute kidney injury) ([place]) Active Problems:   Bladder outlet obstruction   Severe bilateral hydroureteronephrosis   Hypercalcemia   Hypothyroidism   DM2 (diabetes mellitus, type 2) (HCC)   BPH (benign prostatic hyperplasia)   HTN (hypertension)   Hematuria   Renal calculus, right, Punctate nonobstructing    Hypokalemia   Hypomagnesemia   Brief Summary: 71 year old married male with medical history significant for DM 2, hypothyroidism, HTN, prediabetes, presented to the ED with complaints of left flank pain, nausea, vomiting, constipation, fever of 100.1 F at home, and worsening urinary frequency but small amounts.  Admitted for acute kidney injury due to bladder outlet obstruction with severe bilateral hydroureteronephrosis and  associated hypercalcemia.  Foley catheter placed, IV fluids initiated and urology consulted.  Slowly improving.   Assessment and Plan: * AKI (acute kidney injury) ([place]) Cr 2.7 on admission (baseline 0.9 back in Oct 2022), due bladder outlet obstruction/obstructive uropathy S/p Foley catheter placement Creatinine down to 1.8 on day of discharge. Had 6.28 L urine output in the 24 hours 3/7 - 3/8.  Urine output has dropped but still good, down to 3600 in the last 24 hours.  Patient able to keep up with adequate oral intake which has been repeatedly and clearly been advised to him and his wife at bedside who verbalized understanding.  S/p IV fluids, discontinued. Encouraged continued liberal oral fluid intake, avoid nephrotoxic agents, discontinued as needed Motrin from his home med list, patient has already made a follow-up appointment with his PCP on Monday 3/13 for repeat labs and follow-up.  Bladder outlet obstruction Urinary retention Bilateral hydronephrosis/hydroureter Gross hematuria Urology consultation appreciated.  They indicate severe bladder distention indicative of chronic outlet obstruction. They also indicate that gross hematuria is secondary to relief of severe bladder distention Continue tamsulosin and Proscar initiated in the hospital. Recommending Foley catheter x10-14 days and outpatient follow-up with urology for voiding trial. Repeat renal ultrasound 03/03/2022 shows improvement in the hydronephrosis.  Severe bilateral hydroureteronephrosis 2/2 bladder outlet obstruction.   Foley inserted in the ED with 2L urine return Management of Foley catheter and acute urinary retention as noted above.  Hypercalcemia Ca 14.5 on presentation. Likely related to  acute kidney injury.  Serum calcium has normalized. Initiated IV fluids. Discontinued calcium supplements for now and can be reevaluated during outpatient follow-up.  Patient does not take vitamin D  supplements.  Hypomagnesemia Replace and follow  Hypokalemia Replace and follow  Hematuria Due to relief of bladder outlet obstruction as per urology input. Outpatient follow-up with urology  HTN (hypertension) BP 170's on presentation.  Not on hypertensives at home.  Maybe exacerbated by urinary retention. Mild intermittent elevated blood pressures. Close outpatient follow-up to determine if needs to initiate maintenance antihypertensives  BPH (benign prostatic hyperplasia) CT a/p showed enlarged prostate gland.  Pt had not been on medication for BPH. Started Flomax and Proscar Outpatient urology follow-up.  DM2 (diabetes mellitus, type 2) (HCC) A1c 6.4 suggest good outpatient control. Not checking CBG or treating while hospitalized  Hypothyroidism Continue Synthroid.   Body mass index is 28.28 kg/m.  Constipation Patient reported that he had not had a BM for several days.  Passing flatus.  No abdominal pain or distention.  Had a BM after Dulcolax suppositories.  Bowel regimen as outpatient.  Hypokalemia/hypomagnesemia Replaced.  Consultations: Urology  Procedures: Foley catheter   Discharge Instructions  Discharge Instructions     Call MD for:  difficulty breathing, headache or visual disturbances   Complete by: As directed    Call MD for:  extreme fatigue   Complete by: As directed    Call MD for:  persistant dizziness or light-headedness   Complete by: As directed    Call MD for:  persistant nausea and vomiting   Complete by: As directed    Call MD for:  severe uncontrolled pain   Complete by: As directed    Call MD for:  temperature >100.4   Complete by: As directed    Diet - low sodium heart healthy   Complete by: As directed    Discharge instructions   Complete by: As directed    Patient discharging with indwelling Foley catheter.   Increase activity slowly   Complete by: As directed    Insert foley catheter   Complete by: As directed          Medication List     STOP taking these medications    calcium citrate 950 (200 Ca) MG tablet Commonly known as: CALCITRATE - dosed in mg elemental calcium   cholecalciferol 1000 units tablet Commonly known as: VITAMIN D   ibuprofen 600 MG tablet Commonly known as: ADVIL       TAKE these medications    Accu-Chek Guide w/Device Kit See admin instructions.   acetaminophen 325 MG tablet Commonly known as: TYLENOL Take 2 tablets (650 mg total) by mouth every 6 (six) hours as needed for moderate pain, mild pain or fever.   aspirin 81 MG tablet Take 81 mg by mouth daily.   BLOOD GLUCOSE TEST STRIPS Strp Use as directed to monitor FSBS once daily prn. Dx: E11.65   finasteride 5 MG tablet Commonly known as: PROSCAR Take 1 tablet (5 mg total) by mouth daily. Start taking on: March 05, 2022   Lancets Misc Use as directed to monitor FSBS once daily prn. Dx: E11.65   levothyroxine 150 MCG tablet Commonly known as: SYNTHROID Take 1 tablet by mouth daily.   MULTIVITAMIN ADULT PO Take 1 tablet by mouth daily.   niacin 500 MG CR tablet Commonly known as: NIASPAN Take 3 tablets (1,500 mg total) by mouth at bedtime.   ondansetron 4 MG tablet Commonly known as:  Zofran Take 1 tablet (4 mg total) by mouth every 8 (eight) hours as needed for nausea or vomiting.   polyethylene glycol 17 g packet Commonly known as: MIRALAX / GLYCOLAX Take 17 g by mouth daily as needed for mild constipation or moderate constipation.   tamsulosin 0.4 MG Caps capsule Commonly known as: FLOMAX Take 1 capsule (0.4 mg total) by mouth daily. Start taking on: March 05, 2022       Allergies  Allergen Reactions   Statins     Elevated CK with dark urine   Erythromycin Rash      Procedures/Studies: CT ABDOMEN PELVIS WO CONTRAST  Result Date: 03/02/2022 CLINICAL DATA:  Flank pain.  Urinary frequency.  Fever for 10 days. EXAM: CT ABDOMEN AND PELVIS WITHOUT CONTRAST TECHNIQUE:  Multidetector CT imaging of the abdomen and pelvis was performed following the standard protocol without IV contrast. RADIATION DOSE REDUCTION: This exam was performed according to the departmental dose-optimization program which includes automated exposure control, adjustment of the mA and/or kV according to patient size and/or use of iterative reconstruction technique. COMPARISON:  None. FINDINGS: Lower chest: No acute abnormality. Hepatobiliary: No focal liver abnormality is seen. No gallstones, gallbladder wall thickening, or biliary dilatation. Pancreas: Unremarkable. No pancreatic ductal dilatation or surrounding inflammatory changes. Spleen: Normal in size without focal abnormality. Adrenals/Urinary Tract: Adrenal glands are unremarkable. Punctate nonobstructing right renal calculus. Small right renal cyst measuring 10 mm along the posterior interpolar aspect. Severe bilateral hydroureteronephrosis. Severe bladder distension with mild irregular bladder wall thickening. Small anterior bladder diverticulum. Stomach/Bowel: Stomach is within normal limits. No evidence of bowel wall thickening, distention, or inflammatory changes. Vascular/Lymphatic: No significant vascular findings are present. No enlarged abdominal or pelvic lymph nodes. Reproductive: Enlarged prostate gland. Other: No abdominal wall hernia or abnormality. No abdominopelvic ascites. Musculoskeletal: No acute osseous abnormality. No aggressive osseous lesion. Mild osteoarthritis of bilateral SI joints. Mild degenerative disease with disc height loss at L2-3, L3-4, L4-5 and L5-S1 with bilateral facet arthropathy. IMPRESSION: 1. Severe bladder distension with mild irregular bladder wall thickening, suggestive of chronic bladder outlet obstruction. Severe bilateral hydroureteronephrosis, likely secondary to bladder outlet obstruction. 2. Punctate nonobstructing right renal calculus. 3. Enlarged prostate gland. Electronically Signed   By: Kathreen Devoid M.D.   On: 03/02/2022 08:30   US RENAL  Result Date: 03/03/2022 CLINICAL DATA:  AKI EXAM: RENAL / URINARY TRACT ULTRASOUND COMPLETE COMPARISON:  CT abdomen/pelvis 1 day prior FINDINGS: Right Kidney: Renal measurements: 14.0 cm x 6.0 cm x 6.3 cm = volume: 274 mL. Parenchymal echogenicity is within normal limits. There is a 1.2 cm anechoic cyst in the interpolar region. There is mild fullness of the collecting system without frank hydronephrosis, likely improved compared to the prior CT. Left Kidney: Renal measurements: 12.8 cm x 5.8 cm x 6.5 cm = volume: 154 mL. Parenchymal echogenicity is normal. There is mild hydronephrosis, also likely improved compared to the prior CT. Bladder: There is marked thickening of the bladder wall with a Foley catheter in place. Other: None. IMPRESSION: 1. Interval decompression of the bladder with improvement in the previously seen bilateral hydronephrosis. 2. Markedly thickened bladder wall likely reflecting decompression and sequela of chronic outlet obstruction. Electronically Signed   By: Valetta Mole M.D.   On: 03/03/2022 12:21      Subjective: Denies complaints.  Showered yesterday.  Ambulated to the bathroom and in the room without difficulty.  Had BM after Dulcolax suppository.  No pain or other complaints reported.  Discharge  Exam:  Vitals:   03/03/22 1937 03/04/22 0119 03/04/22 0529 03/04/22 0845  BP: (!) 163/87 (!) 157/79 (!) 165/94 (!) 141/73  Pulse: 68 61 (!) 57 65  Resp: '20 19 17 18  ' Temp: 98.4 F (36.9 C) 98 F (36.7 C) 98.1 F (36.7 C) 97.6 F (36.4 C)  TempSrc:      SpO2: 98% 97% 100% 98%  Weight:      Height:          General exam: Elderly male, moderately built and nourished lying comfortably propped up in bed without distress.  Oral mucosa moist Respiratory system: Clear to auscultation. Respiratory effort normal. Cardiovascular system: S1 & S2 heard, RRR. No JVD, murmurs, rubs, gallops or clicks. No pedal  edema. Gastrointestinal system: Abdomen is nondistended, soft and nontender. No organomegaly or masses felt. Normal bowel sounds heard. GU: Foley catheter in place.  Faintly blood-tinged urine. Central nervous system: Alert and oriented. No focal neurological deficits. Extremities: Symmetric 5 x 5 power. Skin: No rashes, lesions or ulcers Psychiatry: Judgement and insight appear normal. Mood & affect appropriate.     The results of significant diagnostics from this hospitalization (including imaging, microbiology, ancillary and laboratory) are listed below for reference.     Microbiology: Recent Results (from the past 240 hour(s))  Resp Panel by RT-PCR (Flu A&B, Covid) Nasopharyngeal Swab     Status: None   Collection Time: 03/02/22  9:46 AM   Specimen: Nasopharyngeal Swab; Nasopharyngeal(NP) swabs in vial transport medium  Result Value Ref Range Status   SARS Coronavirus 2 by RT PCR NEGATIVE NEGATIVE Final    Comment: (NOTE) SARS-CoV-2 target nucleic acids are NOT DETECTED.  The SARS-CoV-2 RNA is generally detectable in upper respiratory specimens during the acute phase of infection. The lowest concentration of SARS-CoV-2 viral copies this assay can detect is 138 copies/mL. A negative result does not preclude SARS-Cov-2 infection and should not be used as the sole basis for treatment or other patient management decisions. A negative result may occur with  improper specimen collection/handling, submission of specimen other than nasopharyngeal swab, presence of viral mutation(s) within the areas targeted by this assay, and inadequate number of viral copies(<138 copies/mL). A negative result must be combined with clinical observations, patient history, and epidemiological information. The expected result is Negative.  Fact Sheet for Patients:  EntrepreneurPulse.com.au  Fact Sheet for Healthcare Providers:  IncredibleEmployment.be  This test  is no t yet approved or cleared by the [place] FDA and  has been authorized for detection and/or diagnosis of SARS-CoV-2 by FDA under an Emergency Use Authorization (EUA). This EUA will remain  in effect (meaning this test can be used) for the duration of the COVID-19 declaration under Section 564(b)(1) of the Act, 21 U.S.C.section 360bbb-3(b)(1), unless the authorization is terminated  or revoked sooner.       Influenza A by PCR NEGATIVE NEGATIVE Final   Influenza B by PCR NEGATIVE NEGATIVE Final    Comment: (NOTE) The Xpert Xpress SARS-CoV-2/FLU/RSV plus assay is intended as an aid in the diagnosis of influenza from Nasopharyngeal swab specimens and should not be used as a sole basis for treatment. Nasal washings and aspirates are unacceptable for Xpert Xpress SARS-CoV-2/FLU/RSV testing.  Fact Sheet for Patients: EntrepreneurPulse.com.au  Fact Sheet for Healthcare Providers: IncredibleEmployment.be  This test is not yet approved or cleared by the [place] FDA and has been authorized for detection and/or diagnosis of SARS-CoV-2 by FDA under an Emergency Use Authorization (EUA). This EUA will  remain in effect (meaning this test can be used) for the duration of the COVID-19 declaration under Section 564(b)(1) of the Act, 21 U.S.C. section 360bbb-3(b)(1), unless the authorization is terminated or revoked.  Performed at [facility], [address] [place], [place] [address]      Labs: CBC: Recent Labs  Lab 03/01/22 1342 03/02/22 0813 03/03/22 0430 03/04/22 0500  WBC 10.2 7.5 7.5 8.0  NEUTROABS 8,303*  --   --   --   HGB 14.2 13.8 12.8* 13.1  HCT 41.5 40.0 36.8* 38.0*  MCV 89.6 88.5 86.6 87.2  PLT 228 210 222 263    Basic Metabolic Panel: Recent Labs  Lab 03/01/22 1342 03/02/22 0813 03/03/22 0430 03/04/22 0500  NA 144 141 141 142  K 3.9 3.4* 3.3* 3.7  CL 104 103 105 107  CO2 '30 30 27 30  ' GLUCOSE  196* 135* 136* 119*  BUN 41* 43* 36* 36*  CREATININE 2.70* 2.82* 1.96* 1.82*  CALCIUM 14.5* 13.4* 11.3* 9.8  MG  --   --  1.6* 2.0    Liver Function Tests: Recent Labs  Lab 03/01/22 1342 03/02/22 0813  AST 19 22  ALT 22 26  ALKPHOS  --  64  BILITOT 0.6 0.8  PROT 6.8 7.3  ALBUMIN  --  4.2    CBG: Recent Labs  Lab 03/03/22 0852  GLUCAP 174*    Hgb A1c Recent Labs    03/01/22 1342  HGBA1C 6.4*   Urinalysis    Component Value Date/Time   COLORURINE YELLOW (A) 03/02/2022 0813   APPEARANCEUR HAZY (A) 03/02/2022 0813   LABSPEC 1.008 03/02/2022 0813   PHURINE 6.0 03/02/2022 0813   GLUCOSEU NEGATIVE 03/02/2022 0813   HGBUR NEGATIVE 03/02/2022 0813   BILIRUBINUR NEGATIVE 03/02/2022 0813   BILIRUBINUR neg 10/26/2021 1000   KETONESUR NEGATIVE 03/02/2022 0813   PROTEINUR NEGATIVE 03/02/2022 0813   UROBILINOGEN 0.2 10/26/2021 1000   NITRITE NEGATIVE 03/02/2022 0813   LEUKOCYTESUR NEGATIVE 03/02/2022 0813      Time coordinating discharge: 35 minutes  SIGNED:  Vernell Leep, MD,  FACP, [facility], [facility], [facility] (Care Management Physician Certified). [facility]  To contact the attending provider between 7A-7P or the covering provider during after hours 7P-7A, please log into the web site [email or web address] and access using universal [place] password for that web site. If you do not have the password, please call the hospital operator.

## 2022-03-04 NOTE — Discharge Instructions (Signed)

## 2022-03-05 ENCOUNTER — Telehealth: Payer: Self-pay

## 2022-03-05 NOTE — Telephone Encounter (Signed)
Transition Care Management Follow-up Telephone Call ?Date of discharge and from where: 03/04/22 [facility]  ?How have you been since you were released from the hospital? Pt states he is doing okay ?Any questions or concerns? No ? ?Items Reviewed: ?Did the pt receive and understand the discharge instructions provided? Yes  ?Medications obtained and verified? Yes  ?Other? No  ?Any new allergies since your discharge? No  ?Dietary orders reviewed? Yes ?Do you have support at home? Yes  ? ?Home Care and Equipment/Supplies: ?Were home health services ordered? no ? ?Were any new equipment or medical supplies ordered?  No ? ? ?Functional Questionnaire: (I = Independent and D = Dependent) ?ADLs: I ? ?Bathing/Dressing- I ? ?Meal Prep- I ? ?Eating- I ? ?Maintaining continence- I ? ?Transferring/Ambulation- I ? ?Managing Meds- I ? ?Follow up appointments reviewed: ? ?PCP Hospital f/u appt confirmed? Yes  Scheduled to see Dr. Ky Barban on 03/08/22 @ 9:40. ?[facility] f/u appt confirmed? Yes  Scheduled to see Dr. Bernardo Heater on 03/15/22 @ 10:00. ?Are transportation arrangements needed? No  ?If their condition worsens, is the pt aware to call PCP or go to the Emergency Dept.? Yes ?Was the patient provided with contact information for the PCP's office or ED? Yes ?Was to pt encouraged to call back with questions or concerns? Yes ? ?

## 2022-03-08 ENCOUNTER — Encounter: Payer: Self-pay | Admitting: Family Medicine

## 2022-03-08 ENCOUNTER — Ambulatory Visit (INDEPENDENT_AMBULATORY_CARE_PROVIDER_SITE_OTHER): Payer: Medicare PPO | Admitting: Family Medicine

## 2022-03-08 VITALS — BP 126/72 | HR 90 | Temp 97.8°F | Resp 18 | Ht 74.0 in | Wt 217.5 lb

## 2022-03-08 DIAGNOSIS — E876 Hypokalemia: Secondary | ICD-10-CM

## 2022-03-08 DIAGNOSIS — N179 Acute kidney failure, unspecified: Secondary | ICD-10-CM

## 2022-03-08 DIAGNOSIS — N400 Enlarged prostate without lower urinary tract symptoms: Secondary | ICD-10-CM

## 2022-03-08 DIAGNOSIS — N320 Bladder-neck obstruction: Secondary | ICD-10-CM | POA: Diagnosis not present

## 2022-03-08 DIAGNOSIS — N1330 Unspecified hydronephrosis: Secondary | ICD-10-CM

## 2022-03-08 LAB — MAGNESIUM: Magnesium: 1.8 mg/dL (ref 1.5–2.5)

## 2022-03-08 LAB — BASIC METABOLIC PANEL
BUN/Creatinine Ratio: 17 (calc) (ref 6–22)
BUN: 24 mg/dL (ref 7–25)
CO2: 27 mmol/L (ref 20–32)
Calcium: 9.1 mg/dL (ref 8.6–10.3)
Chloride: 107 mmol/L (ref 98–110)
Creat: 1.42 mg/dL — ABNORMAL HIGH (ref 0.70–1.28)
Glucose, Bld: 108 mg/dL — ABNORMAL HIGH (ref 65–99)
Potassium: 3.8 mmol/L (ref 3.5–5.3)
Sodium: 141 mmol/L (ref 135–146)

## 2022-03-08 NOTE — Assessment & Plan Note (Signed)
Improved with foley. Bladder appropriately decompressed on bedside ultrasound today.  Tolerating proscar, flomax, continue. Obtaining labs. Maintain follow up with urology. ?

## 2022-03-08 NOTE — Patient Instructions (Signed)
It was great to see you! ? ?Our plans for today:  ?- Continue your current medications and follow up with urology as scheduled. ? ?We are checking some labs today, we will release these results to your MyChart. ? ?Take care and seek immediate care sooner if you develop any concerns.  ? ?Dr. Ky Barban ? ?

## 2022-03-08 NOTE — Progress Notes (Signed)
? ? ?  SUBJECTIVE:  ? ?CHIEF COMPLAINT / HPI:  ? ?HOSPITAL FOLLOW UP ?Hospital/facility: [facility] 3/7[facility]9 ?Diagnosis: Bladder outlet obstruction 2/2 BPH, hypercalcemia, AKI ?Procedures/tests:  ?- CT A/P - severe bladder distension with thickened bladder wall, b/l hydro, enlarged prostate ?- renal US - improved bladder distension and hydronephrosis. Foley in place. ?Consultants: urology ?New medications:  ?- flomax, proscar ?- indwelling foley cath x10-14 days ?Discharge instructions:   ?- f/u with Urology ?Status: better ?- has appt with Urology 3/20. ?- foley draining well, empties bag few times per day. ? ?OBJECTIVE:  ? ?BP 126/72   Pulse 90   Temp 97.8 ?F (36.6 ?C)   Resp 18   Ht '6\' 2"'$  (1.88 m)   Wt 217 lb 8 oz (98.7 kg)   SpO2 99%   BMI 27.93 kg/m?   ?Gen: well appearing, in NAD ?Card: Reg rate ?Lungs: Comfortable WOB on RA ?Ext: WWP, no edema ? ?Limited Renal/Bladder Ultrasound  ?Findings: Right kidney normal size with no hydronephrosis. Left kidney normal size with no hydronephrosis. Foley in place with decompressed bladder. Thickened bladder wall. ?Impression: No hydronephrosis. Foley in place. Bladder appropriately decompressed.  ? ?ASSESSMENT/PLAN:  ? ?Problem List Items Addressed This Visit   ? ?  ? Genitourinary  ? AKI (acute kidney injury) ([place]) - Primary  ? Relevant Orders  ? Basic Metabolic Panel (BMET)  ? Magnesium  ? Bladder outlet obstruction  ?  Improved with foley. Bladder appropriately decompressed on bedside ultrasound today.  Tolerating proscar, flomax, continue. Obtaining labs. Maintain follow up with urology. ?  ?  ? Relevant Orders  ? Basic Metabolic Panel (BMET)  ? BPH (benign prostatic hyperplasia)  ? Severe bilateral hydroureteronephrosis  ? Relevant Orders  ? Basic Metabolic Panel (BMET)  ?  ? Other  ? Hypercalcemia  ? Relevant Orders  ? Basic Metabolic Panel (BMET)  ? Hypokalemia  ? Relevant Orders  ? Basic Metabolic Panel (BMET)  ? Hypomagnesemia  ? Relevant Orders  ? Basic Metabolic  Panel (BMET)  ? Magnesium  ? ? ? ? ?Myles Gip, DO ?

## 2022-03-15 ENCOUNTER — Encounter (INDEPENDENT_AMBULATORY_CARE_PROVIDER_SITE_OTHER): Payer: Medicare PPO | Admitting: Urology

## 2022-03-15 ENCOUNTER — Encounter: Payer: Self-pay | Admitting: Urology

## 2022-03-15 ENCOUNTER — Other Ambulatory Visit: Payer: Self-pay

## 2022-03-15 ENCOUNTER — Ambulatory Visit: Payer: Medicare PPO | Admitting: Urology

## 2022-03-15 VITALS — BP 136/76 | HR 83 | Ht 74.0 in | Wt 215.0 lb

## 2022-03-15 DIAGNOSIS — R339 Retention of urine, unspecified: Secondary | ICD-10-CM

## 2022-03-15 LAB — BLADDER SCAN AMB NON-IMAGING: Scan Result: >999

## 2022-03-15 MED ORDER — SULFAMETHOXAZOLE-TRIMETHOPRIM 800-160 MG PO TABS
1.0000 | ORAL_TABLET | Freq: Once | ORAL | Status: AC
  Administered 2022-03-15: 1 via ORAL

## 2022-03-15 NOTE — Progress Notes (Signed)
Simple Catheter Placement ? ?Due to urinary retention patient is present today for a foley cath placement.  Patient was cleaned and prepped in a sterile fashion with betadine. A 16 coude foley catheter was inserted, urine return was noted  1544m, urine was yellow in color.  The balloon was filled with 10cc of sterile water.  A leg bag was attached for drainage. Patient was also given a night bag to take home and was given instruction on how to change from one bag to another.  Patient was given instruction on proper catheter care.  Patient tolerated well, no complications were noted  ? ?Performed by: CElberta Leatherwood [place]ie? ?Additional notes/ Follow up: Urodynamic study  ?

## 2022-03-15 NOTE — Progress Notes (Signed)
? ?03/15/2022 ?10:07 AM  ? ?Alan Holt ?Oct 30, 1951 ?[id number] ? ?Referring provider: Teodora Medici, DO ?[address] ?[address] ?[place],  [place] [address] ? ?Chief Complaint  ?Patient presents with  ? Urinary Retention  ? ? ?HPI: ?71 y.o. male presents in follow-up of recent hospitalization for urinary retention. ? ?Refer to my inpatient consult note dated 03/02/2022 ?He was started on tamsulosin after catheter placement then has remained on this medication ?Follow-up ultrasound showed significant improvement and hydronephrosis ? ? ?PMH: ?Past Medical History:  ?Diagnosis Date  ? Allergy   ? Diabetes mellitus without complication ([place])   ? Dysplastic nevus 02/25/2014  ? L mid ant thigh - mild  ? Dysplastic nevus 02/25/2014  ? R med popliteal - mild  ? Dysplastic nevus 02/25/2014  ? R med knee - mild  ? Dysplastic nevus 12/30/2016  ? R pectoral 2.0 cm med to areola - mod  ? Dysplastic nevus 12/30/2016  ? L dorsum prox forearm near antecubital - mod  ? Elevated BP without diagnosis of hypertension 11/10/2016  ? Hypertension   ? Prediabetes 05/10/2018  ? Sleep apnea   ? Thyroid disease   ? ? ?Surgical History: ?Past Surgical History:  ?Procedure Laterality Date  ? HERNIA REPAIR    ? childhood  ? KNEE SURGERY Left 1993  ? MOUTH SURGERY  04/05/2018  ? bone graft, 3 teeth pulled  ? THYROID SURGERY  2015  ? TOTAL THYROIDECTOMY Bilateral 07/15/2014  ? Dr. Kathyrn Sheriff.   ? ? ?Home Medications:  ?Allergies as of 03/15/2022   ? ?   Reactions  ? Statins   ? Elevated CK with dark urine  ? Erythromycin Rash  ? ?  ? ?  ?Medication List  ?  ? ?  ? Accurate as of March 15, 2022 10:07 AM. If you have any questions, ask your nurse or doctor.  ?  ?  ? ?  ? ?STOP taking these medications   ? ?aspirin 81 MG tablet ?Stopped by: Abbie Sons, MD ?  ?MULTIVITAMIN ADULT PO ?Stopped by: Abbie Sons, MD ?  ?niacin 500 MG CR tablet ?Commonly known as: NIASPAN ?Stopped by: Abbie Sons, MD ?  ?ondansetron 4 MG tablet ?Commonly  known as: Zofran ?Stopped by: Abbie Sons, MD ?  ? ?  ? ?TAKE these medications   ? ?Accu-Chek Guide w/Device Kit ?See admin instructions. ?  ?acetaminophen 325 MG tablet ?Commonly known as: TYLENOL ?Take 2 tablets (650 mg total) by mouth every 6 (six) hours as needed for moderate pain, mild pain or fever. ?  ?BLOOD GLUCOSE TEST STRIPS Strp ?Use as directed to monitor FSBS once daily prn. Dx: E11.65 ?  ?finasteride 5 MG tablet ?Commonly known as: PROSCAR ?Take 1 tablet (5 mg total) by mouth daily. ?  ?Lancets Misc ?Use as directed to monitor FSBS once daily prn. Dx: E11.65 ?  ?levothyroxine 150 MCG tablet ?Commonly known as: SYNTHROID ?Take 1 tablet by mouth daily. ?  ?polyethylene glycol 17 g packet ?Commonly known as: MIRALAX / GLYCOLAX ?Take 17 g by mouth daily as needed for mild constipation or moderate constipation. ?  ?tamsulosin 0.4 MG Caps capsule ?Commonly known as: FLOMAX ?Take 1 capsule (0.4 mg total) by mouth daily. ?  ? ?  ? ? ?Allergies:  ?Allergies  ?Allergen Reactions  ? Statins   ?  Elevated CK with dark urine  ? Erythromycin Rash  ? ? ?Family History: ?Family History  ?Problem Relation Age of Onset  ?  Alcohol abuse Father   ? Stroke Father   ? Diabetes Mother   ? Diabetes Brother   ? Prostate cancer Brother   ? Heart attack Brother   ? ? ?Social History:  reports that he has never smoked. He has never used smokeless tobacco. He reports that he does not drink alcohol and does not use drugs. ? ? ?Physical Exam: ?BP 136/76   Pulse 83   Ht _0  (1.88 m)   Wt 215 lb (97.5 kg)   BMI 27.60 kg/m?   ?Constitutional:  Alert and oriented, No acute distress. ?HEENT: [place] AT, moist mucus membranes.  Trachea midline, no masses. ?Respiratory: Normal respiratory effort, no increased work of breathing. ?GI: Abdomen is soft, nontender, nondistended, no abdominal masses ?GU: Foley catheter draining clear urine.  Prostate 60+ cc, smooth without nodules ?Psychiatric: Normal mood and affect. ? ? ?Assessment &  Plan:   ? ?BPH with urinary retention ?Catheter was removed ?He returned this afternoon for a follow-up bladder scan.  He has only dribble small amounts and voided some with a very small stream and does have sensation of bladder fullness.  Bladder scan with an estimated volume of >999 mL ?Foley catheter was replaced with return of 1550 mL of clear urine ?With prior history of bilateral hydronephrosis and acute renal failure this is a chronic process ?I recommended scheduling a urodynamic study to assess detrusor end to see if he would be a candidate for an outlet procedure ? ? ?Abbie Sons, MD ? ?[facility] ?[address] ?[place], [place] [address] ?[phone] ? ?

## 2022-03-16 ENCOUNTER — Encounter: Payer: Self-pay | Admitting: Urology

## 2022-03-17 NOTE — Progress Notes (Signed)
Duplicate appointment 

## 2022-03-22 ENCOUNTER — Ambulatory Visit: Payer: Medicare PPO | Admitting: *Deleted

## 2022-03-22 ENCOUNTER — Other Ambulatory Visit: Payer: Self-pay

## 2022-03-22 DIAGNOSIS — R339 Retention of urine, unspecified: Secondary | ICD-10-CM

## 2022-03-22 NOTE — Progress Notes (Signed)
Patient came in today and his catheter was hanging out of him. I replaced the catheter and got 352m of urine.  ? ?Simple Catheter Placement ? ?Due to urinary retention patient is present today for a foley cath placement.  Patient was cleaned and prepped in a sterile fashion with betadine. A 16 FR foley catheter was inserted, urine return was noted  3065m urine was light yellow  in color.  The balloon was filled with 10cc of sterile water.  A leg bag was attached for drainage. Patient was also given a night bag to take home and was given instruction on how to change from one bag to another.  Patient was given instruction on proper catheter care.  Patient tolerated well, no complications were noted  ? ?Performed by: JeGaspar ColaMA  ? ?

## 2022-04-02 ENCOUNTER — Other Ambulatory Visit: Payer: Self-pay

## 2022-04-02 ENCOUNTER — Emergency Department
Admission: EM | Admit: 2022-04-02 | Discharge: 2022-04-02 | Disposition: A | Discharge status: Home / Self Care | Payer: Medicare PPO | Attending: Emergency Medicine | Admitting: Emergency Medicine

## 2022-04-02 DIAGNOSIS — E119 Type 2 diabetes mellitus without complications: Secondary | ICD-10-CM | POA: Insufficient documentation

## 2022-04-02 DIAGNOSIS — T83511A Infection and inflammatory reaction due to indwelling urethral catheter, initial encounter: Secondary | ICD-10-CM | POA: Insufficient documentation

## 2022-04-02 DIAGNOSIS — T839XXA Unspecified complication of genitourinary prosthetic device, implant and graft, initial encounter: Secondary | ICD-10-CM | POA: Diagnosis present

## 2022-04-02 DIAGNOSIS — I10 Essential (primary) hypertension: Secondary | ICD-10-CM | POA: Diagnosis not present

## 2022-04-02 DIAGNOSIS — E039 Hypothyroidism, unspecified: Secondary | ICD-10-CM | POA: Diagnosis not present

## 2022-04-02 DIAGNOSIS — N390 Urinary tract infection, site not specified: Secondary | ICD-10-CM

## 2022-04-02 LAB — URINALYSIS, ROUTINE W REFLEX MICROSCOPIC
Bilirubin Urine: NEGATIVE
Glucose, UA: NEGATIVE mg/dL
Ketones, ur: NEGATIVE mg/dL
Nitrite: POSITIVE — AB
Protein, ur: NEGATIVE mg/dL
Specific Gravity, Urine: 1.013 (ref 1.005–1.030)
Squamous Epithelial / HPF: NONE SEEN (ref 0–5)
pH: 9 — ABNORMAL HIGH (ref 5.0–8.0)

## 2022-04-02 MED ORDER — CEPHALEXIN 500 MG PO CAPS
500.0000 mg | ORAL_CAPSULE | Freq: Once | ORAL | Status: AC
  Administered 2022-04-02: 500 mg via ORAL
  Filled 2022-04-02: qty 1

## 2022-04-02 MED ORDER — CEPHALEXIN 500 MG PO CAPS: 500.0000 mg | ORAL_CAPSULE | Freq: Four times a day (QID) | ORAL | 0 refills | Status: AC

## 2022-04-02 NOTE — ED Triage Notes (Signed)
Pt arrives with a catheter in place on arrival, states he does not think it is draining properly and is having flank pain. States he called his urologist and the office was closed today. ?

## 2022-04-02 NOTE — ED Provider Notes (Signed)
? ?[facility] ?Provider Note ? ? ? Event Date/Time  ? First MD Initiated Contact with Patient 04/02/22 1224   ?  (approximate) ? ? ?History  ? ?urinary catheter problems ? ? ?HPI ? ?Alan Holt is a 71 y.o. male with past medical history of urinary retention causing AKI, hypertension diabetes who presents with nondraining Foley catheter.  He was hospitalized on 3/7 for bladder outlet obstruction causing AKI and severe hydronephrosis.  Foley catheter has been in place since that time.  Failed trial of void with urology.  Last Monday about 5 days ago patient had a urodynamic study by urologist in [place].  Foley catheter was removed for this and then replaced that day.  Was draining fine last night and throughout the day yesterday.  When he woke up this morning he had little in his bladder and started having some left-sided flank pain.  Denies fevers chills nausea vomiting.  Patient now after Foley has been replaced feels much improved. ?  ? ?Past Medical History:  ?Diagnosis Date  ? Allergy   ? Diabetes mellitus without complication ([place])   ? Dysplastic nevus 02/25/2014  ? L mid ant thigh - mild  ? Dysplastic nevus 02/25/2014  ? R med popliteal - mild  ? Dysplastic nevus 02/25/2014  ? R med knee - mild  ? Dysplastic nevus 12/30/2016  ? R pectoral 2.0 cm med to areola - mod  ? Dysplastic nevus 12/30/2016  ? L dorsum prox forearm near antecubital - mod  ? Elevated BP without diagnosis of hypertension 11/10/2016  ? Hypertension   ? Prediabetes 05/10/2018  ? Sleep apnea   ? Thyroid disease   ? ? ?Patient Active Problem List  ? Diagnosis Date Noted  ? 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  ? Hypokalemia 03/03/2022  ? Hypomagnesemia 03/03/2022  ? AKI (acute kidney injury) ([place]) 03/02/2022  ? Severe bilateral hydroureteronephrosis 03/02/2022  ? BPH (benign prostatic hyperplasia) 03/02/2022  ? Hypercalcemia 03/02/2022  ? HTN (hypertension) 03/02/2022  ? Hematuria 03/02/2022  ? Renal calculus,  right, Punctate nonobstructing  03/02/2022  ? Abdominal mass, RLQ (right lower quadrant) 03/01/2022  ? Uncontrolled type 2 diabetes mellitus with hyperglycemia ([place]) 03/11/2021  ? Elevated hemoglobin ([place]) 03/11/2021  ? DM2 (diabetes mellitus, type 2) ([place]) 02/06/2021  ? Sleep apnea 06/01/2018  ? Prediabetes 05/10/2018  ? Increased prostate specific antigen (PSA) velocity 05/08/2018  ? Obesity (BMI 30.0-34.9) 05/08/2018  ? Hyperlipidemia with low HDL 08/10/2016  ? Low testosterone 07/20/2016  ? Hypothyroidism 07/09/2015  ? ? ? ?Physical Exam  ?Triage Vital Signs: ?ED Triage Vitals [04/02/22 1218]  ?Enc Vitals Group  ?   BP (!) 164/90  ?   Pulse Rate 65  ?   Resp 17  ?   Temp 98.2 ?F (36.8 ?C)  ?   Temp Source Oral  ?   SpO2 98 %  ?   Weight   ?   Height   ?   Head Circumference   ?   Peak Flow   ?   Pain Score   ?   Pain Loc   ?   Pain Edu?   ?   Excl. in [place]?   ? ? ?Most recent vital signs: ?Vitals:  ? 04/02/22 1218  ?BP: (!) 164/90  ?Pulse: 65  ?Resp: 17  ?Temp: 98.2 ?F (36.8 ?C)  ?SpO2: 98%  ? ? ? ?General: Awake, no distress.  ?CV:  Good peripheral perfusion.  ?Resp:  Normal effort.  ?  Abd:  No distention.  Soft and nontender throughout, no CVA tenderness ?Neuro:             Awake, Alert, Oriented x 3  ?Other:  Foley catheter which was replaced draining clear yellow urine ? ? ?ED Results / Procedures / Treatments  ?Labs ?(all labs ordered are listed, but only abnormal results are displayed) ?Labs Reviewed  ?URINALYSIS, ROUTINE W REFLEX MICROSCOPIC - Abnormal; Notable for the following components:  ?    Result Value  ? Color, Urine YELLOW (*)   ? APPearance CLOUDY (*)   ? pH 9.0 (*)   ? Hgb urine dipstick SMALL (*)   ? Nitrite POSITIVE (*)   ? Leukocytes,Ua LARGE (*)   ? Bacteria, UA MANY (*)   ? All other components within normal limits  ?URINE CULTURE  ? ? ? ?EKG ? ? ? ? ?RADIOLOGY ? ? ? ?PROCEDURES: ? ?Critical Care performed: No ? ?Procedures ? ? ? ?MEDICATIONS ORDERED IN ED: ?Medications  ?cephALEXin (KEFLEX)  capsule 500 mg (has no administration in time range)  ? ? ? ?IMPRESSION / MDM / ASSESSMENT AND PLAN / ED COURSE  ?I reviewed the triage vital signs and the nursing notes. ?             ?               ? ?Differential diagnosis includes, but is not limited to, displaced Foley catheter, clogged tubing, AKI, hydronephrosis ? ?Patient is a 71 year old male with recent admission for bladder outlet obstruction requiring Foley catheter placement who has failed trial of void as an outpatient presents today because his Foley catheter is not draining.  Woke up with minimal urine in the bag.  Yesterday during the day and evening have been draining fine.  Suspect sometime overnight it stops draining.  Did have some mild left flank pain developing today.  No fevers chills nausea or vomiting.  Bladder scan showed about 700 cc of urine in the bladder so Foley catheter was removed and replaced with return of yellow urine.  Patient feeling much improved after Foley replaced.  I looked at the tubing did not see any obvious cause for the obstruction.  We will send a UA.  Ultimately given he is not systemically ill and had less than a liter in his bladder do not feel the need to recheck BMP or CBC.  We will ensure UTI was not the cause. ? ? ?UA is nitrite positive with many WBCs.  We will treat as complicated UTI with 7 days of Keflex.  Discussed return precautions for fever. We will have him follow-up with urology as scheduled. ?  ? ? ?FINAL CLINICAL IMPRESSION(S) / ED DIAGNOSES  ? ?Final diagnoses:  ?Urinary tract infection associated with indwelling urethral catheter, initial encounter ([place])  ?Problem with Foley catheter, initial encounter ([place])  ? ? ? ?Rx / DC Orders  ? ?ED Discharge Orders   ? ?      Ordered  ?  cephALEXin (KEFLEX) 500 MG capsule  4 times daily       ? 04/02/22 1426  ? ?  ?  ? ?  ? ? ? ?Note:  This document was prepared using Dragon voice recognition software and may include unintentional dictation errors. ?   ?Rada Hay, MD ?04/02/22 1427 ? ?

## 2022-04-02 NOTE — ED Notes (Signed)
Pt states his cathter was working fine last night but when he woke up this morning it was no longer draining. Pt complains of pain in his groin and lower back. ?

## 2022-04-02 NOTE — Discharge Instructions (Addendum)
You likely have a urinary tract infection which may have been why the Foley catheter was not working.  Please take the antibiotic 4 times a day for the next 7 days and follow-up with urology.  If you develop fevers nausea vomiting or flank pain, please return to the emergency department. ?

## 2022-04-02 NOTE — ED Notes (Signed)
Pt gave verbal consent to discharge.  ? ?

## 2022-04-05 LAB — URINE CULTURE: Culture: >=100000 — AB

## 2022-04-06 ENCOUNTER — Telehealth: Payer: Self-pay | Admitting: Urology

## 2022-04-06 NOTE — Telephone Encounter (Signed)
Patient's wife called the office today looking for results from patient's urodynamics study.  (Results are on Dr. Dene Gentry desk for review.) ? ?Would you like the patient to come into the office to discuss? Note from 03/15/22 states: "I recommended scheduling a urodynamic study to assess detrusor end to see if he would be a candidate for an outlet procedure" ? ?Please advise. ? ? ?

## 2022-04-07 ENCOUNTER — Encounter: Payer: Self-pay | Admitting: Urology

## 2022-04-07 NOTE — Telephone Encounter (Signed)
Results reviewed and notified via [place].  Will review results with Dr. Matilde Sprang and be back in touch with him next week ?

## 2022-04-16 ENCOUNTER — Other Ambulatory Visit: Payer: Self-pay | Admitting: Urology

## 2022-04-16 DIAGNOSIS — N312 Flaccid neuropathic bladder, not elsewhere classified: Secondary | ICD-10-CM

## 2022-04-16 DIAGNOSIS — R339 Retention of urine, unspecified: Secondary | ICD-10-CM

## 2022-04-20 ENCOUNTER — Other Ambulatory Visit: Payer: Self-pay | Admitting: Urology

## 2022-04-20 ENCOUNTER — Ambulatory Visit: Payer: Medicare PPO | Admitting: Physician Assistant

## 2022-04-20 DIAGNOSIS — N312 Flaccid neuropathic bladder, not elsewhere classified: Secondary | ICD-10-CM

## 2022-04-20 DIAGNOSIS — R3581 Nocturnal polyuria: Secondary | ICD-10-CM

## 2022-04-20 LAB — BLADDER SCAN AMB NON-IMAGING: Scan Result: 590

## 2022-04-20 NOTE — Progress Notes (Signed)
? ?04/20/2022 ?9:59 AM  ? ?Alan Holt ?[birth year] ?[id number] ? ?CC: ?Chief Complaint  ?Patient presents with  ? Urinary Retention  ? ?HPI: ?Alan Holt is a 71 y.o. male with PMH diabetes and OSA on CPAP recent history of urinary retention unable to generate voluntary bladder contraction on urodynamics who presents today for voiding trial.  He is accompanied today by his wife, who contributes to HPI. ? ?Today he reports he has been having intermittent sensations of urinary urgency while his catheter has been in place.  He is unsure if this means he would be able to void on his own without catheter.  He has noticed that he produces approximately 1 L of urine overnight, which seems like a comparatively large volume compared to his daytime output.  He notes he had significant nocturia prior to his recent episode of urinary retention. ? ?He understands that based on his urodynamics results, he is unlikely to pass a voiding trial today.  He is curious about general catheter maintenance.  He has been referred to [facility] for [facility] opinion. ? ?Foley catheter removed in the morning, see procedure note below for further details.  He returned to clinic in the afternoon.  He reports drinking approximately 24oz of fluid.  He has been able to urinate and reports several short streams.  PVR 573m. ? ?PMH: ?Past Medical History:  ?Diagnosis Date  ? Allergy   ? Diabetes mellitus [place] complication ([place]   ? Dysplastic nevus 02/25/2014  ? L mid ant thigh - mild  ? Dysplastic nevus 02/25/2014  ? R med popliteal - mild  ? Dysplastic nevus 02/25/2014  ? R med knee - mild  ? Dysplastic nevus 12/30/2016  ? R pectoral 2.0 cm med to areola - mod  ? Dysplastic nevus 12/30/2016  ? L dorsum prox forearm near antecubital - mod  ? Elevated BP without diagnosis of hypertension 11/10/2016  ? Hypertension   ? Prediabetes 05/10/2018  ? Sleep apnea   ? Thyroid disease   ? ? ?Surgical History: ?Past Surgical History:  ?Procedure Laterality Date   ? HERNIA REPAIR    ? childhood  ? KNEE SURGERY Left 1993  ? MOUTH SURGERY  04/05/2018  ? bone graft, 3 teeth pulled  ? THYROID SURGERY  2015  ? TOTAL THYROIDECTOMY Bilateral 07/15/2014  ? Dr. JKathyrn Sheriff   ? ? ?Home Medications:  ?Allergies as of 04/20/2022   ? ?   Reactions  ? Statins   ? Elevated CK with dark urine  ? Erythromycin Rash  ? ?  ? ?  ?Medication List  ?  ? ?  ? Accurate as of April 20, 2022  9:59 AM. If you have any questions, ask your nurse or doctor.  ?  ?  ? ?  ? ?Accu-Chek Guide w/Device Kit ?See admin instructions. ?  ?acetaminophen 325 MG tablet ?Commonly known as: TYLENOL ?Take 2 tablets (650 mg total) by mouth every 6 (six) hours as needed for moderate pain, mild pain or fever. ?  ?BLOOD GLUCOSE TEST STRIPS Strp ?Use as directed to monitor FSBS once daily prn. Dx: E11.65 ?  ?finasteride 5 MG tablet ?Commonly known as: PROSCAR ?Take 1 tablet (5 mg total) by mouth daily. ?  ?Lancets Misc ?Use as directed to monitor FSBS once daily prn. Dx: E11.65 ?  ?levothyroxine 150 MCG tablet ?Commonly known as: SYNTHROID ?Take 1 tablet by mouth daily. ?  ?polyethylene glycol 17 g packet ?Commonly known as: MIRALAX / GLYCOLAX ?Take  17 g by mouth daily as needed for mild constipation or moderate constipation. ?  ?tamsulosin 0.4 MG Caps capsule ?Commonly known as: FLOMAX ?Take 1 capsule (0.4 mg total) by mouth daily. ?  ? ?  ? ? ?Allergies:  ?Allergies  ?Allergen Reactions  ? Statins   ?  Elevated CK with dark urine  ? Erythromycin Rash  ? ? ?Family History: ?Family History  ?Problem Relation Age of Onset  ? Alcohol abuse Father   ? Stroke Father   ? Diabetes Mother   ? Diabetes Brother   ? Prostate cancer Brother   ? Heart attack Brother   ? ? ?Social History:  ? reports that he has never smoked. He has never used smokeless tobacco. He reports that he does not drink alcohol and does not use drugs. ? ?Physical Exam: ?There were no vitals taken for this visit.  ?Constitutional:  Alert and oriented, no acute  distress, nontoxic appearing ?HEENT: [place], AT ?Cardiovascular: No clubbing, cyanosis, or edema ?Respiratory: Normal respiratory effort, no increased work of breathing ?Skin: No rashes, bruises or suspicious lesions ?Neurologic: Grossly intact, no focal deficits, moving all 4 extremities ?Psychiatric: Normal mood and affect ? ?Laboratory Data: ?Results for orders placed or performed in visit on 04/20/22  ?BLADDER SCAN AMB NON-IMAGING  ?Result Value Ref Range  ? Scan Result 590   ? ?Catheter Removal ? ?Patient is present today for a catheter removal.  46m of water was drained from the balloon. A 16FR foley cath was removed from the bladder no complications were noted . Patient tolerated well. ? ?Performed by: SDebroah Loop PA-C  ? ?Simple Catheter Placement ? ?Due to urinary retention patient is present today for a foley cath placement.  Patient was cleaned and prepped in a sterile fashion with betadine. A 16 FR Silastic foley catheter was inserted, urine return was noted  6548m urine was yellow in color.  The balloon was filled with 10cc of sterile water.  A leg bag was attached for drainage. Patient was also given a night bag to take home and was given instruction on how to change from one bag to another.  Patient was given instruction on proper catheter care.  Patient tolerated well, no complications were noted  ? ?Performed by: SaDebroah LoopPA-C  ? ?Assessment & Plan:   ?1. Hypotonic bladder ?I had a lengthy conversation with the patient and his wife today regarding Foley catheter care and maintenance.  We discussed alternatives including CIC and suprapubic catheter.  Patient wishes to continue with indwelling urethral Foley catheter for the time being, which is reasonable. ? ?Voiding trial failed today.  Foley catheter replaced in the afternoon, see procedure note above. ?- BLADDER SCAN AMB NON-IMAGING ? ?2. Nocturnal polyuria ?Likely secondary to OSA, compliant with CPAP.  Patient expressed  understanding.  No further intervention indicated. ? ?Return in about 4 weeks (around 05/18/2022) for Catheter exchange. ? ?SaDebroah LoopPA-C ? ?I spent 35 minutes on the day of the encounter to include pre-visit record review, face-to-face time with the patient, and post-visit ordering of tests.  ? ?[place] ?[place] [phone]

## 2022-04-23 ENCOUNTER — Encounter: Payer: Self-pay | Admitting: Internal Medicine

## 2022-04-23 ENCOUNTER — Ambulatory Visit (INDEPENDENT_AMBULATORY_CARE_PROVIDER_SITE_OTHER): Payer: Medicare PPO | Admitting: Internal Medicine

## 2022-04-23 VITALS — BP 118/78 | HR 83 | Temp 98.2°F | Resp 18 | Ht 74.0 in

## 2022-04-23 DIAGNOSIS — E782 Mixed hyperlipidemia: Secondary | ICD-10-CM | POA: Diagnosis not present

## 2022-04-23 DIAGNOSIS — E039 Hypothyroidism, unspecified: Secondary | ICD-10-CM

## 2022-04-23 DIAGNOSIS — Z23 Encounter for immunization: Secondary | ICD-10-CM | POA: Diagnosis not present

## 2022-04-23 DIAGNOSIS — N312 Flaccid neuropathic bladder, not elsewhere classified: Secondary | ICD-10-CM

## 2022-04-23 DIAGNOSIS — G4730 Sleep apnea, unspecified: Secondary | ICD-10-CM | POA: Diagnosis not present

## 2022-04-23 DIAGNOSIS — E119 Type 2 diabetes mellitus without complications: Secondary | ICD-10-CM | POA: Diagnosis not present

## 2022-04-23 NOTE — Patient Instructions (Addendum)
It was great seeing you today! ? ?Plan discussed at today's visit: ?-Blood work ordered today, results will be uploaded to [facility].  ?-Pneumonia vaccine today ? ?Follow up in: 6 months  ? ?Take care and let us know if you have any questions or concerns prior to your next visit. ? ?Dr. Rosana Berger ? ?

## 2022-04-23 NOTE — Assessment & Plan Note (Signed)
Following with urology, last note reviewed from 04/20/2022.  He does have a indwelling urethral Foley placed currently.  He continues to be on Flomax 0.4 mg daily and finasteride 5 mg daily. ?

## 2022-04-23 NOTE — Progress Notes (Signed)
? ?Established Patient Office Visit ? ?Subjective   ?Patient ID: PATON CRUM, male    DOB: [birth year]  Age: 71 y.o. MRN: [id number] ? ?Chief Complaint  ?Patient presents with  ? Follow-up  ? Hypothyroidism  ? ? ?HPI ?Alan Holt is a 71 year old male here for follow-up on chronic medical conditions. ? ?OSA: ?-Compliant with CPAP ? ?HLD: ?-Medications: Niacin, history of rhabdomyolysis on statin last fall ?-Last lipid panel: Lipid Panel  ?   ?Component Value Date/Time  ? CHOL 147 04/22/2021 0830  ? CHOL 151 01/12/2016 0853  ? TRIG 163 (H) 04/22/2021 0830  ? HDL 39 (L) 04/22/2021 0830  ? HDL 37 (L) 01/12/2016 0853  ? CHOLHDL 3.8 04/22/2021 0830  ? VLDL 27 05/03/2017 0906  ? [place] 82 04/22/2021 0830  ? LABVLDL 36 01/12/2016 0853  ? ?The 10-year ASCVD risk score (Arnett DK, et al., 2019) is: 43.8% ?  Values used to calculate the score: ?    Age: 37 years ?    Sex: Male ?    Is Non-Hispanic African American: No ?    Diabetic: Yes ?    Tobacco smoker: No ?    Systolic Blood Pressure: 195 mmHg ?    Is BP treated: No ?    HDL Cholesterol: 39 mg/dL ?    Total Cholesterol: 147 mg/dL ? ?Diabetes, Type 2: ?-Last A1c 3/23 6.4% ?-Medications: None, diet controlled ?-Checking BG at home: 125 post-prandial yesterday  ?-Eye exam: 9/22 [facility]  ?-Foot exam: Due today  ?-Microalbumin: Due ?-Statin: No, history of intolerance  ?-PNA vaccine: Due for Prevnar 20 ?-Denies symptoms of hypoglycemia, polyuria, polydipsia, numbness extremities, foot ulcers/trauma.  ? ?Hypothyroidism: ?-Medications: Levothyroxine 150 mcg ?-S/p thyroidectomy in 2015, enlarged with calcifications  ?-Patient is compliant with the above medication (s) at the above dose and reports no medication side effects.  ?-Denies weight changes, cold./heat intolerance, skin changes, anxiety/palpitations  ?-Last TSH: Uncertain, follows with Endocrinology, seeing them in September.  Unable to view note. ? ?BPH/Bladder Outlet obstruction/hypotonic bladder: ?-Following  with urology, last note reviewed from 04/20/2022; has indwelling urethral Foley placed ?-Planning on returning to urology in about 4 weeks for catheter exchange ?-Currently on Flomax and finasteride 5 mg ? ?Health maintenance: ?-Blood work due ? ? ?Review of Systems  ?Constitutional:  Negative for chills and fever.  ?Respiratory:  Negative for shortness of breath.   ?Cardiovascular:  Negative for chest pain.  ?Genitourinary:  Negative for dysuria and hematuria.  ?Musculoskeletal:  Negative for myalgias.  ?Neurological:  Negative for dizziness and tingling.  ? ?  ?Objective:  ?  ? ?BP 118/78   Pulse 83   Temp 98.2 ?F (36.8 ?C)   Resp 18   Ht '6\' 2"'$  (1.88 m)   SpO2 97%   BMI 27.60 kg/m?  ?BP Readings from Last 3 Encounters:  ?04/23/22 118/78  ?04/02/22 (!) 158/76  ?03/15/22 136/76  ? ?Wt Readings from Last 3 Encounters:  ?03/15/22 215 lb (97.5 kg)  ?03/08/22 217 lb 8 oz (98.7 kg)  ?03/02/22 220 lb 3.8 oz (99.9 kg)  ? ?  ? ?Physical Exam ?Constitutional:   ?   Appearance: Normal appearance.  ?HENT:  ?   Head: Normocephalic and atraumatic.  ?Eyes:  ?   Conjunctiva/sclera: Conjunctivae normal.  ?Cardiovascular:  ?   Rate and Rhythm: Normal rate and regular rhythm.  ?   Pulses:     ?     Dorsalis pedis pulses are 2+ on the  right side and 2+ on the left side.  ?Pulmonary:  ?   Effort: Pulmonary effort is normal.  ?   Breath sounds: Normal breath sounds.  ?Genitourinary: ?   Comments: Foley present ?Musculoskeletal:  ?   Right lower leg: No edema.  ?   Left lower leg: No edema.  ?   Right foot: Normal range of motion. No deformity, bunion, Charcot foot, foot drop or prominent metatarsal heads.  ?   Left foot: Normal range of motion. No deformity, bunion, Charcot foot, foot drop or prominent metatarsal heads.  ?Feet:  ?   Right foot:  ?   Protective Sensation: 7 sites tested.  7 sites sensed.  ?   Skin integrity: Skin integrity normal.  ?   Toenail Condition: Right toenails are normal.  ?   Left foot:  ?   Protective  Sensation: 7 sites tested.  7 sites sensed.  ?   Skin integrity: Skin integrity normal.  ?   Toenail Condition: Left toenails are normal.  ?Skin: ?   General: Skin is warm and dry.  ?Neurological:  ?   General: No focal deficit present.  ?   Mental Status: He is alert. Mental status is at baseline.  ?Psychiatric:     ?   Mood and Affect: Mood normal.     ?   Behavior: Behavior normal.  ? ? ? ?No results found for any visits on 04/23/22. ? ?Last CBC ?Lab Results  ?Component Value Date  ? WBC 8.0 03/04/2022  ? HGB 13.1 03/04/2022  ? HCT 38.0 (L) 03/04/2022  ? MCV 87.2 03/04/2022  ? MCH 30.0 03/04/2022  ? RDW 12.2 03/04/2022  ? PLT 220 03/04/2022  ? ?Last metabolic panel ?Lab Results  ?Component Value Date  ? GLUCOSE 108 (H) 03/08/2022  ? NA 141 03/08/2022  ? K 3.8 03/08/2022  ? CL 107 03/08/2022  ? CO2 27 03/08/2022  ? BUN 24 03/08/2022  ? CREATININE 1.42 (H) 03/08/2022  ? GFRNONAA 39 (L) 03/04/2022  ? CALCIUM 9.1 03/08/2022  ? PROT 7.3 03/02/2022  ? ALBUMIN 4.2 03/02/2022  ? LABGLOB 2.2 01/12/2016  ? AGRATIO 2.0 01/12/2016  ? BILITOT 0.8 03/02/2022  ? ALKPHOS 64 03/02/2022  ? AST 22 03/02/2022  ? ALT 26 03/02/2022  ? ANIONGAP 5 03/04/2022  ? ?Last lipids ?Lab Results  ?Component Value Date  ? CHOL 147 04/22/2021  ? HDL 39 (L) 04/22/2021  ? [place] 82 04/22/2021  ? TRIG 163 (H) 04/22/2021  ? CHOLHDL 3.8 04/22/2021  ? ?Last hemoglobin A1c ?Lab Results  ?Component Value Date  ? HGBA1C 6.4 (H) 03/01/2022  ? ?Last thyroid functions ?No results found for: TSH, T3TOTAL, T4TOTAL, THYROIDAB ?Last vitamin D ?Lab Results  ?Component Value Date  ? VD25OH 51 02/01/2018  ? ?Last vitamin B12 and Folate ?No results found for: VITAMINB12, FOLATE ?  ? ?The 10-year ASCVD risk score (Arnett DK, et al., 2019) is: 43.8% ? ?  ?Assessment & Plan:  ? ?Problem List Items Addressed This Visit   ? ?  ? Respiratory  ? Sleep apnea  ?  Stable.  Compliant with CPAP.  Blood pressure well controlled. ? ?  ?  ?  ? Endocrine  ? Hypothyroidism  ?   Following with endocrinology.  Currently on levothyroxine 150 mcg daily. ? ?  ?  ? DM2 (diabetes mellitus, type 2) ([place]) - Primary  ?  Reviewed last A1c from 3/23, 6.4%.  Not on medications, diet controlled.  Foot exam due today.  Continue to monitor. ? ?  ?  ? Relevant Orders  ? COMPLETE METABOLIC PANEL WITH GFR  ? HM Diabetes Foot Exam (Completed)  ?  ? Genitourinary  ? Hypotonic bladder  ?  Following with urology, last note reviewed from 04/20/2022.  He does have a indwelling urethral Foley placed currently.  He continues to be on Flomax 0.4 mg daily and finasteride 5 mg daily. ? ?  ?  ?  ? Other  ? Mixed hyperlipidemia  ?  History of statin intolerance secondary to rhabdomyolysis that occurred in the fall 2022.  For this reason he can no longer be on statin therapy.  In the past his lipid panels have been well controlled while on medication.  Today we will check a fasting lipid panel.  If elevated, consider pursuing Repatha. ? ?  ?  ? Relevant Medications  ? niacin (NIASPAN) 500 MG CR tablet  ? Other Relevant Orders  ? Lipid Profile  ? ?Other Visit Diagnoses   ? ? Vaccine for streptococcus pneumoniae and influenza      ? Relevant Orders  ? Pneumococcal conjugate vaccine 20-valent (Prevnar 20)  ? ?  ? ? ?Return in about 6 months (around 10/23/2022).  ? ? ?Teodora Medici, DO ? ?

## 2022-04-23 NOTE — Assessment & Plan Note (Signed)
Reviewed last A1c from 3/23, 6.4%.  Not on medications, diet controlled.  Foot exam due today.  Continue to monitor. ?

## 2022-04-23 NOTE — Assessment & Plan Note (Signed)
Stable.  Compliant with CPAP.  Blood pressure well controlled. ?

## 2022-04-23 NOTE — Assessment & Plan Note (Signed)
History of statin intolerance secondary to rhabdomyolysis that occurred in the fall 2022.  For this reason he can no longer be on statin therapy.  In the past his lipid panels have been well controlled while on medication.  Today we will check a fasting lipid panel.  If elevated, consider pursuing Repatha. ?

## 2022-04-23 NOTE — Assessment & Plan Note (Signed)
Following with endocrinology.  Currently on levothyroxine 150 mcg daily. ?

## 2022-04-24 LAB — COMPLETE METABOLIC PANEL WITH GFR
AG Ratio: 1.7 (calc) (ref 1.0–2.5)
ALT: 25 U/L (ref 9–46)
AST: 19 U/L (ref 10–35)
Albumin: 4.3 g/dL (ref 3.6–5.1)
Alkaline phosphatase (APISO): 73 U/L (ref 35–144)
BUN: 18 mg/dL (ref 7–25)
CO2: 29 mmol/L (ref 20–32)
Calcium: 9.6 mg/dL (ref 8.6–10.3)
Chloride: 105 mmol/L (ref 98–110)
Creat: 1.03 mg/dL (ref 0.70–1.28)
Globulin: 2.5 g/dL (calc) (ref 1.9–3.7)
Glucose, Bld: 128 mg/dL — ABNORMAL HIGH (ref 65–99)
Potassium: 4.4 mmol/L (ref 3.5–5.3)
Sodium: 140 mmol/L (ref 135–146)
Total Bilirubin: 0.6 mg/dL (ref 0.2–1.2)
Total Protein: 6.8 g/dL (ref 6.1–8.1)
eGFR: 78 mL/min/{1.73_m2} (ref >=60)

## 2022-04-24 LAB — LIPID PANEL
Cholesterol: 150 mg/dL (ref <200)
HDL: 39 mg/dL — ABNORMAL LOW (ref >=40)
LDL Cholesterol (Calc): 86 mg/dL (calc)
Non-HDL Cholesterol (Calc): 111 mg/dL (calc) (ref <130)
Total CHOL/HDL Ratio: 3.8 (calc) (ref <5.0)
Triglycerides: 146 mg/dL (ref <150)

## 2022-04-26 ENCOUNTER — Encounter: Payer: Self-pay | Admitting: Emergency Medicine

## 2022-04-26 DIAGNOSIS — N390 Urinary tract infection, site not specified: Secondary | ICD-10-CM | POA: Insufficient documentation

## 2022-04-26 DIAGNOSIS — T83091A Other mechanical complication of indwelling urethral catheter, initial encounter: Secondary | ICD-10-CM | POA: Insufficient documentation

## 2022-04-26 DIAGNOSIS — R339 Retention of urine, unspecified: Secondary | ICD-10-CM | POA: Diagnosis not present

## 2022-04-26 DIAGNOSIS — I10 Essential (primary) hypertension: Secondary | ICD-10-CM | POA: Diagnosis not present

## 2022-04-26 DIAGNOSIS — E119 Type 2 diabetes mellitus without complications: Secondary | ICD-10-CM | POA: Insufficient documentation

## 2022-04-26 LAB — BASIC METABOLIC PANEL
Anion gap: 12 (ref 5–15)
BUN: 25 mg/dL — ABNORMAL HIGH (ref 8–23)
CO2: 24 mmol/L (ref 22–32)
Calcium: 9.3 mg/dL (ref 8.9–10.3)
Chloride: 101 mmol/L (ref 98–111)
Creatinine, Ser: 1.23 mg/dL (ref 0.61–1.24)
GFR, Estimated: >60 mL/min (ref >60)
Glucose, Bld: 161 mg/dL — ABNORMAL HIGH (ref 70–99)
Potassium: 3.4 mmol/L — ABNORMAL LOW (ref 3.5–5.1)
Sodium: 137 mmol/L (ref 135–145)

## 2022-04-26 LAB — CBC
HCT: 41.6 % (ref 39.0–52.0)
Hemoglobin: 14 g/dL (ref 13.0–17.0)
MCH: 29.9 pg (ref 26.0–34.0)
MCHC: 33.7 g/dL (ref 30.0–36.0)
MCV: 88.7 fL (ref 80.0–100.0)
Platelets: 230 10*3/uL (ref 150–400)
RBC: 4.69 MIL/uL (ref 4.22–5.81)
RDW: 13 % (ref 11.5–15.5)
WBC: 9.5 10*3/uL (ref 4.0–10.5)
nRBC: 0 % (ref 0.0–0.2)

## 2022-04-26 MED ORDER — EZETIMIBE 10 MG PO TABS: 10.0000 mg | ORAL_TABLET | Freq: Every day | ORAL | 1 refills | Status: DC

## 2022-04-26 NOTE — ED Notes (Signed)
Bladder scan -27m ?

## 2022-04-26 NOTE — ED Triage Notes (Signed)
Pt presents via POV with complaints of a urinary retention - Pt has a foley in place that was put in 7 days ago at his urologist and he hasn't had any in output in the last several hours but has the urge to urinate. Denies CP or SOB.  ?

## 2022-04-26 NOTE — Addendum Note (Signed)
Addended by: Teodora Medici on: 04/26/2022 10:44 AM ? ? Modules accepted: Orders ? ?

## 2022-04-26 NOTE — ED Notes (Addendum)
Attempt made to irrigate the patients foley - attempt unsuccessful. Pt endorses the sensation to void but is unable to.  ?

## 2022-04-27 ENCOUNTER — Emergency Department
Admission: EM | Admit: 2022-04-27 | Discharge: 2022-04-27 | Disposition: A | Discharge status: Home / Self Care | Payer: Medicare PPO | Attending: Emergency Medicine | Admitting: Emergency Medicine

## 2022-04-27 DIAGNOSIS — R339 Retention of urine, unspecified: Secondary | ICD-10-CM

## 2022-04-27 DIAGNOSIS — N390 Urinary tract infection, site not specified: Secondary | ICD-10-CM

## 2022-04-27 DIAGNOSIS — T83091A Other mechanical complication of indwelling urethral catheter, initial encounter: Secondary | ICD-10-CM

## 2022-04-27 LAB — URINALYSIS, ROUTINE W REFLEX MICROSCOPIC
Bacteria, UA: NONE SEEN
Bilirubin Urine: NEGATIVE
Glucose, UA: NEGATIVE mg/dL
Ketones, ur: NEGATIVE mg/dL
Nitrite: NEGATIVE
Protein, ur: 100 mg/dL — AB
RBC / HPF: >50 RBC/hpf — ABNORMAL HIGH (ref 0–5)
Specific Gravity, Urine: 1.015 (ref 1.005–1.030)
Squamous Epithelial / HPF: NONE SEEN (ref 0–5)
WBC, UA: >50 WBC/hpf — ABNORMAL HIGH (ref 0–5)
pH: 5 (ref 5.0–8.0)

## 2022-04-27 MED ORDER — CEPHALEXIN 500 MG PO CAPS
500.0000 mg | ORAL_CAPSULE | Freq: Once | ORAL | Status: AC
  Administered 2022-04-27: 500 mg via ORAL
  Filled 2022-04-27: qty 1

## 2022-04-27 MED ORDER — CEPHALEXIN 500 MG PO CAPS: 500.0000 mg | ORAL_CAPSULE | Freq: Two times a day (BID) | ORAL | 0 refills | Status: AC

## 2022-04-27 NOTE — ED Notes (Signed)
Pt discharge information reviewed. Pt understands need for follow up care and when to return if symptoms worsen. All questions answered. Pt is alert and oriented with even and regular respirations. Pt is seen ambulating out of department with string steady gait.   

## 2022-04-27 NOTE — ED Provider Notes (Signed)
? ?[facility] ?Provider Note ? ? ? Event Date/Time  ? First MD Initiated Contact with Patient 04/27/22 0101   ?  (approximate) ? ? ?History  ? ?Urinary Retention ? ? ?HPI ? ?Alan Holt is a 71 y.o. male with history of hypertension, diabetes, hypotonic bladder status post Foley catheter who presents to the emergency department urinary retention.  States his Foley catheter was initially placed on March 7.  Had to come to the emergency department on April 7 after it became obstructed and had to be exchanged.  Had a UTI at that time and culture grew Proteus mirabilis.  Finished a course of antibiotics.  States he was in his normal state of health and his Foley catheter was functioning properly until tonight when he changed it to his large catheter bag.  He states that it stopped draining and he became uncomfortable.  No fevers, vomiting.  Urologist is Dr. Bernardo Heater. ? ? ?History provided by patient and wife. ? ? ? ?Past Medical History:  ?Diagnosis Date  ? Allergy   ? Diabetes mellitus without complication ([place])   ? Dysplastic nevus 02/25/2014  ? L mid ant thigh - mild  ? Dysplastic nevus 02/25/2014  ? R med popliteal - mild  ? Dysplastic nevus 02/25/2014  ? R med knee - mild  ? Dysplastic nevus 12/30/2016  ? R pectoral 2.0 cm med to [place] - mod  ? Dysplastic nevus 12/30/2016  ? L dorsum prox forearm near antecubital - mod  ? Elevated BP without diagnosis of hypertension 11/10/2016  ? Hypertension   ? Prediabetes 05/10/2018  ? Sleep apnea   ? Thyroid disease   ? ? ?Past Surgical History:  ?Procedure Laterality Date  ? HERNIA REPAIR    ? childhood  ? KNEE SURGERY Left 1993  ? MOUTH SURGERY  04/05/2018  ? bone graft, 3 teeth pulled  ? THYROID SURGERY  2015  ? TOTAL THYROIDECTOMY Bilateral 07/15/2014  ? Dr. Kathyrn Sheriff.   ? ? ?MEDICATIONS:  ?Prior to Admission medications   ?Medication Sig Start Date End Date Taking? Authorizing Provider  ?cephALEXin (KEFLEX) 500 MG capsule Take 1 capsule (500 mg  total) by mouth 2 (two) times daily for 7 days. 04/27/22 05/04/22 Yes Sanuel Ladnier, Delice Bison, DO  ?acetaminophen (TYLENOL) 325 MG tablet Take 2 tablets (650 mg total) by mouth every 6 (six) hours as needed for moderate pain, mild pain or fever. 03/04/22   Hongalgi, Lenis Dickinson, MD  ?Blood Glucose Monitoring Suppl (ACCU-CHEK GUIDE) w/Device KIT See admin instructions. 02/06/21   [provider]  ?ezetimibe (ZETIA) 10 MG tablet Take 1 tablet (10 mg total) by mouth daily. 04/26/22   Teodora Medici, DO  ?finasteride (PROSCAR) 5 MG tablet Take 1 tablet (5 mg total) by mouth daily. 03/05/22   Hongalgi, Lenis Dickinson, MD  ?Glucose Blood (BLOOD GLUCOSE TEST STRIPS) STRP Use as directed to monitor FSBS once daily prn. Dx: E11.65 02/06/21   Delsa Grana, PA-C  ?Lancets MISC Use as directed to monitor FSBS once daily prn. Dx: E11.65 02/06/21   Delsa Grana, PA-C  ?levothyroxine (SYNTHROID, LEVOTHROID) 150 MCG tablet Take 1 tablet by mouth daily. 09/20/17   [provider]  ?niacin (NIASPAN) 500 MG CR tablet Take by mouth. 03/18/22   [provider]  ?polyethylene glycol (MIRALAX / GLYCOLAX) 17 g packet Take 17 g by mouth daily as needed for mild constipation or moderate constipation. 03/04/22   Hongalgi, Lenis Dickinson, MD  ?tamsulosin (FLOMAX) 0.4 MG  CAPS capsule Take 1 capsule (0.4 mg total) by mouth daily. 03/05/22   Hongalgi, Lenis Dickinson, MD  ? ? ?Physical Exam  ? ?Triage Vital Signs: ?ED Triage Vitals  ?Enc Vitals Group  ?   BP 04/26/22 2123 (!) 145/94  ?   Pulse Rate 04/26/22 2123 82  ?   Resp 04/26/22 2123 20  ?   Temp 04/26/22 2123 98.3 ?F (36.8 ?C)  ?   Temp Source 04/26/22 2123 Oral  ?   SpO2 04/26/22 2123 98 %  ?   Weight 04/26/22 2120 219 lb (99.3 kg)  ?   Height 04/26/22 2120 _0  (1.88 m)  ?   Head Circumference --   ?   Peak Flow --   ?   Pain Score 04/26/22 2117 8  ?   Pain Loc --   ?   Pain Edu? --   ?   Excl. in [place]? --   ? ? ?Most recent vital signs: ?Vitals:  ? 04/27/22 0200 04/27/22 0230  ?BP: 125/73 130/82  ?Pulse: 70  67  ?Resp: 16   ?Temp:    ?SpO2: 98% 100%  ? ? ?CONSTITUTIONAL: Alert and oriented and responds appropriately to questions. Well-appearing; well-nourished ?HEAD: Normocephalic, atraumatic ?EYES: Conjunctivae clear, pupils appear equal, sclera nonicteric ?ENT: normal nose; moist mucous membranes ?NECK: Supple, normal ROM ?CARD: RRR; S1 and S2 appreciated; no murmurs, no clicks, no rubs, no gallops ?RESP: Normal chest excursion without splinting or tachypnea; breath sounds clear and equal bilaterally; no wheezes, no rhonchi, no rales, no hypoxia or respiratory distress, speaking full sentences ?ABD/GI: Normal bowel sounds; non-distended; soft, non-tender, no rebound, no guarding, no peritoneal signs, indwelling Foley catheter has been exchanged from triage and now has approximately 250 mL of straw-colored urine in the catheter bag ?BACK: The back appears normal ?EXT: Normal ROM in all joints; no deformity noted, no edema; no cyanosis ?SKIN: Normal color for age and race; warm; no rash on exposed skin ?NEURO: Moves all extremities equally, normal speech ?PSYCH: The patient's mood and manner are appropriate. ? ? ?ED Results / Procedures / Treatments  ? ?LABS: ?(all labs ordered are listed, but only abnormal results are displayed) ?Labs Reviewed  ?URINALYSIS, ROUTINE W REFLEX MICROSCOPIC - Abnormal; Notable for the following components:  ?    Result Value  ? Color, Urine YELLOW (*)   ? APPearance TURBID (*)   ? Hgb urine dipstick LARGE (*)   ? Protein, ur 100 (*)   ? Leukocytes,Ua LARGE (*)   ? RBC / HPF >50 (*)   ? WBC, UA >50 (*)   ? All other components within normal limits  ?BASIC METABOLIC PANEL - Abnormal; Notable for the following components:  ? Potassium 3.4 (*)   ? Glucose, Bld 161 (*)   ? BUN 25 (*)   ? All other components within normal limits  ?URINE CULTURE  ?CBC  ? ? ? ?EKG: ? ? ?RADIOLOGY: ?My personal review and interpretation of imaging:   ? ?I have personally reviewed all radiology reports.   ?No  results found. ? ? ?PROCEDURES: ? ?Critical Care performed: No ? ? ? ? ? ?Procedures ? ? ? ?IMPRESSION / MDM / ASSESSMENT AND PLAN / ED COURSE  ?I reviewed the triage vital signs and the nursing notes. ? ? ? ?Patient here with urinary retention with indwelling Foley catheter in place.  History of hypotonic bladder. ? ? ? ? ?DIFFERENTIAL DIAGNOSIS (includes but not limited to):  Urinary retention from Foley catheter obstruction, UTI, dislodged Foley ? ? ?PLAN: Attempted to irrigate catheter without any success.  Catheter exchange and is now draining appropriately and he feels much better.  Will obtain CBC, BMP, urinalysis, urine culture. ? ? ?MEDICATIONS GIVEN IN ED: ?Medications  ?cephALEXin (KEFLEX) capsule 500 mg (500 mg Oral Given 04/27/22 0254)  ? ? ? ?ED COURSE: Labs reassuring.  No leukocytosis.  No acute renal failure.  Urine appears grossly infected with large leukocyte esterase and greater than 50,000 red blood cells and white blood cells but no bacteria.  We will send urine culture.  Recent urine culture grew Proteus that was sensitive to cephalosporins.  Will discharge with Keflex.  He has an appointment to see [facility] urology on May 9. ? ?At this time, I do not feel there is any life-threatening condition present. I reviewed all nursing notes, vitals, pertinent previous records.  All lab and urine results, EKGs, imaging ordered have been independently reviewed and interpreted by myself.  I reviewed all available radiology reports from any imaging ordered this visit.  Based on my assessment, I feel the patient is safe to be discharged home without further emergent workup and can continue workup as an outpatient as needed. Discussed all findings, treatment plan as well as usual and customary return precautions with patient and wife.  They verbalize understanding and are comfortable with this plan.  Outpatient follow-up has been provided as needed.  All questions have been answered. ? ? ? ?CONSULTS: No emergent  urologic consultation needed at this time given patient feeling better and Foley catheter is draining well after exchange. ? ? ?OUTSIDE RECORDS REVIEWED: Reviewed patient's last urology office visit on 4/25/202

## 2022-04-27 NOTE — ED Notes (Signed)
Pt folley removed by nurse in triage and new one put in  ?

## 2022-04-29 LAB — URINE CULTURE: Culture: >=100000 — AB

## 2022-05-20 ENCOUNTER — Ambulatory Visit: Payer: Medicare PPO | Admitting: Physician Assistant

## 2022-06-14 IMAGING — US US RENAL
1 series · 14 of 25 positions shown · non-contrast
Comparison: CT abdomen/pelvis 1 day prior

CLINICAL DATA: YLLA

EXAM:
RENAL / URINARY TRACT ULTRASOUND COMPLETE

[Series 1: us renal · 50 acquisitions, 14 frames shown]
[im 1/50]
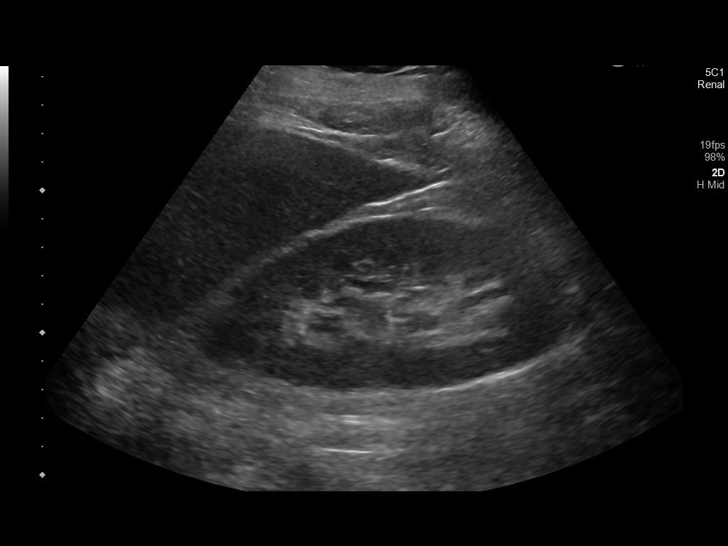
[im 5/50]
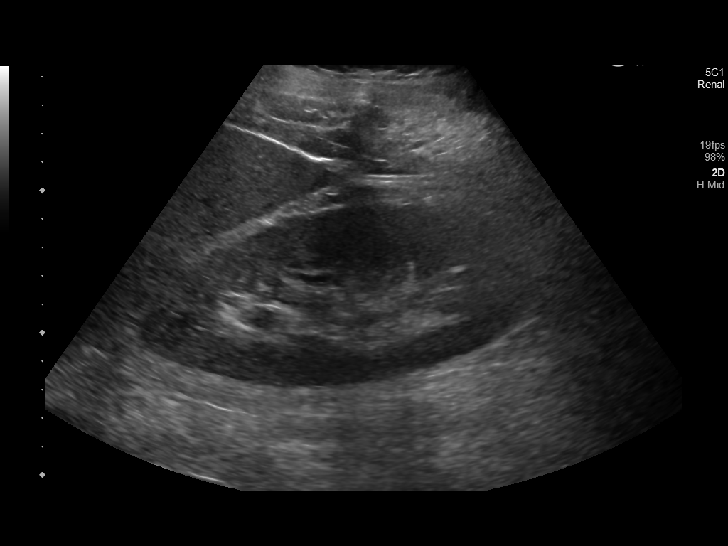
[im 9/50]
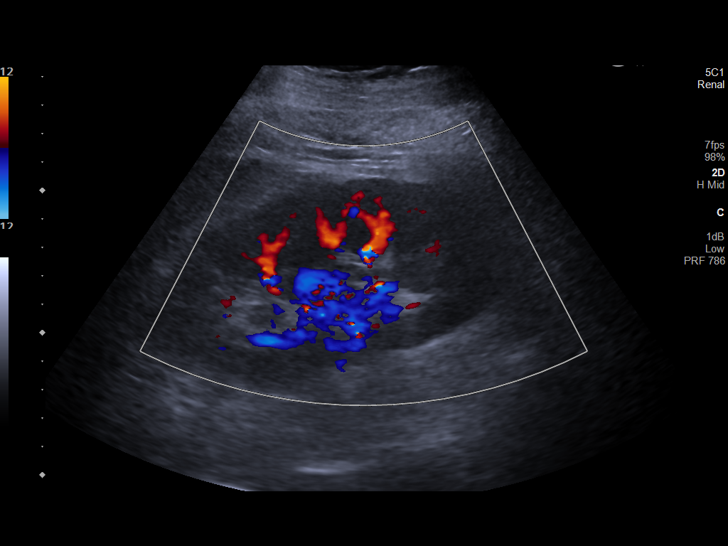
[im 13/50]
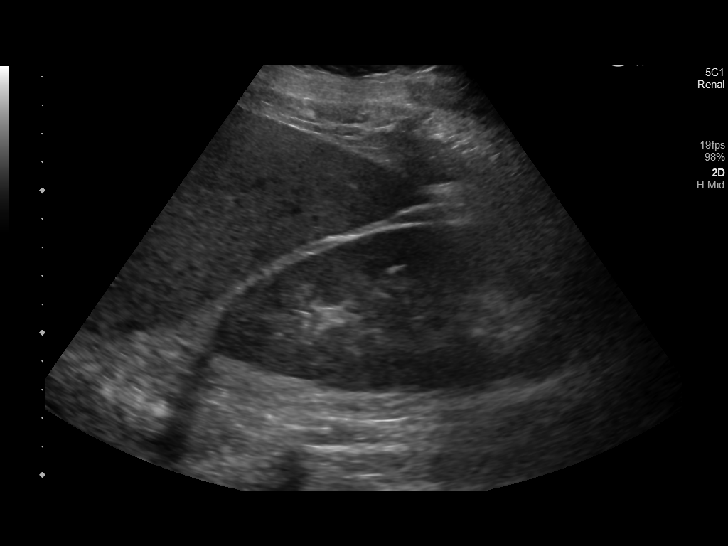
[im 17/50]
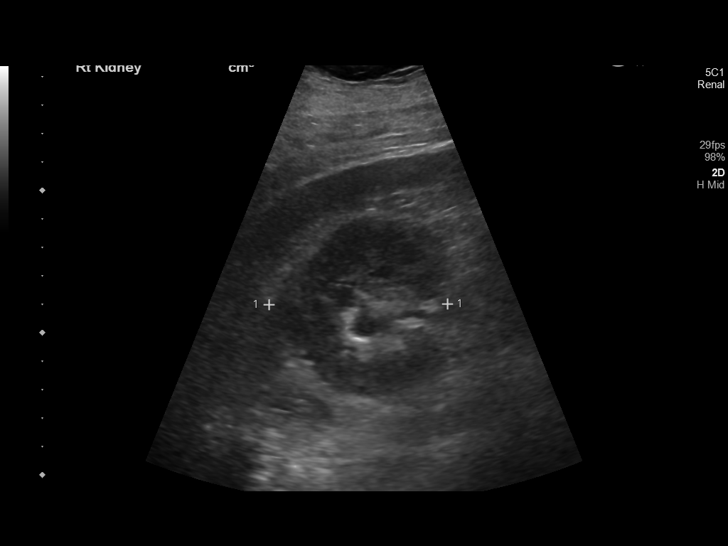
[im 19/50]
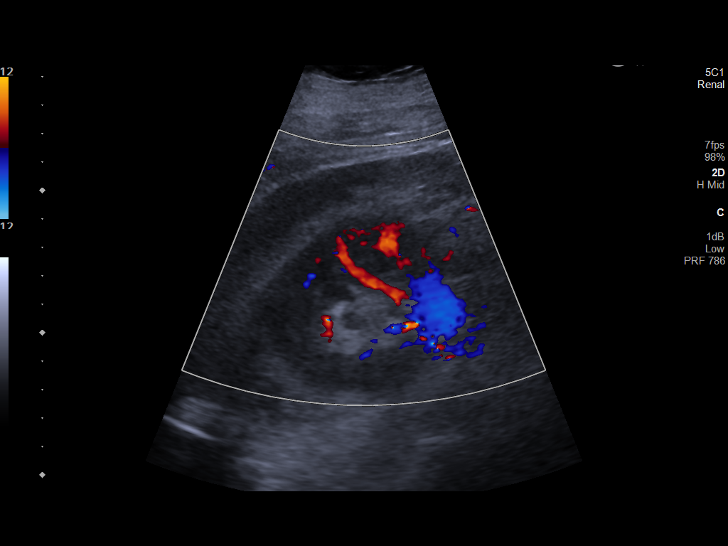
[im 23/50]
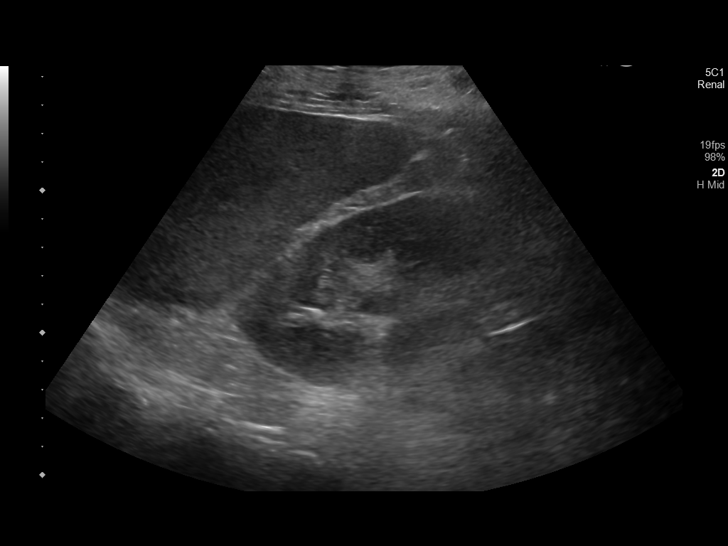
[im 27/50]
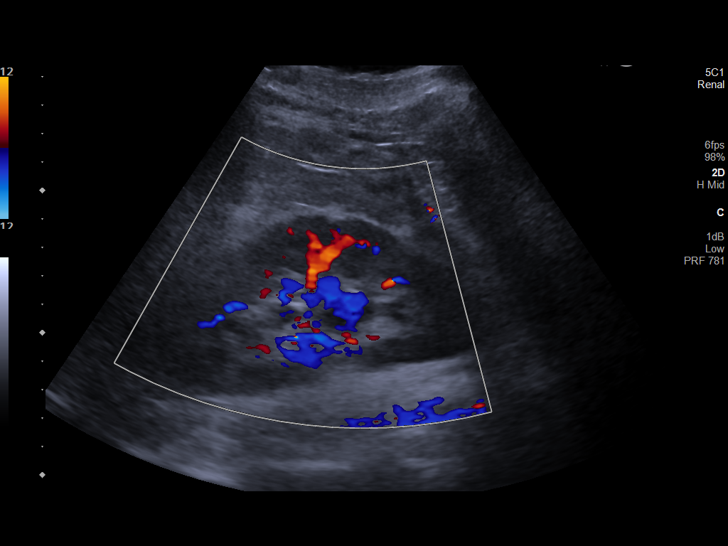
[im 31/50]
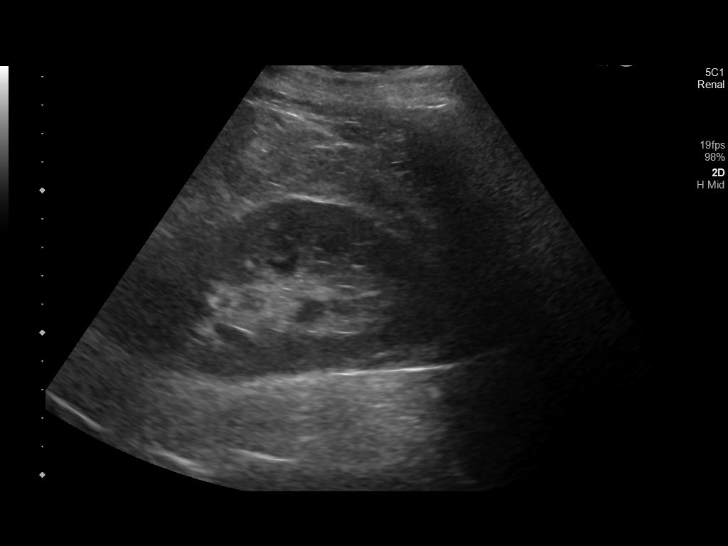
[im 33/50]
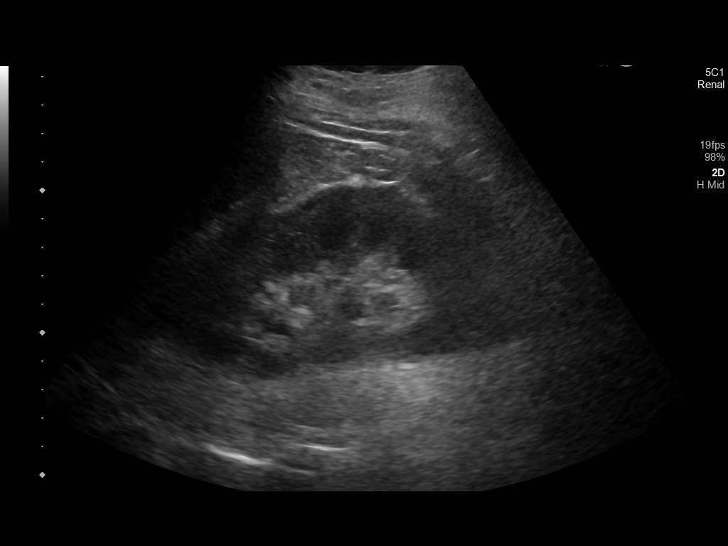
[im 37/50]
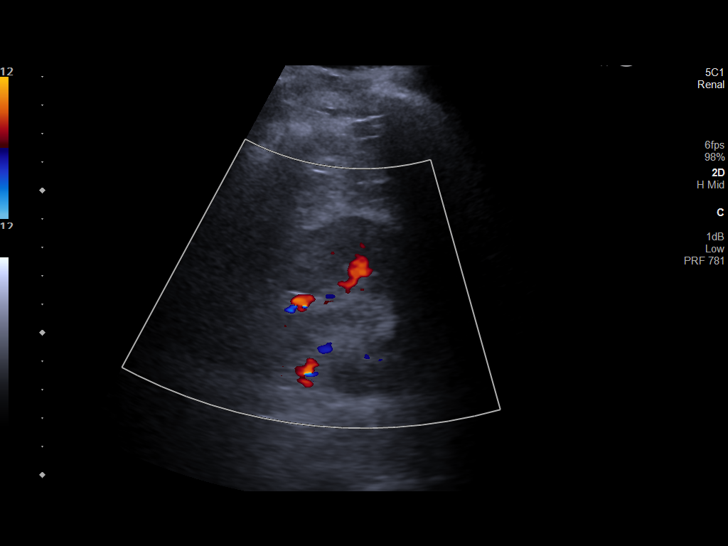
[im 41/50]
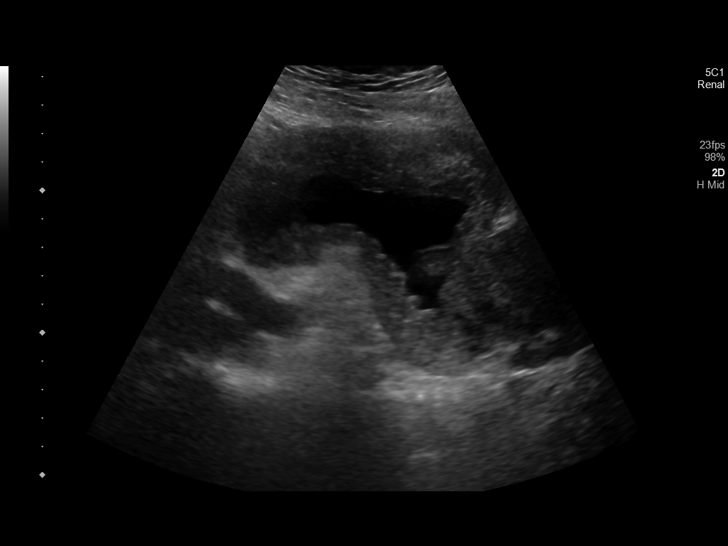
[im 45/50]
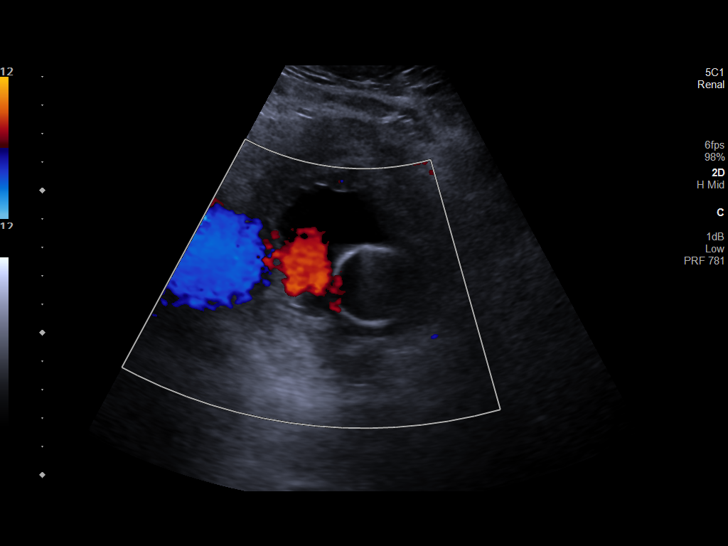
[im 50/50]
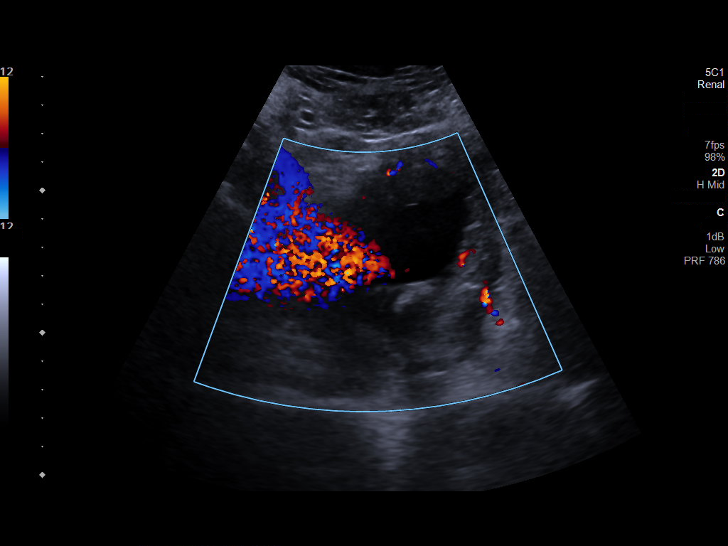

[14 of 25 positions shown; findings below may reference images not displayed]

FINDINGS: Right Kidney:

Renal measurements: 14.0 cm x 6.0 cm x 6.3 cm = volume: 274 mL.
Parenchymal echogenicity is within normal limits. There is a 1.2 cm
anechoic cyst in the interpolar region. There is mild fullness of
the collecting system without frank hydronephrosis, likely improved
compared to the prior CT.

Left Kidney:

Renal measurements: 12.8 cm x 5.8 cm x 6.5 cm = volume: 154 mL.
Parenchymal echogenicity is normal. There is mild hydronephrosis,
also likely improved compared to the prior CT.

Bladder:

There is marked thickening of the bladder wall with a Foley catheter
in place.

Other:

None.
IMPRESSION: 1. Interval decompression of the bladder with improvement in the
previously seen bilateral hydronephrosis.
2. Markedly thickened bladder wall likely reflecting decompression
and sequela of chronic outlet obstruction.

## 2022-07-12 ENCOUNTER — Telehealth (INDEPENDENT_AMBULATORY_CARE_PROVIDER_SITE_OTHER): Payer: Medicare PPO | Admitting: Internal Medicine

## 2022-07-12 ENCOUNTER — Ambulatory Visit: Payer: Self-pay | Admitting: *Deleted

## 2022-07-12 ENCOUNTER — Encounter: Payer: Self-pay | Admitting: Internal Medicine

## 2022-07-12 DIAGNOSIS — R0981 Nasal congestion: Secondary | ICD-10-CM | POA: Diagnosis not present

## 2022-07-12 DIAGNOSIS — U071 COVID-19: Secondary | ICD-10-CM

## 2022-07-12 DIAGNOSIS — R051 Acute cough: Secondary | ICD-10-CM

## 2022-07-12 MED ORDER — FLUTICASONE PROPIONATE 50 MCG/ACT NA SUSP: 2.0000 | Freq: Every day | NASAL | 6 refills | Status: DC

## 2022-07-12 MED ORDER — NIRMATRELVIR/RITONAVIR (PAXLOVID)TABLET: 3.0000 | ORAL_TABLET | Freq: Two times a day (BID) | ORAL | 0 refills | Status: AC

## 2022-07-12 MED ORDER — BENZONATATE 100 MG PO CAPS: 100.0000 mg | ORAL_CAPSULE | Freq: Two times a day (BID) | ORAL | 0 refills | Status: DC | PRN

## 2022-07-12 NOTE — Telephone Encounter (Signed)
Message from Luciana Axe sent at 07/12/2022  9:13 AM EDT  Summary: COVID advice   Pt is calling to report that he just tested positive for COVID- Pt report dizziness, temp of 99.1, pressure in head           Call History   Type Contact Phone/Fax User  07/12/2022 09:11 AM EDT Phone (Incoming) Alan Holt, Alan Holt (Self) [phone] Jerilynn Mages) Blase Mess A   Reason for Disposition  [1] HIGH RISK patient (e.g., weak immune system, age > 56 years, obesity with BMI 30 or higher, pregnant, chronic lung disease or other chronic medical condition) AND [2] COVID symptoms (e.g., cough, fever)  (Exceptions: Already seen by PCP and no new or worsening symptoms.)    Age and he does intermittent cathorizations for bladder issues  Protocols used: Coronavirus (COVID-19) Diagnosed or Suspected-A-AH

## 2022-07-12 NOTE — Telephone Encounter (Signed)
Pt wanted to be seen today but due to no openings he is in for tomorrow for a virtual. Pt is covid positive and is concerned about a fever that he currently has

## 2022-07-12 NOTE — Progress Notes (Signed)
Virtual Visit via Video Note  I connected with Alan Holt on 07/12/22 at  2:00 PM EDT by a video enabled telemedicine application and verified that I am speaking with the correct person using two identifiers.  Location: Patient: Home Provider: [facility]   I discussed the limitations of evaluation and management by telemedicine and the availability of in person appointments. The patient expressed understanding and agreed to proceed.  History of Present Illness: Alan Holt is a 71 year old male who tested positive for COVID this morning. Symptoms started yesterday (07/11/22). Wife has symptoms and is positive as well.   URI Compliant:  -Worst symptom: cough, sore throat, sinus congestion, fatigue, fever -Fever: yes - highest 100.3 earlier today -Cough: yes, dry  -Shortness of breath: no -Wheezing: no -Chest pain: no -Chest tightness: no -Chest congestion: yes -Nasal congestion: yes -Runny nose: yes - clear  -Post nasal drip: yes -Sore throat: yes -Sinus pressure: yes -Headache: no -Face pain: no -Ear pain: no  -Ear pressure: no  -Vomiting: no -Fatigue: yes -Sick contacts: yes  -Context: fluctuating     Observations/Objective:  General: well appearing, no acute distress ENT: conjunctiva normal appearing bilaterally  Skin: no rashes, cyanosis or abnormal bruising noted Neuro: Answers all questions appropriately.  Assessment and Plan:  1. COVID-19/Acute cough/Nasal congestion: Tested positive today on 07/12/2022, symptoms started yesterday 07/11/2022.  GFR from May 2023 greater than 60, will treat with Paxlovid.  Otherwise treat symptomatically, Flonase and Tessalon Perles sent to his pharmacy.  Discussed using a pulse oximeter to monitor oxygen levels, if less than 88% and not coming up, will present to the ER for oxygen.  Patient has had all 5 COVID vaccines and boosters.  Recommend 10-day quarantine until 07/21/2022.  Otherwise continue to rest and stay well-hydrated and  follow-up if symptoms worsen or fail to improve.  - nirmatrelvir/ritonavir EUA (PAXLOVID) 20 x 150 MG & 10 x '100MG'$  TABS; Take 3 tablets by mouth 2 (two) times daily for 5 days. (Take nirmatrelvir 150 mg two tablets twice daily for 5 days and ritonavir 100 mg one tablet twice daily for 5 days) Patient GFR is >60.  Dispense: 30 tablet; Refill: 0 - fluticasone (FLONASE) 50 MCG/ACT nasal spray; Place 2 sprays into both nostrils daily.  Dispense: 16 g; Refill: 6 - benzonatate (TESSALON) 100 MG capsule; Take 1 capsule (100 mg total) by mouth 2 (two) times daily as needed for cough.  Dispense: 20 capsule; Refill: 0  Follow Up Instructions: As needed if symptoms worsen or fail to improve.    I discussed the assessment and treatment plan with the patient. The patient was provided an opportunity to ask questions and all were answered. The patient agreed with the plan and demonstrated an understanding of the instructions.   The patient was advised to call back or seek an in-person evaluation if the symptoms worsen or if the condition fails to improve as anticipated.  I provided 10 minutes of non-face-to-face time during this encounter.   Teodora Medici, DO

## 2022-07-12 NOTE — Telephone Encounter (Signed)
  Chief Complaint: Asking for Dr. Rosana Berger to call in an antiviral for Covid.   Symptoms: Symptoms started Fri or Sat.  Last night the symptoms became worse:  irritated throat, left sinus congestion, fever, dizziness, pressure in his head Frequency: Started Sat. But sx worse last night and this morning Pertinent Negatives: Patient denies change in taste/smell Disposition: '[]'$ ED /'[]'$ Urgent Care (no appt availability in office) / '[]'$ Appointment(In office/virtual)/ '[]'$  [facility] [facility]/ '[]'$ Home Care/ '[]'$ Refused Recommended Disposition /'[]'$ [place] [facility]/ '[x]'$  Follow-up with PCP Additional Notes: Pt really wanted to know if Dr. Rosana Berger would just call in an antiviral for Covid.  I offered him a virtual visit with Dr. Rosana Berger but next appt. Wasn't until Aug. 4th.   He didn't want me to get him an appt with another provider sooner.   "Just ask if she would be willing to call in an antiviral".   "My wife has Covid too".   "My test is positive".   "I would like to get started on the medication as soon as possible".  I let him know I would send a message to Dr. Rosana Berger asking if she would call in an antiviral but they really prefer to talk with you either in the office or virtually.

## 2022-07-12 NOTE — Telephone Encounter (Signed)
We will go ahead and squeeze in,  Put him in for today at 2:00 for virtual

## 2022-07-13 ENCOUNTER — Telehealth: Payer: Medicare PPO | Admitting: Internal Medicine

## 2022-09-02 DIAGNOSIS — E890 Postprocedural hypothyroidism: Secondary | ICD-10-CM | POA: Diagnosis not present

## 2022-09-06 DIAGNOSIS — R339 Retention of urine, unspecified: Secondary | ICD-10-CM | POA: Diagnosis not present

## 2022-09-13 DIAGNOSIS — H9319 Tinnitus, unspecified ear: Secondary | ICD-10-CM | POA: Diagnosis not present

## 2022-09-13 DIAGNOSIS — G4733 Obstructive sleep apnea (adult) (pediatric): Secondary | ICD-10-CM | POA: Diagnosis not present

## 2022-09-13 DIAGNOSIS — E890 Postprocedural hypothyroidism: Secondary | ICD-10-CM | POA: Diagnosis not present

## 2022-09-15 DIAGNOSIS — R339 Retention of urine, unspecified: Secondary | ICD-10-CM | POA: Diagnosis not present

## 2022-09-21 DIAGNOSIS — H903 Sensorineural hearing loss, bilateral: Secondary | ICD-10-CM | POA: Diagnosis not present

## 2022-09-30 DIAGNOSIS — H2513 Age-related nuclear cataract, bilateral: Secondary | ICD-10-CM | POA: Diagnosis not present

## 2022-09-30 LAB — HM DIABETES EYE EXAM

## 2022-10-07 ENCOUNTER — Ambulatory Visit: Payer: Medicare PPO | Admitting: Dermatology

## 2022-10-07 DIAGNOSIS — D1801 Hemangioma of skin and subcutaneous tissue: Secondary | ICD-10-CM | POA: Diagnosis not present

## 2022-10-07 DIAGNOSIS — L821 Other seborrheic keratosis: Secondary | ICD-10-CM | POA: Diagnosis not present

## 2022-10-07 DIAGNOSIS — L814 Other melanin hyperpigmentation: Secondary | ICD-10-CM | POA: Diagnosis not present

## 2022-10-07 DIAGNOSIS — L738 Other specified follicular disorders: Secondary | ICD-10-CM

## 2022-10-07 DIAGNOSIS — D229 Melanocytic nevi, unspecified: Secondary | ICD-10-CM

## 2022-10-07 DIAGNOSIS — D239 Other benign neoplasm of skin, unspecified: Secondary | ICD-10-CM

## 2022-10-07 DIAGNOSIS — Z1283 Encounter for screening for malignant neoplasm of skin: Secondary | ICD-10-CM

## 2022-10-07 DIAGNOSIS — L578 Other skin changes due to chronic exposure to nonionizing radiation: Secondary | ICD-10-CM

## 2022-10-07 DIAGNOSIS — Z86018 Personal history of other benign neoplasm: Secondary | ICD-10-CM

## 2022-10-07 NOTE — Patient Instructions (Signed)
Due to recent changes in healthcare laws, you may see results of your pathology and/or laboratory studies on MyChart before the doctors have had a chance to review them. We understand that in some cases there may be results that are confusing or concerning to you. Please understand that not all results are received at the same time and often the doctors may need to interpret multiple results in order to provide you with the best plan of care or course of treatment. Therefore, we ask that you please give us 2 business days to thoroughly review all your results before contacting the office for clarification. Should we see a critical lab result, you will be contacted sooner.   If You Need Anything After Your Visit  If you have any questions or concerns for your doctor, please call our main line at [phone] and press option 4 to reach your doctor's medical assistant. If no one answers, please leave a voicemail as directed and we will return your call as soon as possible. Messages left after 4 pm will be answered the following business day.   You may also send us a message via MyChart. We typically respond to MyChart messages within 1-2 business days.  For prescription refills, please ask your pharmacy to contact our office. Our fax number is [phone].  If you have an urgent issue when the clinic is closed that cannot wait until the next business day, you can page your doctor at the number below.    Please note that while we do our best to be available for urgent issues outside of office hours, we are not available 24/7.   If you have an urgent issue and are unable to reach us, you may choose to seek medical care at your doctor's office, retail clinic, urgent care center, or emergency room.  If you have a medical emergency, please immediately call 911 or go to the emergency department.  Pager Numbers  - Dr. Kowalski: [phone]  - Dr. Moye: [phone]  - Dr. Stewart:  [phone]  In the event of inclement weather, please call our main line at [phone] for an update on the status of any delays or closures.  Dermatology Medication Tips: Please keep the boxes that topical medications come in in order to help keep track of the instructions about where and how to use these. Pharmacies typically print the medication instructions only on the boxes and not directly on the medication tubes.   If your medication is too expensive, please contact our office at [phone] option 4 or send us a message through MyChart.   We are unable to tell what your co-pay for medications will be in advance as this is different depending on your insurance coverage. However, we may be able to find a substitute medication at lower cost or fill out paperwork to get insurance to cover a needed medication.   If a prior authorization is required to get your medication covered by your insurance company, please allow us 1-2 business days to complete this process.  Drug prices often vary depending on where the prescription is filled and some pharmacies may offer cheaper prices.  The website www.goodrx.com contains coupons for medications through different pharmacies. The prices here do not account for what the cost may be with help from insurance (it may be cheaper with your insurance), but the website can give you the price if you did not use any insurance.  - You can print the associated coupon and take it with   your prescription to the pharmacy.  - You may also stop by our office during regular business hours and pick up a GoodRx coupon card.  - If you need your prescription sent electronically to a different pharmacy, notify our office through [facility] MyChart or by phone at [phone] option 4.     Si Usted Necesita Algo Despus de Su Visita  Tambin puede enviarnos un mensaje a travs de MyChart. Por lo general respondemos a los mensajes de MyChart en el transcurso de 1 a 2  das hbiles.  Para renovar recetas, por favor pida a su farmacia que se ponga en contacto con nuestra oficina. Nuestro nmero de fax es el [phone].  Si tiene un asunto urgente cuando la clnica est cerrada y que no puede esperar hasta el siguiente da hbil, puede llamar/localizar a su doctor(a) al nmero que aparece a continuacin.   Por favor, tenga en cuenta que aunque hacemos todo lo posible para estar disponibles para asuntos urgentes fuera del horario de oficina, no estamos disponibles las 24 horas del da, los 7 das de la semana.   Si tiene un problema urgente y no puede comunicarse con nosotros, puede optar por buscar atencin mdica  en el consultorio de su doctor(a), en una clnica privada, en un centro de atencin urgente o en una sala de emergencias.  Si tiene una emergencia mdica, por favor llame inmediatamente al 911 o vaya a la sala de emergencias.  Nmeros de bper  - Dr. Kowalski: [phone]  - Dra. Moye: [phone]  - Dra. Stewart: [phone]  En caso de inclemencias del tiempo, por favor llame a nuestra lnea principal al [phone] para una actualizacin sobre el estado de cualquier retraso o cierre.  Consejos para la medicacin en dermatologa: Por favor, guarde las cajas en las que vienen los medicamentos de uso tpico para ayudarle a seguir las instrucciones sobre dnde y cmo usarlos. Las farmacias generalmente imprimen las instrucciones del medicamento slo en las cajas y no directamente en los tubos del medicamento.   Si su medicamento es muy caro, por favor, pngase en contacto con nuestra oficina llamando al [phone] y presione la opcin 4 o envenos un mensaje a travs de MyChart.   No podemos decirle cul ser su copago por los medicamentos por adelantado ya que esto es diferente dependiendo de la cobertura de su seguro. Sin embargo, es posible que podamos encontrar un medicamento sustituto a menor costo o llenar un formulario para que el  seguro cubra el medicamento que se considera necesario.   Si se requiere una autorizacin previa para que su compaa de seguros cubra su medicamento, por favor permtanos de 1 a 2 das hbiles para completar este proceso.  Los precios de los medicamentos varan con frecuencia dependiendo del lugar de dnde se surte la receta y alguna farmacias pueden ofrecer precios ms baratos.  El sitio web www.goodrx.com tiene cupones para medicamentos de diferentes farmacias. Los precios aqu no tienen en cuenta lo que podra costar con la ayuda del seguro (puede ser ms barato con su seguro), pero el sitio web puede darle el precio si no utiliz ningn seguro.  - Puede imprimir el cupn correspondiente y llevarlo con su receta a la farmacia.  - Tambin puede pasar por nuestra oficina durante el horario de atencin regular y recoger una tarjeta de cupones de GoodRx.  - Si necesita que su receta se enve electrnicamente a una farmacia diferente, informe a nuestra oficina a travs de MyChart de    o por telfono llamando al [phone] y presione la opcin 4.  

## 2022-10-07 NOTE — Progress Notes (Signed)
   Follow-Up Visit   Subjective  Alan Holt is a 71 y.o. male who presents for the following: Annual Exam (Hx dysplastic nevi, AK's ). The patient presents for Total-Body Skin Exam (TBSE) for skin cancer screening and mole check.  The patient has spots, moles and lesions to be evaluated, some may be new or changing.  The following portions of the chart were reviewed this encounter and updated as appropriate:   Tobacco  Allergies  Meds  Problems  Med Hx  Surg Hx  Fam Hx     Review of Systems:  No other skin or systemic complaints except as noted in HPI or Assessment and Plan.  Objective  Well appearing patient in no apparent distress; mood and affect are within normal limits.  A full examination was performed including scalp, head, eyes, ears, nose, lips, neck, chest, axillae, abdomen, back, buttocks, bilateral upper extremities, bilateral lower extremities, hands, feet, fingers, toes, fingernails, and toenails. All findings within normal limits unless otherwise noted below.   Assessment & Plan   Lentigines - Scattered tan macules - Due to sun exposure - Benign-appearing, observe - Recommend daily broad spectrum sunscreen SPF 30+ to sun-exposed areas, reapply every 2 hours as needed. - Call for any changes  Seborrheic Keratoses - Stuck-on, waxy, tan-brown papules and/or plaques  - Benign-appearing - Discussed benign etiology and prognosis. - Observe - Call for any changes  Melanocytic Nevi - Tan-brown and/or pink-flesh-colored symmetric macules and papules - Benign appearing on exam today - Observation - Call clinic for new or changing moles - Recommend daily use of broad spectrum spf 30+ sunscreen to sun-exposed areas.   Hemangiomas - Red papules - Discussed benign nature - Observe - Call for any changes  Actinic Damage - Chronic condition, secondary to cumulative UV/sun exposure - diffuse scaly erythematous macules with underlying dyspigmentation -  Recommend daily broad spectrum sunscreen SPF 30+ to sun-exposed areas, reapply every 2 hours as needed.  - Staying in the shade or wearing long sleeves, sun glasses (UVA+UVB protection) and wide brim hats (4-inch brim around the entire circumference of the hat) are also recommended for sun protection.  - Call for new or changing lesions.  Sebaceous Hyperplasia - Small yellow papules with a central dell - Benign - Observe  History of Dysplastic Nevi - No evidence of recurrence today - Recommend regular full body skin exams - Recommend daily broad spectrum sunscreen SPF 30+ to sun-exposed areas, reapply every 2 hours as needed.  - Call if any new or changing lesions are noted between office visits  Skin cancer screening performed today.  Return in about 1 year (around 10/08/2023) for TBSE.  Luther Redo, CMA, am acting as scribe for Sarina Ser, MD . Documentation: I have reviewed the above documentation for accuracy and completeness, and I agree with the above.  Sarina Ser, MD

## 2022-10-12 DIAGNOSIS — H903 Sensorineural hearing loss, bilateral: Secondary | ICD-10-CM | POA: Diagnosis not present

## 2022-10-19 ENCOUNTER — Encounter: Payer: Self-pay | Admitting: Dermatology

## 2022-10-20 ENCOUNTER — Other Ambulatory Visit: Payer: Self-pay | Admitting: Internal Medicine

## 2022-10-20 DIAGNOSIS — E782 Mixed hyperlipidemia: Secondary | ICD-10-CM

## 2022-10-20 DIAGNOSIS — E119 Type 2 diabetes mellitus without complications: Secondary | ICD-10-CM

## 2022-10-21 NOTE — Telephone Encounter (Signed)
Requested Prescriptions  Pending Prescriptions Disp Refills  . ezetimibe (ZETIA) 10 MG tablet [Pharmacy Med Name: EZETIMIBE 10 MG TAB] 90 tablet 0    Sig: TAKE 1 TABLET BY MOUTH DAILY     Cardiovascular:  Antilipid - Sterol Transport Inhibitors Failed - 10/20/2022  8:14 AM      Failed - Lipid Panel in normal range within the last 12 months    Cholesterol, Total  Date Value Ref Range Status  01/12/2016 151 100 - 199 mg/dL Final   Cholesterol  Date Value Ref Range Status  04/23/2022 150 <200 mg/dL Final   LDL Cholesterol (Calc)  Date Value Ref Range Status  04/23/2022 86 mg/dL (calc) Final    Comment:    Reference range: <100 . Desirable range <100 mg/dL for primary prevention;   <70 mg/dL for patients with CHD or diabetic patients  with > or = 2 CHD risk factors. Marland Kitchen LDL-C is now calculated using the Martin-Hopkins  calculation, which is a validated novel method providing  better accuracy than the Friedewald equation in the  estimation of LDL-C.  Cresenciano Genre et al. Annamaria Helling. 2992;426(83): 2061-2068  (http://education.[employer].com/faq/FAQ164)    HDL  Date Value Ref Range Status  04/23/2022 39 (L) > OR = 40 mg/dL Final  01/12/2016 37 (L) >39 mg/dL Final   Triglycerides  Date Value Ref Range Status  04/23/2022 146 <150 mg/dL Final         Passed - AST in normal range and within 360 days    AST  Date Value Ref Range Status  04/23/2022 19 10 - 35 U/L Final         Passed - ALT in normal range and within 360 days    ALT  Date Value Ref Range Status  04/23/2022 25 9 - 46 U/L Final         Passed - Patient is not pregnant      Passed - Valid encounter within last 12 months    Recent Outpatient Visits          3 months ago COVID-19   [facility] Teodora Medici, DO   6 months ago Type 2 diabetes mellitus without complication, without long-term current use of insulin [facility])   [place], DO   7 months  ago AKI (acute kidney injury) [facility])   [place], DO   7 months ago Constipation, unspecified constipation type   [place], PA-C   12 months ago Dark urine   [facility] Teodora Medici, DO      Future Appointments            In 4 days Teodora Medici, [facility], [place]   In 3 months  [place]

## 2022-10-24 NOTE — Progress Notes (Unsigned)
Established Patient Office Visit  Subjective   Patient ID: Alan Holt, male    DOB: 12-21-1951  Age: 71 y.o. MRN: [id number]  No chief complaint on file.   HPI Alan Holt is a 71 year old male here for follow-up on chronic medical conditions.  OSA: -Compliant with CPAP  HLD: -Medications: Zetia 10 mg started at LOV, history of rhabdomyolysis on statin last fall -Last lipid panel: Lipid Panel     Component Value Date/Time   CHOL 150 04/23/2022 0849   CHOL 151 01/12/2016 0853   TRIG 146 04/23/2022 0849   HDL 39 (L) 04/23/2022 0849   HDL 37 (L) 01/12/2016 0853   CHOLHDL 3.8 04/23/2022 0849   VLDL 27 05/03/2017 0906   LDLCALC 86 04/23/2022 0849   LABVLDL 36 01/12/2016 0853   The 10-year ASCVD risk score (Arnett DK, et al., 2019) is: 38.6%   Values used to calculate the score:     Age: 35 years     Sex: Male     Is Non-Hispanic African American: No     Diabetic: Yes     Tobacco smoker: No     Systolic Blood Pressure: 353 mmHg     Is BP treated: No     HDL Cholesterol: 39 mg/dL     Total Cholesterol: 150 mg/dL  Diabetes, Type 2: -Last A1c 3/23 6.4% -Medications: None, diet controlled -Checking BG at home: 125 post-prandial yesterday  -Eye exam: 9/22 [facility]  -Foot exam: UTD 4/23  -Microalbumin: Due -Statin: No, history of intolerance  -PNA vaccine: Due for Prevnar 20 -Denies symptoms of hypoglycemia, polyuria, polydipsia, numbness extremities, foot ulcers/trauma.   Hypothyroidism: -Medications: Levothyroxine 150 mcg -S/p thyroidectomy in 2015, enlarged with calcifications  -Patient is compliant with the above medication (s) at the above dose and reports no medication side effects.  -Denies weight changes, cold./heat intolerance, skin changes, anxiety/palpitations  -Last TSH: Uncertain, follows with Endocrinology, seeing them in September.  Unable to view note.  BPH/Bladder Outlet obstruction/hypotonic bladder: -Following with urology, last note  reviewed from 04/20/2022; has indwelling urethral Foley placed -Planning on returning to urology in about 4 weeks for catheter exchange -Currently on Flomax and finasteride 5 mg  Health maintenance: -Blood work UTD   Review of Systems  Constitutional:  Negative for chills and fever.  Respiratory:  Negative for shortness of breath.   Cardiovascular:  Negative for chest pain.  Genitourinary:  Negative for dysuria and hematuria.  Musculoskeletal:  Negative for myalgias.  Neurological:  Negative for dizziness and tingling.      Objective:     There were no vitals taken for this visit. BP Readings from Last 3 Encounters:  04/27/22 130/82  04/23/22 118/78  04/02/22 (!) 158/76   Wt Readings from Last 3 Encounters:  04/26/22 219 lb (99.3 kg)  03/15/22 215 lb (97.5 kg)  03/08/22 217 lb 8 oz (98.7 kg)      Physical Exam Constitutional:      Appearance: Normal appearance.  HENT:     Head: Normocephalic and atraumatic.  Eyes:     Conjunctiva/sclera: Conjunctivae normal.  Cardiovascular:     Rate and Rhythm: Normal rate and regular rhythm.     Pulses:          Dorsalis pedis pulses are 2+ on the right side and 2+ on the left side.  Pulmonary:     Effort: Pulmonary effort is normal.     Breath sounds: Normal breath sounds.  Genitourinary:  Comments: Foley present Musculoskeletal:     Right lower leg: No edema.     Left lower leg: No edema.     Right foot: Normal range of motion. No deformity, bunion, Charcot foot, foot drop or prominent metatarsal heads.     Left foot: Normal range of motion. No deformity, bunion, Charcot foot, foot drop or prominent metatarsal heads.  Feet:     Right foot:     Protective Sensation: 7 sites tested.  7 sites sensed.     Skin integrity: Skin integrity normal.     Toenail Condition: Right toenails are normal.     Left foot:     Protective Sensation: 7 sites tested.  7 sites sensed.     Skin integrity: Skin integrity normal.     Toenail  Condition: Left toenails are normal.  Skin:    General: Skin is warm and dry.  Neurological:     General: No focal deficit present.     Mental Status: He is alert. Mental status is at baseline.  Psychiatric:        Mood and Affect: Mood normal.        Behavior: Behavior normal.      No results found for any visits on 10/25/22.  Last CBC Lab Results  Component Value Date   WBC 9.5 04/26/2022   HGB 14.0 04/26/2022   HCT 41.6 04/26/2022   MCV 88.7 04/26/2022   MCH 29.9 04/26/2022   RDW 13.0 04/26/2022   PLT 230 83/33/8329   Last metabolic panel Lab Results  Component Value Date   GLUCOSE 161 (H) 04/26/2022   NA 137 04/26/2022   K 3.4 (L) 04/26/2022   CL 101 04/26/2022   CO2 24 04/26/2022   BUN 25 (H) 04/26/2022   CREATININE 1.23 04/26/2022   GFRNONAA >60 04/26/2022   CALCIUM 9.3 04/26/2022   PROT 6.8 04/23/2022   ALBUMIN 4.2 03/02/2022   LABGLOB 2.2 01/12/2016   AGRATIO 2.0 01/12/2016   BILITOT 0.6 04/23/2022   ALKPHOS 64 03/02/2022   AST 19 04/23/2022   ALT 25 04/23/2022   ANIONGAP 12 04/26/2022   Last lipids Lab Results  Component Value Date   CHOL 150 04/23/2022   HDL 39 (L) 04/23/2022   LDLCALC 86 04/23/2022   TRIG 146 04/23/2022   CHOLHDL 3.8 04/23/2022   Last hemoglobin A1c Lab Results  Component Value Date   HGBA1C 6.4 (H) 03/01/2022   Last thyroid functions No results found for: "TSH", "T3TOTAL", "T4TOTAL", "THYROIDAB" Last vitamin D Lab Results  Component Value Date   VD25OH 51 02/01/2018   Last vitamin B12 and Folate No results found for: "VITAMINB12", "FOLATE"    The 10-year ASCVD risk score (Arnett DK, et al., 2019) is: 38.6%    Assessment & Plan:   Problem List Items Addressed This Visit   None  No follow-ups on file.    Teodora Medici, DO

## 2022-10-25 ENCOUNTER — Encounter: Payer: Self-pay | Admitting: Internal Medicine

## 2022-10-25 ENCOUNTER — Ambulatory Visit: Payer: Medicare PPO | Admitting: Internal Medicine

## 2022-10-25 VITALS — BP 136/80 | HR 73 | Temp 97.7°F | Resp 16 | Ht 74.0 in | Wt 225.1 lb

## 2022-10-25 DIAGNOSIS — E1165 Type 2 diabetes mellitus with hyperglycemia: Secondary | ICD-10-CM

## 2022-10-25 DIAGNOSIS — N312 Flaccid neuropathic bladder, not elsewhere classified: Secondary | ICD-10-CM | POA: Diagnosis not present

## 2022-10-25 DIAGNOSIS — E786 Lipoprotein deficiency: Secondary | ICD-10-CM

## 2022-10-25 DIAGNOSIS — E785 Hyperlipidemia, unspecified: Secondary | ICD-10-CM | POA: Diagnosis not present

## 2022-10-25 LAB — POCT GLYCOSYLATED HEMOGLOBIN (HGB A1C): Hemoglobin A1C: 6.5 % — AB (ref 4.0–5.6)

## 2022-10-25 NOTE — Patient Instructions (Signed)
It was great seeing you today!  Plan discussed at today's visit: -Prepare for fasting labs at follow up   Follow up in: 6 months   Take care and let us know if you have any questions or concerns prior to your next visit.  Dr. Rosana Berger

## 2022-10-26 LAB — MICROALBUMIN / CREATININE URINE RATIO
Creatinine, Urine: 124 mg/dL (ref 20–320)
Microalb Creat Ratio: 25 mcg/mg creat (ref <30)
Microalb, Ur: 3.1 mg/dL

## 2022-10-28 DIAGNOSIS — H903 Sensorineural hearing loss, bilateral: Secondary | ICD-10-CM | POA: Diagnosis not present

## 2022-11-04 DIAGNOSIS — N401 Enlarged prostate with lower urinary tract symptoms: Secondary | ICD-10-CM | POA: Diagnosis not present

## 2022-11-04 DIAGNOSIS — N200 Calculus of kidney: Secondary | ICD-10-CM | POA: Diagnosis not present

## 2022-11-04 DIAGNOSIS — R339 Retention of urine, unspecified: Secondary | ICD-10-CM | POA: Diagnosis not present

## 2022-11-30 ENCOUNTER — Other Ambulatory Visit: Payer: Self-pay

## 2022-11-30 ENCOUNTER — Emergency Department
Admission: EM | Admit: 2022-11-30 | Discharge: 2022-11-30 | Disposition: A | Discharge status: Home / Self Care | Payer: Medicare PPO | Attending: Emergency Medicine | Admitting: Emergency Medicine

## 2022-11-30 ENCOUNTER — Encounter: Payer: Self-pay | Admitting: Emergency Medicine

## 2022-11-30 DIAGNOSIS — N390 Urinary tract infection, site not specified: Secondary | ICD-10-CM | POA: Insufficient documentation

## 2022-11-30 DIAGNOSIS — R1032 Left lower quadrant pain: Secondary | ICD-10-CM | POA: Diagnosis present

## 2022-11-30 LAB — COMPREHENSIVE METABOLIC PANEL
ALT: 30 U/L (ref 0–44)
AST: 23 U/L (ref 15–41)
Albumin: 4.4 g/dL (ref 3.5–5.0)
Alkaline Phosphatase: 70 U/L (ref 38–126)
Anion gap: 8 (ref 5–15)
BUN: 19 mg/dL (ref 8–23)
CO2: 21 mmol/L — ABNORMAL LOW (ref 22–32)
Calcium: 9 mg/dL (ref 8.9–10.3)
Chloride: 108 mmol/L (ref 98–111)
Creatinine, Ser: 0.99 mg/dL (ref 0.61–1.24)
GFR, Estimated: >60 mL/min (ref >60)
Glucose, Bld: 214 mg/dL — ABNORMAL HIGH (ref 70–99)
Potassium: 3.5 mmol/L (ref 3.5–5.1)
Sodium: 137 mmol/L (ref 135–145)
Total Bilirubin: 1 mg/dL (ref 0.3–1.2)
Total Protein: 7.5 g/dL (ref 6.5–8.1)

## 2022-11-30 LAB — URINALYSIS, ROUTINE W REFLEX MICROSCOPIC
Bilirubin Urine: NEGATIVE
Glucose, UA: 50 mg/dL — AB
Hgb urine dipstick: NEGATIVE
Ketones, ur: NEGATIVE mg/dL
Nitrite: NEGATIVE
Protein, ur: NEGATIVE mg/dL
Specific Gravity, Urine: 1.019 (ref 1.005–1.030)
Squamous Epithelial / HPF: NONE SEEN (ref 0–5)
WBC, UA: >50 WBC/hpf — ABNORMAL HIGH (ref 0–5)
pH: 5 (ref 5.0–8.0)

## 2022-11-30 LAB — CBC WITH DIFFERENTIAL/PLATELET
Abs Immature Granulocytes: 0.04 10*3/uL (ref 0.00–0.07)
Basophils Absolute: 0 10*3/uL (ref 0.0–0.1)
Basophils Relative: 0 %
Eosinophils Absolute: 0 10*3/uL (ref 0.0–0.5)
Eosinophils Relative: 0 %
HCT: 47.6 % (ref 39.0–52.0)
Hemoglobin: 16.2 g/dL (ref 13.0–17.0)
Immature Granulocytes: 0 %
Lymphocytes Relative: 23 %
Lymphs Abs: 2.4 10*3/uL (ref 0.7–4.0)
MCH: 29.7 pg (ref 26.0–34.0)
MCHC: 34 g/dL (ref 30.0–36.0)
MCV: 87.3 fL (ref 80.0–100.0)
Monocytes Absolute: 0.8 10*3/uL (ref 0.1–1.0)
Monocytes Relative: 7 %
Neutro Abs: 7.2 10*3/uL (ref 1.7–7.7)
Neutrophils Relative %: 70 %
Platelets: 222 10*3/uL (ref 150–400)
RBC: 5.45 MIL/uL (ref 4.22–5.81)
RDW: 13 % (ref 11.5–15.5)
WBC: 10.4 10*3/uL (ref 4.0–10.5)
nRBC: 0 % (ref 0.0–0.2)

## 2022-11-30 MED ORDER — CIPROFLOXACIN HCL 500 MG PO TABS: 500.0000 mg | ORAL_TABLET | Freq: Two times a day (BID) | ORAL | 0 refills | Status: DC

## 2022-11-30 NOTE — ED Provider Notes (Signed)
[facility] Provider Note    Event Date/Time   First MD Initiated Contact with Patient 11/30/22 0720     (approximate)   History   Flank Pain   HPI  Alan Holt is a 71 y.o. 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  Started within the last day or so.  No fevers chills.  No nausea or vomiting.  No foul-smelling urine.  Normal bowel movements.  No history of kidney stones.  No hematuria.     Physical Exam   Triage Vital Signs: ED Triage Vitals  Enc Vitals Group     BP 11/30/22 0652 127/84     Pulse Rate 11/30/22 0652 79     Resp 11/30/22 0652 18     Temp 11/30/22 0652 98.5 F (36.9 C)     Temp Source 11/30/22 0652 Oral     SpO2 11/30/22 0652 99 %     Weight 11/30/22 0650 102.1 kg (225 lb)     Height 11/30/22 0650 1.88 m ('6\' 2"'$ )     Head Circumference --      Peak Flow --      Pain Score 11/30/22 0650 5     Pain Loc --      Pain Edu? --      Excl. in [place]? --     Most recent vital signs: Vitals:   11/30/22 0652  BP: 127/84  Pulse: 79  Resp: 18  Temp: 98.5 F (36.9 C)  SpO2: 99%     General: Awake, no distress.  CV:  Good peripheral perfusion.  Resp:  Normal effort.  Abd:  No distention.  Soft, nontender, no CVA tenderness. Other:     ED Results / Procedures / Treatments   Labs (all labs ordered are listed, but only abnormal results are displayed) Labs Reviewed  COMPREHENSIVE METABOLIC PANEL - Abnormal; Notable for the following components:      Result Value   CO2 21 (*)    Glucose, Bld 214 (*)    All other components within normal limits  URINALYSIS, ROUTINE W REFLEX MICROSCOPIC - Abnormal; Notable for the following components:   Color, Urine YELLOW (*)    APPearance HAZY (*)    Glucose, UA 50 (*)    Leukocytes,Ua MODERATE (*)    WBC, UA >50 (*)    Bacteria, UA RARE (*)     All other components within normal limits  URINE CULTURE  CBC WITH DIFFERENTIAL/PLATELET     EKG     RADIOLOGY     PROCEDURES:  Critical Care performed:   Procedures   MEDICATIONS ORDERED IN ED: Medications - No data to display   IMPRESSION / MDM / [place] / ED COURSE  I reviewed the triage vital signs and the nursing notes. Patient's presentation is most consistent with acute presentation with potential threat to life or bodily function.   Patient presents with left lower quadrant abdominal pain as detailed above.  Differential includes UTI, diverticulitis, ureterolithiasis.  Given history of self cath, will await urinalysis before deciding on imaging.  Lab work reviewed, normal white blood cell count, normal CMP, mildly elevated glucose.  Urinalysis is consistent with positive leukocytes positive bacteria consistent with UTI  Discussed with patient return precautions, will treat with outpatient antibiotics, no indication for admission at this time  FINAL CLINICAL IMPRESSION(S) / ED DIAGNOSES   Final diagnoses:  Lower urinary tract infectious disease     Rx / DC Orders   ED Discharge Orders          Ordered    ciprofloxacin (CIPRO) 500 MG tablet  2 times daily        11/30/22 0807             Note:  This document was prepared using Dragon voice recognition software and may include unintentional dictation errors.   Lavonia Drafts, MD 11/30/22 [phone]

## 2022-11-30 NOTE — Discharge Instructions (Signed)
Your urine test is suspicious for a urinary tract infection which I believe is causing your discomfort.  I would expect you to start feeling better after about 48 hours of antibiotics, if symptoms are worsening or if new symptoms such as fevers chills worsening flank pain please return immediately to the emergency department

## 2022-11-30 NOTE — ED Notes (Signed)
See triage note  Presents with some pain to left lateral abd   States pain started yesterday  and has become worse Pian increases with movement and deep breathing  Denies any fever or n/v

## 2022-11-30 NOTE — ED Triage Notes (Signed)
Left flank pain, onset yesterday afternoon. Pain somewhat sharp in nature, worsens with movement.  Pt utilizes self cath for urinary retention since May. Denies all urinary sx. Denies injury

## 2022-12-02 ENCOUNTER — Telehealth: Payer: Self-pay

## 2022-12-02 LAB — URINE CULTURE: Culture: 80000 — AB

## 2022-12-02 NOTE — Patient Outreach (Signed)
  Care Coordination [facility] Note Transition Care Management Follow-up Telephone Call Date of discharge and from where: 11/30/22 [facility] ED Dx: "lower UTI" Red on EMMI-ED Discharge Alert Reason: "Scheduled follow up appt? No" Red Alert Date: 12/01/22 How have you been since you were released from the hospital? Patient states he is "feeling a lot better." His urine much clearer. He voices that after the third dose of abx therapy he could tell an improvement in his overall sxs. RN CM educated patient on importance of completing abx therapy even though he is feeling better and he voiced understanding.  Any questions or concerns? No  Items Reviewed: Did the pt receive and understand the discharge instructions provided? Yes  Medications obtained and verified? Yes  Other? Yes  Any new allergies since your discharge? No  Dietary orders reviewed? Yes Do you have support at home? Yes   Home Care and Equipment/Supplies: Were home health services ordered? not applicable If so, what is the name of the agency? N/A  Has the agency set up a time to come to the patient's home? not applicable Were any new equipment or medical supplies ordered?  No What is the name of the medical supply agency? N/A Were you able to get the supplies/equipment? not applicable Do you have any questions related to the use of the equipment or supplies? No  Functional Questionnaire: (I = Independent and D = Dependent) ADLs: I  Bathing/Dressing- I  Meal Prep- I  Eating- I  Maintaining continence- I  Transferring/Ambulation- I  Managing Meds- I  Follow up appointments reviewed:  PCP Hospital f/u appt confirmed? Yes  Scheduled to see PCP next month. Patient does not wish to see MD any sooner since he is feeling better. He is aware to call if sxs worsen and/or unresolved. [facility] f/u appt confirmed?  N/A  . Are transportation arrangements needed? No  If their condition worsens, is the pt aware to call PCP or go  to the Emergency Dept.? Yes Was the patient provided with contact information for the PCP's office or ED? Yes Was to pt encouraged to call back with questions or concerns? Yes  SDOH assessments and interventions completed:   Yes SDOH Interventions Today    Flowsheet Row Most Recent Value  SDOH Interventions   Food Insecurity Interventions Intervention Not Indicated  Transportation Interventions Intervention Not Indicated       Care Coordination Interventions:  Education provided    Encounter Outcome:  Pt. Visit Completed    Enzo Montgomery, RN,BSN,CCM [facility] Telephonic Care Management Coordinator Direct Phone: [phone] Toll Free: [phone] Fax: [phone]

## 2022-12-06 ENCOUNTER — Telehealth: Payer: Self-pay | Admitting: Internal Medicine

## 2022-12-06 DIAGNOSIS — E119 Type 2 diabetes mellitus without complications: Secondary | ICD-10-CM

## 2022-12-06 DIAGNOSIS — E1165 Type 2 diabetes mellitus with hyperglycemia: Secondary | ICD-10-CM

## 2022-12-07 ENCOUNTER — Other Ambulatory Visit: Payer: Self-pay

## 2022-12-07 NOTE — Telephone Encounter (Signed)
Requested medication (s) are due for refill today: expired medication  Requested medication (s) are on the active medication list: yes   Last refill:  02/06/21 #100 3 refills  Future visit scheduled: yes in 4 months   Notes to clinic:   expired medication do you want to renew Rx?     Requested Prescriptions  Pending Prescriptions Disp Refills   Accu-Chek Softclix Lancets lancets [Pharmacy Med Name: ACCU-CHEK SOFTCLIX LANCETS] 100 each 3    Sig: USE AS DIRECTED TO MONITOR FASTING BLOODGLUCOSE ONCE DAILY AS NEEDED     Endocrinology: Diabetes - Testing Supplies Passed - 12/06/2022 12:41 PM      Passed - Valid encounter within last 12 months    Recent Outpatient Visits           1 month ago Type 2 diabetes mellitus with hyperglycemia, without long-term current use of insulin [facility])   [facility] Teodora Medici, DO   4 months ago COVID-19   [facility] Teodora Medici, DO   7 months ago Type 2 diabetes mellitus without complication, without long-term current use of insulin [facility])   [facility] Teodora Medici, DO   9 months ago AKI (acute kidney injury) [facility])   [facility] Rory Percy M, DO   9 months ago Constipation, unspecified constipation type   [place], PA-C       Future Appointments             In 2 months  [facility], [place]   In 4 months Teodora Medici, [facility], [place] test strip [place] Med Name: ACCU-CHEK GUIDE STRIP] 100 each     Sig: USE AS DIRECTED TO MONITOR FASTING BLOODGLUCOSE ONCE DAILY AS NEEDED     Endocrinology: Diabetes - Testing Supplies Passed - 12/06/2022 12:41 PM      Passed - Valid encounter within last 12 months    Recent Outpatient Visits           1 month ago Type 2 diabetes mellitus with hyperglycemia, without long-term current use of  insulin [facility])   [facility] Teodora Medici, DO   4 months ago COVID-19   [facility] Teodora Medici, DO   7 months ago Type 2 diabetes mellitus without complication, without long-term current use of insulin [facility])   [facility] Teodora Medici, DO   9 months ago AKI (acute kidney injury) [facility])   [place], DO   9 months ago Constipation, unspecified constipation type   [place], PA-C       Future Appointments             In 2 months  [facility], [place]   In 4 months Teodora Medici, [facility], [place]

## 2022-12-07 NOTE — Telephone Encounter (Signed)
Requested medication (s) are due for refill today: expired medication  Requested medication (s) are on the active medication list: yes   Last refill:   02/06/21 #10 3 refills  Future visit scheduled: yes in 4 months   Notes to clinic:  expired medication. Do you want to renew Rx?     Requested Prescriptions  Pending Prescriptions Disp Refills   Accu-Chek Softclix Lancets lancets [Pharmacy Med Name: ACCU-CHEK SOFTCLIX LANCETS] 100 each 3    Sig: USE AS DIRECTED TO MONITOR FASTING BLOODGLUCOSE ONCE DAILY AS NEEDED     Endocrinology: Diabetes - Testing Supplies Passed - 12/06/2022 12:41 PM      Passed - Valid encounter within last 12 months    Recent Outpatient Visits           1 month ago Type 2 diabetes mellitus with hyperglycemia, without long-term current use of insulin [facility])   [facility] Teodora Medici, DO   4 months ago COVID-19   [facility] Teodora Medici, DO   7 months ago Type 2 diabetes mellitus without complication, without long-term current use of insulin [facility])   [facility] Teodora Medic[facility], DO   9 months ago AKI (acute kidney injury) [facility])   [facility] Rory Percy M, DO   9 months ago Constipation, unspecified constipation type   [place], PA-C       Future Appointments             In 2 months  [facility], [place]   In 4 months Teodora Medici, [facility], [place] test strip [place] Med Name: ACCU-CHEK GUIDE STRIP] 100 each     Sig: USE AS DIRECTED TO MONITOR FASTING BLOODGLUCOSE ONCE DAILY AS NEEDED     Endocrinology: Diabetes - Testing Supplies Passed - 12/06/2022 12:41 PM      Passed - Valid encounter within last 12 months    Recent Outpatient Visits           1 month ago Type 2 diabetes mellitus with hyperglycemia, without long-term current use of  insulin [facility])   [facility] Te[facility] DO   4 months ago COVID-19   [facility] Teodora Medici, DO   7 months ago Type 2 diabetes mellitus without complication, without long-term current use of insulin [facility])   [facility] Teod[facility] Medic[facility], DO   9 months ago AKI (acute kidney injury) [facility])   [place], DO   9 months ago Constipation, unspecified constipation type   [place], PA-C       Future Appointments             In 2 months  [facility], [place]   In 4 months Teodora Medici, [facility], [place]

## 2022-12-08 NOTE — Telephone Encounter (Signed)
Sent yesterday

## 2023-01-21 ENCOUNTER — Other Ambulatory Visit: Payer: Self-pay | Admitting: Internal Medicine

## 2023-01-21 DIAGNOSIS — E119 Type 2 diabetes mellitus without complications: Secondary | ICD-10-CM

## 2023-01-21 DIAGNOSIS — E782 Mixed hyperlipidemia: Secondary | ICD-10-CM

## 2023-01-21 NOTE — Telephone Encounter (Signed)
Requested Prescriptions  Pending Prescriptions Disp Refills   ezetimibe (ZETIA) 10 MG tablet [Pharmacy Med Name: EZETIMIBE 10 MG TAB] 90 tablet 0    Sig: TAKE 1 TABLET BY MOUTH DAILY     Cardiovascular:  Antilipid - Sterol Transport Inhibitors Failed - 01/21/2023  3:10 PM      Failed - Lipid Panel in normal range within the last 12 months    Cholesterol, Total  Date Value Ref Range Status  01/12/2016 151 100 - 199 mg/dL Final   Cholesterol  Date Value Ref Range Status  04/23/2022 150 <200 mg/dL Final   LDL Cholesterol (Calc)  Date Value Ref Range Status  04/23/2022 86 mg/dL (calc) Final    Comment:    Reference range: <100 . Desirable range <100 mg/dL for primary prevention;   <70 mg/dL for patients with CHD or diabetic patients  with > or = 2 CHD risk factors. Marland Kitchen LDL-C is now calculated using the Martin-Hopkins  calculation, which is a validated novel method providing  better accuracy than the Friedewald equation in the  estimation of LDL-C.  Cresenciano Genre et al. Annamaria Helling. 8115;726(20): 2061-2068  (http://education.[employer].com/faq/FAQ164)    HDL  Date Value Ref Range Status  04/23/2022 39 (L) > OR = 40 mg/dL Final  01/12/2016 37 (L) >39 mg/dL Final   Triglycerides  Date Value Ref Range Status  04/23/2022 146 <150 mg/dL Final         Passed - AST in normal range and within 360 days    AST  Date Value Ref Range Status  11/30/2022 23 15 - 41 U/L Final         Passed - ALT in normal range and within 360 days    ALT  Date Value Ref Range Status  11/30/2022 30 0 - 44 U/L Final         Passed - Patient is not pregnant      Passed - Valid encounter within last 12 months    Recent Outpatient Visits           2 months ago Type 2 diabetes mellitus with hyperglycemia, without long-term current use of insulin [facility])   [facility] Teodora Medici, DO   6 months ago [facility] Teodora Medici, DO    9 months ago Type 2 diabetes mellitus without complication, without long-term current use of insulin [facility])   [place], DO   10 months ago AKI (acute kidney injury) [facility])   [facility] Rory Percy M, DO   10 months ago Constipation, unspecified constipation type   [place], PA-C       Future Appointments             In 3 weeks  [facility], [place]   In 3 months Teodora Medici, [facility], [place] [facility]

## 2023-01-28 DIAGNOSIS — R339 Retention of urine, unspecified: Secondary | ICD-10-CM | POA: Diagnosis not present

## 2023-02-15 DIAGNOSIS — N390 Urinary tract infection, site not specified: Secondary | ICD-10-CM | POA: Diagnosis not present

## 2023-02-15 DIAGNOSIS — R319 Hematuria, unspecified: Secondary | ICD-10-CM | POA: Diagnosis not present

## 2023-02-17 ENCOUNTER — Telehealth: Payer: Self-pay

## 2023-02-17 ENCOUNTER — Ambulatory Visit (INDEPENDENT_AMBULATORY_CARE_PROVIDER_SITE_OTHER): Payer: Medicare PPO

## 2023-02-17 ENCOUNTER — Other Ambulatory Visit: Payer: Self-pay

## 2023-02-17 VITALS — Ht 74.0 in | Wt 225.0 lb

## 2023-02-17 DIAGNOSIS — Z0000 Encounter for general adult medical examination without abnormal findings: Secondary | ICD-10-CM

## 2023-02-17 DIAGNOSIS — Z1211 Encounter for screening for malignant neoplasm of colon: Secondary | ICD-10-CM

## 2023-02-17 MED ORDER — NA SULFATE-K SULFATE-MG SULF 17.5-3.13-1.6 GM/177ML PO SOLN: 1.0000 | Freq: Once | ORAL | 0 refills | Status: AC

## 2023-02-17 NOTE — Patient Instructions (Signed)
[name] , Thank you for taking time to come for your Medicare Wellness Visit. I appreciate your ongoing commitment to your health goals. Please review the following plan we discussed and let me know if I can assist you in the future.   These are the goals we discussed:  Goals      Exercise 150 minutes per week (moderate activity)     Continue to remain active by jogging, or doing other strengthening exercises for a minimum of 150 minutes per week.        This is a list of the screening recommended for you and due dates:  Health Maintenance  Topic Date Due   Zoster (Shingles) Vaccine (1 of 2) Never done   Flu Shot  07/27/2022   COVID-19 Vaccine (6 - 2023-24 season) 08/27/2022   Complete foot exam   04/24/2023   Hemoglobin A1C  04/26/2023   Colon Cancer Screening  08/09/2023   Eye exam for diabetics  10/01/2023   Yearly kidney health urinalysis for diabetes  10/26/2023   Yearly kidney function blood test for diabetes  12/01/2023   Medicare Annual Wellness Visit  02/18/2024   DTaP/Tdap/Td vaccine (2 - Td or Tdap) 11/08/2024   Pneumonia Vaccine  Completed   Hepatitis C Screening: USPSTF Recommendation to screen - Ages 28-79 yo.  Completed   HPV Vaccine  Aged Out    Advanced directives: yes  Conditions/risks identified: none  Next appointment: Follow up in one year for your annual wellness visit. 02/23/2024 @ 10:15am telephone  Preventive Care 65 Years and Older, Male  Preventive care refers to lifestyle choices and visits with your health care provider that can promote health and wellness. What does preventive care include? A yearly physical exam. This is also called an annual well check. Dental exams once or twice a year. Routine eye exams. Ask your health care provider how often you should have your eyes checked. Personal lifestyle choices, including: Daily care of your teeth and gums. Regular physical activity. Eating a healthy diet. Avoiding tobacco and drug  use. Limiting alcohol use. Practicing safe sex. Taking low doses of aspirin every day. Taking vitamin and mineral supplements as recommended by your health care provider. What happens during an annual well check? The services and screenings done by your health care provider during your annual well check will depend on your age, overall health, lifestyle risk factors, and family history of disease. Counseling  Your health care provider may ask you questions about your: Alcohol use. Tobacco use. Drug use. Emotional well-being. Home and relationship well-being. Sexual activity. Eating habits. History of falls. Memory and ability to understand (cognition). Work and work Statistician. Screening  You may have the following tests or measurements: Height, weight, and BMI. Blood pressure. Lipid and cholesterol levels. These may be checked every 5 years, or more frequently if you are over 51 years old. Skin check. Lung cancer screening. You may have this screening every year starting at age 30 if you have a 30-pack-year history of smoking and currently smoke or have quit within the past 15 years. Fecal occult blood test (FOBT) of the stool. You may have this test every year starting at age 53. Flexible sigmoidoscopy or colonoscopy. You may have a sigmoidoscopy every 5 years or a colonoscopy every 10 years starting at age 66. Prostate cancer screening. Recommendations will vary depending on your family history and other risks. Hepatitis C blood test. Hepatitis B blood test. Sexually transmitted disease (STD) testing. Diabetes screening.  This is done by checking your blood sugar (glucose) after you have not eaten for a while (fasting). You may have this done every 1-3 years. Abdominal aortic aneurysm (AAA) screening. You may need this if you are a current or former smoker. Osteoporosis. You may be screened starting at age 73 if you are at high risk. Talk with your health care provider about  your test results, treatment options, and if necessary, the need for more tests. Vaccines  Your health care provider may recommend certain vaccines, such as: Influenza vaccine. This is recommended every year. Tetanus, diphtheria, and acellular pertussis (Tdap, Td) vaccine. You may need a Td booster every 10 years. Zoster vaccine. You may need this after age 79. Pneumococcal 13-valent conjugate (PCV13) vaccine. One dose is recommended after age 49. Pneumococcal polysaccharide (PPSV23) vaccine. One dose is recommended after age 31. Talk to your health care provider about which screenings and vaccines you need and how often you need them. This information is not intended to replace advice given to you by your health care provider. Make sure you discuss any questions you have with your health care provider. Document Released: 01/09/2016 Document Revised: 09/01/2016 Document Reviewed: 10/14/2015 Elsevier Interactive Patient Education  2017 [place] Prevention in the Home Falls can cause injuries. They can happen to people of all ages. There are many things you can do to make your home safe and to help prevent falls. What can I do on the outside of my home? Regularly fix the edges of walkways and driveways and fix any cracks. Remove anything that might make you trip as you walk through a door, such as a raised step or threshold. Trim any bushes or trees on the path to your home. Use bright outdoor lighting. Clear any walking paths of anything that might make someone trip, such as rocks or tools. Regularly check to see if handrails are loose or broken. Make sure that both sides of any steps have handrails. Any raised decks and porches should have guardrails on the edges. Have any leaves, snow, or ice cleared regularly. Use sand or salt on walking paths during winter. Clean up any spills in your garage right away. This includes oil or grease spills. What can I do in the bathroom? Use  night lights. Install grab bars by the toilet and in the tub and shower. Do not use towel bars as grab bars. Use non-skid mats or decals in the tub or shower. If you need to sit down in the shower, use a plastic, non-slip stool. Keep the floor dry. Clean up any water that spills on the floor as soon as it happens. Remove soap buildup in the tub or shower regularly. Attach bath mats securely with double-sided non-slip rug tape. Do not have throw rugs and other things on the floor that can make you trip. What can I do in the bedroom? Use night lights. Make sure that you have a light by your bed that is easy to reach. Do not use any sheets or blankets that are too big for your bed. They should not hang down onto the floor. Have a firm chair that has side arms. You can use this for support while you get dressed. Do not have throw rugs and other things on the floor that can make you trip. What can I do in the kitchen? Clean up any spills right away. Avoid walking on wet floors. Keep items that you use a lot in easy-to-reach places. If  you need to reach something above you, use a strong step stool that has a grab bar. Keep electrical cords out of the way. Do not use floor polish or wax that makes floors slippery. If you must use wax, use non-skid floor wax. Do not have throw rugs and other things on the floor that can make you trip. What can I do with my stairs? Do not leave any items on the stairs. Make sure that there are handrails on both sides of the stairs and use them. Fix handrails that are broken or loose. Make sure that handrails are as long as the stairways. Check any carpeting to make sure that it is firmly attached to the stairs. Fix any carpet that is loose or worn. Avoid having throw rugs at the top or bottom of the stairs. If you do have throw rugs, attach them to the floor with carpet tape. Make sure that you have a light switch at the top of the stairs and the bottom of the  stairs. If you do not have them, ask someone to add them for you. What else can I do to help prevent falls? Wear shoes that: Do not have high heels. Have rubber bottoms. Are comfortable and fit you well. Are closed at the toe. Do not wear sandals. If you use a stepladder: Make sure that it is fully opened. Do not climb a closed stepladder. Make sure that both sides of the stepladder are locked into place. Ask someone to hold it for you, if possible. Clearly mark and make sure that you can see: Any grab bars or handrails. First and last steps. Where the edge of each step is. Use tools that help you move around (mobility aids) if they are needed. These include: Canes. Walkers. Scooters. Crutches. Turn on the lights when you go into a dark area. Replace any light bulbs as soon as they burn out. Set up your furniture so you have a clear path. Avoid moving your furniture around. If any of your floors are uneven, fix them. If there are any pets around you, be aware of where they are. Review your medicines with your doctor. Some medicines can make you feel dizzy. This can increase your chance of falling. Ask your doctor what other things that you can do to help prevent falls. This information is not intended to replace advice given to you by your health care provider. Make sure you discuss any questions you have with your health care provider. Document Released: 10/09/2009 Document Revised: 05/20/2016 Document Reviewed: 01/17/2015 Elsevier Interactive Patient Education  2017 [employer].

## 2023-02-17 NOTE — Telephone Encounter (Signed)
Gastroenterology Pre-Procedure Review  Request Date: 08/01/23 Requesting Physician: Dr. Marius Ditch  PATIENT REVIEW QUESTIONS: The patient responded to the following health history questions as indicated:    1. Are you having any GI issues? no 2. Do you have a personal history of Polyps? Not on the last colonoscopy which was 2019 3. Do you have a family history of Colon Cancer or Polyps? no 4. Diabetes Mellitus? yes (controlled by diet) 5. Joint replacements in the past 12 months?no 6. Major health problems in the past 3 months?no 7. Any artificial heart valves, MVP, or defibrillator?no    MEDICATIONS & ALLERGIES:    Patient reports the following regarding taking any anticoagulation/antiplatelet therapy:   Plavix, Coumadin, Eliquis, Xarelto, Lovenox, Pradaxa, Brilinta, or Effient? no Aspirin? no  Patient confirms/reports the following medications:  Current Outpatient Medications  Medication Sig Dispense Refill   Accu-Chek Softclix Lancets lancets USE AS DIRECTED TO MONITOR FASTING BLOODGLUCOSE ONCE DAILY AS NEEDED 100 each 3   acetaminophen (TYLENOL) 325 MG tablet Take 2 tablets (650 mg total) by mouth every 6 (six) hours as needed for moderate pain, mild pain or fever.     Blood Glucose Monitoring Suppl (ACCU-CHEK GUIDE) w/Device KIT See admin instructions.     ciprofloxacin (CIPRO) 500 MG tablet Take 1 tablet (500 mg total) by mouth 2 (two) times daily. 14 tablet 0   ezetimibe (ZETIA) 10 MG tablet TAKE 1 TABLET BY MOUTH DAILY 90 tablet 0   finasteride (PROSCAR) 5 MG tablet Take 1 tablet (5 mg total) by mouth daily. 30 tablet 1   fluticasone (FLONASE) 50 MCG/ACT nasal spray Place 2 sprays into both nostrils daily. (Patient not taking: Reported on 02/17/2023) 16 g 6   glucose blood (ACCU-CHEK GUIDE) test strip USE AS DIRECTED TO MONITOR FASTING BLOODGLUCOSE ONCE DAILY AS NEEDED 100 each 0   levothyroxine (SYNTHROID, LEVOTHROID) 150 MCG tablet Take 1 tablet by mouth daily.     polyethylene  glycol (MIRALAX / GLYCOLAX) 17 g packet Take 17 g by mouth daily as needed for mild constipation or moderate constipation. (Patient not taking: Reported on 02/17/2023) 14 each 0   tamsulosin (FLOMAX) 0.4 MG CAPS capsule Take 1 capsule (0.4 mg total) by mouth daily. 30 capsule 1   No current facility-administered medications for this visit.    Patient confirms/reports the following allergies:  Allergies  Allergen Reactions   Statins     Elevated CK with dark urine   Erythromycin Rash    No orders of the defined types were placed in this encounter.   AUTHORIZATION INFORMATION Primary Insurance: 1D#: Group #:  Secondary Insurance: 1D#: Group #:  SCHEDULE INFORMATION: Date: 08/01/23 Time: Location: [facility]

## 2023-02-17 NOTE — Progress Notes (Addendum)
I connected with  [name] on 02/17/23 by a audio telephone and verified that I am speaking with the correct person using two identifiers.  Patient Location: Home  Provider Location: Office/Clinic  I discussed the limitations of evaluation and management by telemedicine. The patient expressed understanding and agreed to proceed.  Subjective:   Alan Holt is a 72 y.o. male who presents for Medicare Annual/Subsequent preventive examination.  Review of Systems    Cardiac Risk Factors include: advanced age (>38mn, >>89women);hypertension;diabetes mellitus;male gender;dyslipidemia    Objective:    Today's Vitals   02/17/23 1048  Weight: 225 lb (102.1 kg)  Height: 6' 2"$  (1.88 m)   Body mass index is 28.89 kg/m.     02/17/2023   11:02 AM 11/30/2022    7:21 AM 04/02/2022   12:55 PM 04/02/2022   11:48 AM 03/02/2022    6:00 PM 03/02/2022    7:39 AM 02/16/2022   10:54 AM  Advanced Directives  Does Patient Have a Medical Advance Directive? Yes No No No Yes Yes Yes  Type of AP[place]amed[place]of [place] will    Healthcare Power of [place] will  Does patient want to make changes to medical advance directive?     No - Patient declined    Copy of [place] Chart? No - copy requested    No - copy requested No - copy requested No - copy requested  Would patient like information on creating a medical advance directive?  No - Patient declined No - Patient declined No - Patient declined       Current Medications (verified) Outpatient Encounter Medications as of 02/17/2023  Medication Sig   Accu-Chek Softclix Lancets lancets USE AS DIRECTED TO MONITOR FASTING BLOODGLUCOSE ONCE DAILY AS NEEDED   acetaminophen (TYLENOL) 325 MG tablet Take 2 tablets (650 mg total) by mouth every 6 (six) hours as needed for moderate pain, mild pain or fever.   Blood Glucose Monitoring Suppl  (ACCU-CHEK GUIDE) w/Device KIT See admin instructions.   ciprofloxacin (CIPRO) 500 MG tablet Take 1 tablet (500 mg total) by mouth 2 (two) times daily.   ezetimibe (ZETIA) 10 MG tablet TAKE 1 TABLET BY MOUTH DAILY   finasteride (PROSCAR) 5 MG tablet Take 1 tablet (5 mg total) by mouth daily.   glucose blood (ACCU-CHEK GUIDE) test strip USE AS DIRECTED TO MONITOR FASTING BLOODGLUCOSE ONCE DAILY AS NEEDED   levothyroxine (SYNTHROID, LEVOTHROID) 150 MCG tablet Take 1 tablet by mouth daily.   tamsulosin (FLOMAX) 0.4 MG CAPS capsule Take 1 capsule (0.4 mg total) by mouth daily.   fluticasone (FLONASE) 50 MCG/ACT nasal spray Place 2 sprays into both nostrils daily. (Patient not taking: Reported on 02/17/2023)   polyethylene glycol (MIRALAX / GLYCOLAX) 17 g packet Take 17 g by mouth daily as needed for mild constipation or moderate constipation. (Patient not taking: Reported on 02/17/2023)   No facility-administered encounter medications on file as of 02/17/2023.    Allergies (verified) Statins and Erythromycin   History: Past Medical History:  Diagnosis Date   Allergy    Diabetes mellitus without complication ([place]    Dysplastic nevus 02/25/2014   L mid ant thigh - mild   Dysplastic nevus 02/25/2014   R med popliteal - mild   Dysplastic nevus 02/25/2014   R med knee - mild   Dysplastic nevus 12/30/2016   R pectoral 2.0 cm med to areola -  mod   Dysplastic nevus 12/30/2016   L dorsum prox forearm near antecubital - mod   Elevated BP without diagnosis of hypertension 11/10/2016   Hypertension    Prediabetes 05/10/2018   Sleep apnea    Thyroid disease    Past Surgical History:  Procedure Laterality Date   HERNIA REPAIR     childhood   KNEE SURGERY Left 1993   MOUTH SURGERY  04/05/2018   bone graft, 3 teeth pulled   THYROID SURGERY  2015   TOTAL THYROIDECTOMY Bilateral 07/15/2014   Dr. Kathyrn Sheriff.    Family History  Problem Relation Age of Onset   Alcohol abuse Father    Stroke  Father    Diabetes Mother    Diabetes Brother    Prostate cancer Brother    Heart attack Brother    Social History   Socioeconomic History   Marital status: Married    Spouse name: Not on file   Number of children: Not on file   Years of education: Not on file   Highest education level: Master's degree (e.g., MA, MS, MEng, MEd, MSW, MBA)  Occupational History    Comment: retired Careers information officer  Tobacco Use   Smoking status: Never   Smokeless tobacco: Never  Vaping Use   Vaping Use: Never used  Substance and Sexual Activity   Alcohol use: No    Alcohol/week: 0.0 standard drinks of alcohol   Drug use: No   Sexual activity: Not Currently  Other Topics Concern   Not on file  Social History Narrative   Not on file   Social Determinants of Health   Financial Resource Strain: Low Risk  (02/17/2023)   Overall Financial Resource Strain (CARDIA)    Difficulty of Paying Living Expenses: Not hard at all  Food Insecurity: No Food Insecurity (02/17/2023)   Hunger Vital Sign    Worried About Running Out of Food in the Last Year: Never true    [place] in the Last Year: Never true  Transportation Needs: No Transportation Needs (02/17/2023)   PRAPARE - Hydrologist (Medical): No    Lack of Transportation (Non-Medical): No  Physical Activity: Sufficiently Active (02/17/2023)   Exercise Vital Sign    Days of Exercise per Week: 5 days    Minutes of Exercise per Session: 60 min  Stress: No Stress Concern Present (02/17/2023)   [place]    Feeling of Stress : Only a little  Social Connections: Moderately Integrated (02/17/2023)   Social Connection and Isolation Panel [NHANES]    Frequency of Communication with Friends and Family: More than three times a week    Frequency of Social Gatherings with Friends and Family: More than three times a week    Attends Religious Services: More than 4 times  per year    Active Member of [employer] or Organizations: No    Attends Music therapist: Never    Marital Status: Married    Tobacco Counseling Counseling given: Not Answered   Clinical Intake:  Pre-visit preparation completed: Yes  Pain : No/denies pain     BMI - recorded: 28.89 Nutritional Status: BMI 25 -29 Overweight Nutritional Risks: None Diabetes: Yes CBG done?: No (pt at home: 139 in am) Did pt. bring in CBG monitor from home?: No (telephone)  How often do you need to have someone help you when you read instructions, pamphlets, or other written materials from  your doctor or pharmacy?: 1 - Never  Diabetic?yes  Interpreter Needed?: No  Information entered by :: B.Havilah Topor,LPN   Activities of Daily Living    02/17/2023   11:02 AM 10/25/2022    8:15 AM  In your present state of health, do you have any difficulty performing the following activities:  Hearing? 0 1  Vision? 0 0  Difficulty concentrating or making decisions? 0 0  Walking or climbing stairs? 0 0  Dressing or bathing? 0 0  Doing errands, shopping? 0 0  Preparing Food and eating ? N   Using the Toilet? N   In the past six months, have you accidently leaked urine? N   Comment self caths   Do you have problems with loss of bowel control? N   Managing your Medications? N   Managing your Finances? N   Housekeeping or managing your Housekeeping? N     Patient Care Team: Teodora Medici, DO as PCP - General (Internal Medicine) Margaretha Sheffield, MD as Consulting Physician (Otolaryngology) Ralene Bathe, MD (Dermatology)  Indicate any recent Medical Services you may have received from other than [facility] providers in the past year (date may be approximate).     Assessment:   This is a routine wellness examination for Pardeep.  Hearing/Vision screen Hearing Screening - Comments:: Hearing adequate with hearing aides placed in November 2023 Vision Screening - Comments:: Adequate vision  w/glasses  [facility]  Dietary issues and exercise activities discussed: Current Exercise Habits: Home exercise routine, Type of exercise: walking, Time (Minutes): > 60, Frequency (Times/Week): 4, Weekly Exercise (Minutes/Week): 240, Intensity: Mild, Exercise limited by: None identified   Goals Addressed             This Visit's Progress    Exercise 150 minutes per week (moderate activity)   On track    Continue to remain active by jogging, or doing other strengthening exercises for a minimum of 150 minutes per week.       Depression Screen    02/17/2023   10:55 AM 10/25/2022    8:15 AM 07/12/2022    1:51 PM 04/23/2022    8:23 AM 03/08/2022    9:39 AM 03/01/2022    7:59 AM 02/16/2022   10:54 AM  PHQ 2/9 Scores  PHQ - 2 Score 0 0 0 0 0 0 0  PHQ- 9 Score  0 0 0 0 0     Fall Risk    02/17/2023   10:51 AM 10/25/2022    8:10 AM 07/12/2022    1:51 PM 04/23/2022    8:23 AM 03/08/2022    9:38 AM  [place] in the past year? 0 0 0 0 0  Number falls in past yr: 0 0 0 0 0  Injury with Fall? 0 0 0 0 0  Risk for fall due to : No Fall Risks      Follow up Education provided;Falls prevention discussed        FALL RISK PREVENTION PERTAINING TO THE HOME:  Any stairs in or around the home? No  If so, are there any without handrails? No  Home free of loose throw rugs in walkways, pet beds, electrical cords, etc? Yes  Adequate lighting in your home to reduce risk of falls? Yes   ASSISTIVE DEVICES UTILIZED TO PREVENT FALLS:  Life alert? No  Use of a cane, walker or w/c? No  Grab bars in the bathroom? Yes  Shower chair or bench in  shower? No  Elevated toilet seat or a handicapped toilet? No   Cognitive Function:        02/17/2023   11:04 AM 02/06/2019    2:17 PM  6CIT Screen  What Year? 0 points 0 points  What month? 0 points 0 points  What time? 0 points 0 points  Count back from 20 0 points 0 points  Months in reverse 0 points 0 points  Repeat phrase 0 points 0  points  Total Score 0 points 0 points    Immunizations Immunization History  Administered Date(s) Administered   Fluad Quad(high Dose 65+) 10/12/2019   Influenza, High Dose Seasonal PF 10/23/2018, 10/20/2021   Influenza-Unspecified 09/27/2015, 10/05/2016, 09/26/2020   PFIZER(Purple Top)SARS-COV-2 Vaccination 01/21/2020, 02/12/2020, 10/13/2020, 05/27/2021   PNEUMOCOCCAL CONJUGATE-20 04/23/2022   Pfizer Covid-19 Vaccine Bivalent Booster 46yr & up 10/20/2021   Pneumococcal Conjugate-13 10/26/2017   Pneumococcal Polysaccharide-23 11/09/2018   Tdap 11/08/2014    TDAP status: Up to date  Flu Vaccine status: Up to date  Pneumococcal vaccine status: Up to date  Covid-19 vaccine status: Completed vaccines  Qualifies for Shingles Vaccine? Yes   Zostavax completed Yes   Shingrix Completed?: Yes  Screening Tests Health Maintenance  Topic Date Due   Zoster Vaccines- Shingrix (1 of 2) Never done   INFLUENZA VACCINE  07/27/2022   COVID-19 Vaccine (6 - 2023-24 season) 08/27/2022   FOOT EXAM  04/24/2023   HEMOGLOBIN A1C  04/26/2023   COLONOSCOPY (Pts 45-498yrInsurance coverage will need to be confirmed)  08/09/2023   OPHTHALMOLOGY EXAM  10/01/2023   Diabetic kidney evaluation - Urine ACR  10/26/2023   Diabetic kidney evaluation - eGFR measurement  12/01/2023   Medicare Annual Wellness (AWV)  02/18/2024   DTaP/Tdap/Td (2 - Td or Tdap) 11/08/2024   Pneumonia Vaccine 6552Years old  Completed   Hepatitis C Screening  Completed   HPV VACCINES  Aged Out    Health Maintenance  Health Maintenance Due  Topic Date Due   Zoster Vaccines- Shingrix (1 of 2) Never done   INFLUENZA VACCINE  07/27/2022   COVID-19 Vaccine (6 - 2023-24 season) 08/27/2022    Colorectal cancer screening: Type of screening: Colonoscopy. Completed yes. Repeat every 10 years  Lung Cancer Screening: (Low Dose CT Chest recommended if Age 73-80ears, 30 pack-year currently smoking OR have quit w/in 15years.) does  not qualify.   Lung Cancer Screening Referral: no  Additional Screening:  Hepatitis C Screening: does not qualify; Completed no  Vision Screening: Recommended annual ophthalmology exams for early detection of glaucoma and other disorders of the eye. Is the patient up to date with their annual eye exam?  Yes  Who is the provider or what is the name of the office in which the patient attends annual eye exams? [place] with a provider, would they like to be referred to a provider to establish care? No .   Dental Screening: Recommended annual dental exams for proper oral hygiene  Community Resource Referral / Chronic Care Management: CRR required this visit?  No   CCM required this visit?  No      Plan:     I have personally reviewed and noted the following in the patient's chart:   Medical and social history Use of alcohol, tobacco or illicit drugs  Current medications and supplements including opioid prescriptions. Patient is not currently taking opioid prescriptions. Functional ability and status Nutritional status Physical activity Advanced directives List  of other physicians Hospitalizations, surgeries, and ER visits in previous 12 months Vitals Screenings to include cognitive, depression, and falls Referrals and appointments  In addition, I have reviewed and discussed with patient certain preventive protocols, quality metrics, and best practice recommendations. A writtencare plan for preventive services as well as general preventive health recommendations were provided to patient.     Roger Shelter, LPN   [id number]   Nurse Notes: pt is doing well:has no questions or concerns. Pt does relay he does relay that he self-caths without problems but went to Urgent care on Tuesday this week for UTI symptoms and indeed was treated for such. Pt is on Cipro.Encouraged to drink sufficient amounts of water (6-8 8oz).  Referral placed for  colonoscopy due after 07/28/2023.

## 2023-02-22 DIAGNOSIS — G4733 Obstructive sleep apnea (adult) (pediatric): Secondary | ICD-10-CM | POA: Diagnosis not present

## 2023-03-21 DIAGNOSIS — R300 Dysuria: Secondary | ICD-10-CM | POA: Diagnosis not present

## 2023-03-21 DIAGNOSIS — N390 Urinary tract infection, site not specified: Secondary | ICD-10-CM | POA: Diagnosis not present

## 2023-03-30 DIAGNOSIS — R339 Retention of urine, unspecified: Secondary | ICD-10-CM | POA: Diagnosis not present

## 2023-04-25 NOTE — Progress Notes (Unsigned)
Established Patient Office Visit  Subjective   Patient ID: Alan Holt, male    DOB: 10/22/1951  Age: 72 y.o. MRN: [id number]  No chief complaint on file.   HPI  Patient is here to follow-up on chronic medical conditions.  OSA: -Compliant with CPAP, doing well   HLD: -Medications: Zetia 10 mg started at LOV, history of rhabdomyolysis on statin last fall.  Has been on the Zetia now for about 6 months, doing well, denies side effects and is compliant with medication on a daily basis. -Last lipid panel: Lipid Panel     Component Value Date/Time   CHOL 150 04/23/2022 0849   CHOL 151 01/12/2016 0853   TRIG 146 04/23/2022 0849   HDL 39 (L) 04/23/2022 0849   HDL 37 (L) 01/12/2016 0853   CHOLHDL 3.8 04/23/2022 0849   VLDL 27 05/03/2017 0906   LDLCALC 86 04/23/2022 0849   LABVLDL 36 01/12/2016 0853   The 10-year ASCVD risk score (Arnett DK, et al., 2019) is: 39%   Values used to calculate the score:     Age: 86 years     Sex: Male     Is Non-Hispanic African American: No     Diabetic: Yes     Tobacco smoker: No     Systolic Blood Pressure: 138 mmHg     Is BP treated: No     HDL Cholesterol: 39 mg/dL     Total Cholesterol: 150 mg/dL  Diabetes, Type 2: -Last A1c 6.5%, 10/23 -Medications: None, diet controlled.  -Eye exam: UTD, [facility]  -Foot exam: Due -Microalbumin: UTD 10/23 -Statin: No, history of intolerance  -PNA vaccine: Due for Prevnar 20 -Denies symptoms of hypoglycemia, polyuria, polydipsia, numbness extremities, foot ulcers/trauma.  -Exercise: walks 10 miles a week  Hypothyroidism: -Medications: Levothyroxine 150 mcg -S/p thyroidectomy in 2015, enlarged with calcifications  -Patient is compliant with the above medication (s) at the above dose and reports no medication side effects.  -Denies weight changes, cold./heat intolerance, skin changes, anxiety/palpitations  -Last TSH: Follows with endocrinology, was last evaluated last month where his TSH was  normal per the patient.  I am unable to review these results.  He reports no other changes.  BPH/Bladder Outlet obstruction/hypotonic bladder: -Following with urology, last n telemedicine note reviewed from 09/15/2022.  He is doing self-catheterization, about 8 times a day and is overall doing well.  Denies any dysuria, hematuria or general discomfort. -Planning on returning to urology for renal ultrasound on 11/04/2022 -Currently on Flomax 0.4 mg and finasteride 5 mg  Health maintenance: -Blood work due -Flu and COVID boosters later today  Review of Systems  Constitutional:  Negative for chills and fever.  Respiratory:  Negative for shortness of breath.   Cardiovascular:  Negative for chest pain.  Genitourinary:  Negative for dysuria and hematuria.  Musculoskeletal:  Negative for myalgias.  Neurological:  Negative for dizziness and tingling.      Objective:     There were no vitals taken for this visit. BP Readings from Last 3 Encounters:  11/30/22 120/80  10/25/22 136/80  04/27/22 130/82   Wt Readings from Last 3 Encounters:  02/17/23 225 lb (102.1 kg)  11/30/22 225 lb (102.1 kg)  10/25/22 225 lb 1.6 oz (102.1 kg)      Physical Exam Constitutional:      Appearance: Normal appearance.  HENT:     Head: Normocephalic and atraumatic.  Eyes:     Conjunctiva/sclera: Conjunctivae normal.  Cardiovascular:  Rate and Rhythm: Normal rate and regular rhythm.  Pulmonary:     Effort: Pulmonary effort is normal.     Breath sounds: Normal breath sounds.  Musculoskeletal:     Right lower leg: No edema.     Left lower leg: No edema.  Skin:    General: Skin is warm and dry.  Neurological:     General: No focal deficit present.     Mental Status: He is alert. Mental status is at baseline.  Psychiatric:        Mood and Affect: Mood normal.        Behavior: Behavior normal.      No results found for any visits on 04/26/23.   Last CBC Lab Results  Component Value Date    WBC 10.4 11/30/2022   HGB 16.2 11/30/2022   HCT 47.6 11/30/2022   MCV 87.3 11/30/2022   MCH 29.7 11/30/2022   RDW 13.0 11/30/2022   PLT 222 11/30/2022   Last metabolic panel Lab Results  Component Value Date   GLUCOSE 214 (H) 11/30/2022   NA 137 11/30/2022   K 3.5 11/30/2022   CL 108 11/30/2022   CO2 21 (L) 11/30/2022   BUN 19 11/30/2022   CREATININE 0.99 11/30/2022   GFRNONAA >60 11/30/2022   CALCIUM 9.0 11/30/2022   PROT 7.5 11/30/2022   ALBUMIN 4.4 11/30/2022   LABGLOB 2.2 01/12/2016   AGRATIO 2.0 01/12/2016   BILITOT 1.0 11/30/2022   ALKPHOS 70 11/30/2022   AST 23 11/30/2022   ALT 30 11/30/2022   ANIONGAP 8 11/30/2022   Last lipids Lab Results  Component Value Date   CHOL 150 04/23/2022   HDL 39 (L) 04/23/2022   LDLCALC 86 04/23/2022   TRIG 146 04/23/2022   CHOLHDL 3.8 04/23/2022   Last hemoglobin A1c Lab Results  Component Value Date   HGBA1C 6.5 (A) 10/25/2022   Last thyroid functions No results found for: "TSH", "T3TOTAL", "T4TOTAL", "THYROIDAB" Last vitamin D Lab Results  Component Value Date   VD25OH 51 02/01/2018   Last vitamin B12 and Folate No results found for: "VITAMINB12", "FOLATE"    The 10-year ASCVD risk score (Arnett DK, et al., 2019) is: 39%    Assessment & Plan:   1. Type 2 diabetes mellitus with hyperglycemia, without long-term current use of insulin (HCC): A1c in the office today 6.5%, which is virtually stable compared to last time.  Did discuss starting a low-dose metformin to prevent further progression, patient states he will work on his diet and increase his exercise further incidents during the medication.  Plan to recheck A1c in the spring.  Microalbumin due today.  - POCT HgB A1C - Urine Microalbumin w/creat. ratio  2. Hyperlipidemia with low HDL: Doing well on the Zetia 10 mg, plan to check fasting lipid panel at follow-up.  3. Hypotonic bladder: Following with urology at [facility], last note from 09/15/2022 reviewed.   The patient is currently doing self-catheterization and overall is doing well with this.  He is planning on renal ultrasound next month.  Will continue to follow along.   No follow-ups on file.    Margarita Mail, DO

## 2023-04-26 ENCOUNTER — Ambulatory Visit: Payer: Medicare PPO | Admitting: Internal Medicine

## 2023-04-26 ENCOUNTER — Encounter: Payer: Self-pay | Admitting: Internal Medicine

## 2023-04-26 VITALS — BP 134/80 | HR 71 | Temp 97.7°F | Resp 18 | Ht 74.0 in | Wt 220.6 lb

## 2023-04-26 DIAGNOSIS — N320 Bladder-neck obstruction: Secondary | ICD-10-CM

## 2023-04-26 DIAGNOSIS — E119 Type 2 diabetes mellitus without complications: Secondary | ICD-10-CM

## 2023-04-26 DIAGNOSIS — G4733 Obstructive sleep apnea (adult) (pediatric): Secondary | ICD-10-CM | POA: Diagnosis not present

## 2023-04-26 DIAGNOSIS — E039 Hypothyroidism, unspecified: Secondary | ICD-10-CM

## 2023-04-26 DIAGNOSIS — E782 Mixed hyperlipidemia: Secondary | ICD-10-CM | POA: Diagnosis not present

## 2023-04-26 DIAGNOSIS — Z125 Encounter for screening for malignant neoplasm of prostate: Secondary | ICD-10-CM | POA: Diagnosis not present

## 2023-04-26 MED ORDER — EZETIMIBE 10 MG PO TABS: 10.0000 mg | ORAL_TABLET | Freq: Every day | ORAL | 1 refills | Status: DC

## 2023-04-26 MED ORDER — EZETIMIBE 10 MG PO TABS: 10.0000 mg | ORAL_TABLET | Freq: Every day | ORAL | 0 refills | Status: DC

## 2023-04-27 LAB — CBC WITH DIFFERENTIAL/PLATELET
Absolute Monocytes: 400 cells/uL (ref 200–950)
Basophils Absolute: 32 cells/uL (ref 0–200)
Basophils Relative: 0.6 %
Eosinophils Absolute: 49 cells/uL (ref 15–500)
Eosinophils Relative: 0.9 %
HCT: 47.2 % (ref 38.5–50.0)
Hemoglobin: 16 g/dL (ref 13.2–17.1)
Lymphs Abs: 1771 cells/uL (ref 850–3900)
MCH: 30 pg (ref 27.0–33.0)
MCHC: 33.9 g/dL (ref 32.0–36.0)
MCV: 88.6 fL (ref 80.0–100.0)
MPV: 9.6 fL (ref 7.5–12.5)
Monocytes Relative: 7.4 %
Neutro Abs: 3148 cells/uL (ref 1500–7800)
Neutrophils Relative %: 58.3 %
Platelets: 222 10*3/uL (ref 140–400)
RBC: 5.33 10*6/uL (ref 4.20–5.80)
RDW: 12.7 % (ref 11.0–15.0)
Total Lymphocyte: 32.8 %
WBC: 5.4 10*3/uL (ref 3.8–10.8)

## 2023-04-27 LAB — COMPLETE METABOLIC PANEL WITH GFR
AG Ratio: 1.8 (calc) (ref 1.0–2.5)
ALT: 24 U/L (ref 9–46)
AST: 18 U/L (ref 10–35)
Albumin: 4.4 g/dL (ref 3.6–5.1)
Alkaline phosphatase (APISO): 70 U/L (ref 35–144)
BUN: 19 mg/dL (ref 7–25)
CO2: 28 mmol/L (ref 20–32)
Calcium: 9.6 mg/dL (ref 8.6–10.3)
Chloride: 104 mmol/L (ref 98–110)
Creat: 0.91 mg/dL (ref 0.70–1.28)
Globulin: 2.4 g/dL (calc) (ref 1.9–3.7)
Glucose, Bld: 129 mg/dL — ABNORMAL HIGH (ref 65–99)
Potassium: 4.2 mmol/L (ref 3.5–5.3)
Sodium: 139 mmol/L (ref 135–146)
Total Bilirubin: 0.8 mg/dL (ref 0.2–1.2)
Total Protein: 6.8 g/dL (ref 6.1–8.1)
eGFR: 90 mL/min/{1.73_m2} (ref >=60)

## 2023-04-27 LAB — LIPID PANEL
Cholesterol: 140 mg/dL (ref <200)
HDL: 38 mg/dL — ABNORMAL LOW (ref >=40)
LDL Cholesterol (Calc): 75 mg/dL (calc)
Non-HDL Cholesterol (Calc): 102 mg/dL (calc) (ref <130)
Total CHOL/HDL Ratio: 3.7 (calc) (ref <5.0)
Triglycerides: 175 mg/dL — ABNORMAL HIGH (ref <150)

## 2023-04-27 LAB — HEMOGLOBIN A1C
Hgb A1c MFr Bld: 7.2 % of total Hgb — ABNORMAL HIGH (ref <5.7)
Mean Plasma Glucose: 160 mg/dL
eAG (mmol/L): 8.9 mmol/L

## 2023-04-27 LAB — PSA: PSA: 0.77 ng/mL (ref <=4.00)

## 2023-04-28 ENCOUNTER — Other Ambulatory Visit: Payer: Self-pay | Admitting: Internal Medicine

## 2023-04-28 DIAGNOSIS — E119 Type 2 diabetes mellitus without complications: Secondary | ICD-10-CM

## 2023-04-28 DIAGNOSIS — R339 Retention of urine, unspecified: Secondary | ICD-10-CM | POA: Diagnosis not present

## 2023-04-28 MED ORDER — METFORMIN HCL 500 MG PO TABS
500.0000 mg | ORAL_TABLET | Freq: Every day | ORAL | 1 refills | Status: DC
Start: 2023-04-28 — End: 2023-10-26

## 2023-05-07 DIAGNOSIS — R300 Dysuria: Secondary | ICD-10-CM | POA: Diagnosis not present

## 2023-05-25 DIAGNOSIS — R339 Retention of urine, unspecified: Secondary | ICD-10-CM | POA: Diagnosis not present

## 2023-07-08 DIAGNOSIS — R339 Retention of urine, unspecified: Secondary | ICD-10-CM | POA: Diagnosis not present

## 2023-07-19 ENCOUNTER — Encounter: Payer: Self-pay | Admitting: Internal Medicine

## 2023-07-19 ENCOUNTER — Telehealth: Payer: Medicare PPO | Admitting: Nurse Practitioner

## 2023-07-19 ENCOUNTER — Ambulatory Visit: Payer: Self-pay

## 2023-07-19 DIAGNOSIS — U071 COVID-19: Secondary | ICD-10-CM

## 2023-07-19 MED ORDER — NIRMATRELVIR/RITONAVIR (PAXLOVID) TABLET (RENAL DOSING)
2.0000 | ORAL_TABLET | Freq: Two times a day (BID) | ORAL | 0 refills | Status: AC
Start: 2023-07-19 — End: 2023-07-24

## 2023-07-19 NOTE — Telephone Encounter (Signed)
Message from Alan Holt sent at 07/19/2023  8:03 AM EDT  Summary: Home test, positive Covid   Sore throat, sinus drainage, no fever.  Took a home covid test this morning and it came back positive         Chief Complaint: post nasal drainage Symptoms: sore throat, feeling feverish sore throat Frequency: sx started yesterday  Pertinent Negatives: Patient denies chills, fatigue, headache, loss of smell or taste, muscle pain Disposition: [] ED /[] Urgent Care (no appt availability in office) / [] Appointment(In office/virtual)/ [x]  [facility]/ [] Home Care/ [] Refused Recommended Disposition /[] [facility]/ []  Follow-up with PCP Additional Notes: no appts in office - MyChart VV made for pt.   Reason for Disposition  [1] HIGH RISK patient (e.g., weak immune system, age > 64 years, obesity with BMI 30 or higher, pregnant, chronic lung disease or other chronic medical condition) AND [2] COVID symptoms (e.g., cough, fever)  (Exceptions: Already seen by PCP and no new or worsening symptoms.)  Answer Assessment - Initial Assessment Questions 1. COVID-19 DIAGNOSIS: "How do you know that you have COVID?" (e.g., positive lab test or self-test, diagnosed by doctor or NP/PA, symptoms after exposure).     Self test 2. COVID-19 EXPOSURE: "Was there any known exposure to COVID before the symptoms began?" CDC Definition of close contact: within 6 feet (2 meters) for a total of 15 minutes or more over a 24-hour period.      No idea 3. ONSET: "When did the COVID-19 symptoms start?"      yesterday  4. WORST SYMPTOM: "What is your worst symptom?" (e.g., cough, fever, shortness of breath, muscle aches)     Post nasal drainage  5. COUGH: "Do you have a cough?" If Yes, ask: "How bad is the cough?"       Cough  6. FEVER: "Do you have a fever?" If Yes, ask: "What is your temperature, how was it measured, and when did it start?"     No 98.2 and he runs 97 always 7. RESPIRATORY  STATUS: "Describe your breathing?" (e.g., normal; shortness of breath, wheezing, unable to speak)      Normal  8. BETTER-SAME-WORSE: "Are you getting better, staying the same or getting worse compared to yesterday?"  If getting worse, ask, "In what way?"     worse 9. OTHER SYMPTOMS: "Do you have any other symptoms?"  (e.g., chills, fatigue, headache, loss of smell or taste, muscle pain, sore throat)     Sinus drainage, feeling warm, sore throat 10. HIGH RISK DISEASE: "Do you have any chronic medical problems?" (e.g., asthma, heart or lung disease, weak immune system, obesity, etc.)       HTN, diabetes, surgical hypothyroid age  Protocols used: Coronavirus (COVID-19) Diagnosed or Suspected-A-AH

## 2023-07-19 NOTE — Progress Notes (Signed)
Virtual Visit Consent   Alan Holt, you are scheduled for a virtual visit with a [facility] provider today. Just as with appointments in the office, your consent must be obtained to participate. Your consent will be active for this visit and any virtual visit you may have with one of our providers in the next 365 days. If you have a MyChart account, a copy of this consent can be sent to you electronically.  As this is a virtual visit, video technology does not allow for your provider to perform a traditional examination. This may limit your provider's ability to fully assess your condition. If your provider identifies any concerns that need to be evaluated in person or the need to arrange testing (such as labs, EKG, etc.), we will make arrangements to do so. Although advances in technology are sophisticated, we cannot ensure that it will always work on either your end or our end. If the connection with a video visit is poor, the visit may have to be switched to a telephone visit. With either a video or telephone visit, we are not always able to ensure that we have a secure connection.  By engaging in this virtual visit, you consent to the provision of healthcare and authorize for your insurance to be billed (if applicable) for the services provided during this visit. Depending on your insurance coverage, you may receive a charge related to this service.  I need to obtain your verbal consent now. Are you willing to proceed with your visit today? Corie Vavra [place] has provided verbal consent on 07/19/2023 for a virtual visit (video or telephone). Viviano Simas, FNP  Date: 07/19/2023 9:06 AM  Virtual Visit via Video Note   I, Viviano Simas, connected with  [name]  ([id number], July 07, 1951) on 07/19/23 at  9:15 AM EDT by a video-enabled telemedicine application and verified that I am speaking with the correct person using two identifiers.  Location: Patient: Virtual Visit Location Patient:  Home Provider: Virtual Visit Location Provider: Home Office   I discussed the limitations of evaluation and management by telemedicine and the availability of in person appointments. The patient expressed understanding and agreed to proceed.    History of Present Illness: Alan Holt is a 72 y.o. who identifies as a male who was assigned male at birth, and is being seen today for assistance after testing positive for COVID  Symptom onset was yesterday  Symptoms include sinus drainage, fever, PND, cough, sneezing  He has had COVID in the past one year ago  Denies any complications   He has been vaccinated for COVID   History of HTN, DM2   In March of last year he suffered AKI that was secondary to failure to void (bladder outlet syndrome) He now self catheterizes and has had a return to normal kidney function as of May 2023  He is treated at [facility]   GFR in December was >60  BUN/Creatinine tested in April with eGFR (90) all normal     Problems:  Patient Active Problem List   Diagnosis Date Noted   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   Hypokalemia 03/03/2022   Hypomagnesemia 03/03/2022   AKI (acute kidney injury) (HCC) 03/02/2022   Severe bilateral hydroureteronephrosis 03/02/2022   BPH (benign prostatic hyperplasia) 03/02/2022   Hypercalcemia 03/02/2022   HTN (hypertension) 03/02/2022   Hematuria 03/02/2022   Renal calculus, right, Punctate nonobstructing  03/02/2022   Abdominal mass, RLQ (right lower quadrant)  03/01/2022   Uncontrolled type 2 diabetes mellitus with hyperglycemia (HCC) 03/11/2021   Elevated hemoglobin (HCC) 03/11/2021   DM2 (diabetes mellitus, type 2) (HCC) 02/06/2021   Sleep apnea 06/01/2018   Prediabetes 05/10/2018   Increased prostate specific antigen (PSA) velocity 05/08/2018   Obesity (BMI 30.0-34.9) 05/08/2018   Mixed hyperlipidemia 08/10/2016   Low testosterone 07/20/2016   Hypothyroidism 07/09/2015     Allergies:  Allergies  Allergen Reactions   Statins     Elevated CK with dark urine   Erythromycin Rash   Medications:  Current Outpatient Medications:    Accu-Chek Softclix Lancets lancets, USE AS DIRECTED TO MONITOR FASTING BLOODGLUCOSE ONCE DAILY AS NEEDED, Disp: 100 each, Rfl: 3   acetaminophen (TYLENOL) 325 MG tablet, Take 2 tablets (650 mg total) by mouth every 6 (six) hours as needed for moderate pain, mild pain or fever., Disp: , Rfl:    Blood Glucose Monitoring Suppl (ACCU-CHEK GUIDE) w/Device KIT, See admin instructions., Disp: , Rfl:    ciprofloxacin (CIPRO) 500 MG tablet, Take 1 tablet (500 mg total) by mouth 2 (two) times daily., Disp: 14 tablet, Rfl: 0   ezetimibe (ZETIA) 10 MG tablet, Take 1 tablet (10 mg total) by mouth daily., Disp: 90 tablet, Rfl: 1   finasteride (PROSCAR) 5 MG tablet, Take 1 tablet (5 mg total) by mouth daily., Disp: 30 tablet, Rfl: 1   glucose blood (ACCU-CHEK GUIDE) test strip, USE AS DIRECTED TO MONITOR FASTING BLOODGLUCOSE ONCE DAILY AS NEEDED, Disp: 100 each, Rfl: 0   levothyroxine (SYNTHROID, LEVOTHROID) 150 MCG tablet, Take 1 tablet by mouth daily., Disp: , Rfl:    metFORMIN (GLUCOPHAGE) 500 MG tablet, Take 1 tablet (500 mg total) by mouth daily with breakfast., Disp: 90 tablet, Rfl: 1   tamsulosin (FLOMAX) 0.4 MG CAPS capsule, Take 1 capsule (0.4 mg total) by mouth daily., Disp: 30 capsule, Rfl: 1  Observations/Objective: Patient is well-developed, well-nourished in no acute distress.  Resting comfortably  at home.  Head is normocephalic, atraumatic.  No labored breathing.  Speech is clear and coherent with logical content.  Patient is alert and oriented at baseline.    Assessment and Plan:  1. COVID-19 Push fluids and monitor hydration   Take anti-viral with food  - nirmatrelvir/ritonavir, renal dosing, (PAXLOVID) 10 x 150 MG & 10 x 100MG  TABS; Take 2 tablets by mouth 2 (two) times daily for 5 days. (Take nirmatrelvir 150 mg one tablet  twice daily for 5 days and ritonavir 100 mg one tablet twice daily for 5 days) Patient GFR is 90 (history of AKI)  Dispense: 20 tablet; Refill: 0     Follow Up Instructions: I discussed the assessment and treatment plan with the patient. The patient was provided an opportunity to ask questions and all were answered. The patient agreed with the plan and demonstrated an understanding of the instructions.  A copy of instructions were sent to the patient via MyChart unless otherwise noted below.    The patient was advised to call back or seek an in-person evaluation if the symptoms worsen or if the condition fails to improve as anticipated.  Time:  I spent 15 minutes with the patient via telehealth technology discussing the above problems/concerns.    Viviano Simas, FNP

## 2023-07-25 ENCOUNTER — Encounter: Payer: Self-pay | Admitting: Gastroenterology

## 2023-07-26 ENCOUNTER — Telehealth: Payer: Self-pay

## 2023-07-26 DIAGNOSIS — J019 Acute sinusitis, unspecified: Secondary | ICD-10-CM | POA: Diagnosis not present

## 2023-07-26 NOTE — Telephone Encounter (Signed)
Patient contacted office to request prep to be sent to [facility].  Informed him that his prep was sent over on 02/17/23.  I've called his Suprep rx into [facility].  Also asked him to stop his Metformin on 07/30/23 2 days prior to his colonoscopy.  As the anesthesia guidelines had not changed when he was scheduled in Feb 2024.  Thanks, [place], [place]

## 2023-07-27 ENCOUNTER — Telehealth: Payer: Self-pay

## 2023-07-27 ENCOUNTER — Other Ambulatory Visit: Payer: Self-pay | Admitting: Internal Medicine

## 2023-07-27 DIAGNOSIS — G4733 Obstructive sleep apnea (adult) (pediatric): Secondary | ICD-10-CM | POA: Diagnosis not present

## 2023-07-27 DIAGNOSIS — E782 Mixed hyperlipidemia: Secondary | ICD-10-CM

## 2023-07-27 DIAGNOSIS — E119 Type 2 diabetes mellitus without complications: Secondary | ICD-10-CM

## 2023-07-27 NOTE — Telephone Encounter (Signed)
Patient called in because wanted to know if he can still have his procedure done on Monday. The patient stated that he has a sinus infection and is currently taking antibiotics.

## 2023-07-28 NOTE — Telephone Encounter (Signed)
Requested Prescriptions  Pending Prescriptions Disp Refills   ezetimibe (ZETIA) 10 MG tablet [Pharmacy Med Name: EZETIMIBE 10 MG TAB] 90 tablet 1    Sig: TAKE 1 TABLET BY MOUTH DAILY     Cardiovascular:  Antilipid - Sterol Transport Inhibitors Failed - 07/27/2023  5:32 PM      Failed - Lipid Panel in normal range within the last 12 months    Cholesterol, Total  Date Value Ref Range Status  01/12/2016 151 100 - 199 mg/dL Final   Cholesterol  Date Value Ref Range Status  04/26/2023 140 <200 mg/dL Final   LDL Cholesterol (Calc)  Date Value Ref Range Status  04/26/2023 75 mg/dL (calc) Final    Comment:    Reference range: <100 . Desirable range <100 mg/dL for primary prevention;   <70 mg/dL for patients with CHD or diabetic patients  with > or = 2 CHD risk factors. Marland Kitchen LDL-C is now calculated using the Martin-Hopkins  calculation, which is a validated novel method providing  better accuracy than the Friedewald equation in the  estimation of LDL-C.  Horald Pollen et al. Lenox Ahr. 6045;409(81): 2061-2068  (http://education.[employer].com/faq/FAQ164)    HDL  Date Value Ref Range Status  04/26/2023 38 (L) > OR = 40 mg/dL Final  19/14/7829 37 (L) >39 mg/dL Final   Triglycerides  Date Value Ref Range Status  04/26/2023 175 (H) <150 mg/dL Final         Passed - AST in normal range and within 360 days    AST  Date Value Ref Range Status  04/26/2023 18 10 - 35 U/L Final         Passed - ALT in normal range and within 360 days    ALT  Date Value Ref Range Status  04/26/2023 24 9 - 46 U/L Final         Passed - Patient is not pregnant      Passed - Valid encounter within last 12 months    Recent Outpatient Visits           3 months ago Type 2 diabetes mellitus without complication, without long-term current use of insulin [facility])   [facility] Margarita Mail, DO   9 months ago Type 2 diabetes mellitus with hyperglycemia, without long-term  current use of insulin [facility])   [place] [facility] Margarita Mail, DO   1 year ago COVID-19   [facility] Margarita Mail, DO   1 year ago Type 2 diabetes mellitus without complication, without long-term current use of insulin [facility])   [place] [facility] Margarita Mail, DO   1 year ago AKI (acute kidney injury) [facility])   [facility] Caro Laroche, DO       Future Appointments             In 3 months Margarita Mail, DO [facility]

## 2023-08-19 ENCOUNTER — Encounter
Admission: RE | Disposition: A | Discharge status: Home / Self Care | Payer: Self-pay | Source: Ambulatory Visit | Attending: Gastroenterology

## 2023-08-19 ENCOUNTER — Ambulatory Visit: Payer: Medicare PPO | Admitting: Anesthesiology

## 2023-08-19 ENCOUNTER — Ambulatory Visit
Admission: RE | Admit: 2023-08-19 | Discharge: 2023-08-19 | Disposition: A | Discharge status: Home / Self Care | Payer: Medicare PPO | Source: Ambulatory Visit | Attending: Gastroenterology | Admitting: Gastroenterology

## 2023-08-19 DIAGNOSIS — Z1211 Encounter for screening for malignant neoplasm of colon: Secondary | ICD-10-CM | POA: Diagnosis not present

## 2023-08-19 DIAGNOSIS — D122 Benign neoplasm of ascending colon: Secondary | ICD-10-CM | POA: Diagnosis not present

## 2023-08-19 DIAGNOSIS — K644 Residual hemorrhoidal skin tags: Secondary | ICD-10-CM | POA: Diagnosis not present

## 2023-08-19 DIAGNOSIS — K5730 Diverticulosis of large intestine without perforation or abscess without bleeding: Secondary | ICD-10-CM | POA: Diagnosis not present

## 2023-08-19 DIAGNOSIS — K635 Polyp of colon: Secondary | ICD-10-CM | POA: Diagnosis not present

## 2023-08-19 DIAGNOSIS — R339 Retention of urine, unspecified: Secondary | ICD-10-CM | POA: Diagnosis not present

## 2023-08-19 DIAGNOSIS — K649 Unspecified hemorrhoids: Secondary | ICD-10-CM | POA: Diagnosis not present

## 2023-08-19 HISTORY — PX: POLYPECTOMY: SHX5525

## 2023-08-19 HISTORY — PX: COLONOSCOPY WITH PROPOFOL: SHX5780

## 2023-08-19 LAB — GLUCOSE, CAPILLARY: Glucose-Capillary: 181 mg/dL — ABNORMAL HIGH (ref 70–99)

## 2023-08-19 SURGERY — COLONOSCOPY WITH PROPOFOL
Anesthesia: General

## 2023-08-19 MED ORDER — LIDOCAINE HCL (CARDIAC) PF 100 MG/5ML IV SOSY
PREFILLED_SYRINGE | INTRAVENOUS | Status: DC | PRN
  Administered 2023-08-19: 50 mg via INTRAVENOUS

## 2023-08-19 MED ORDER — PROPOFOL 10 MG/ML IV BOLUS
INTRAVENOUS | Status: DC | PRN
  Administered 2023-08-19: 80 mg via INTRAVENOUS
  Administered 2023-08-19: 20 mg via INTRAVENOUS
  Administered 2023-08-19: 100 ug/kg/min via INTRAVENOUS

## 2023-08-19 MED ORDER — SODIUM CHLORIDE 0.9 % IV SOLN
INTRAVENOUS | Status: DC
  Administered 2023-08-19: 20 mL/h via INTRAVENOUS

## 2023-08-19 MED ORDER — PROPOFOL 1000 MG/100ML IV EMUL
INTRAVENOUS | Status: AC
  Filled 2023-08-19: qty 100

## 2023-08-19 MED ORDER — STERILE WATER FOR IRRIGATION IR SOLN
Status: DC | PRN
  Administered 2023-08-19: 60 mL

## 2023-08-19 NOTE — Transfer of Care (Signed)
Immediate Anesthesia Transfer of Care Note  Patient: Alan Holt  Procedure(s) Performed: COLONOSCOPY WITH PROPOFOL  Patient Location: PACU  Anesthesia Type:MAC  Level of Consciousness: drowsy  Airway & Oxygen Therapy: Patient Spontanous Breathing  Post-op Assessment: Report given to RN and Post -op Vital signs reviewed and stable  Post vital signs: Reviewed and stable  Last Vitals:  Vitals Value Taken Time  BP 105/65 08/19/23 0808  Temp 35.6 C 08/19/23 0807  Pulse 64 08/19/23 0809  Resp 17 08/19/23 0809  SpO2 99 % 08/19/23 0809  Vitals shown include unfiled device data.  Last Pain:  Vitals:   08/19/23 0807  TempSrc: Temporal  PainSc: Asleep         Complications: No notable events documented.

## 2023-08-19 NOTE — Anesthesia Postprocedure Evaluation (Signed)
Anesthesia Post Note  Patient: Alan Holt  Procedure(s) Performed: COLONOSCOPY WITH PROPOFOL POLYPECTOMY  Patient location during evaluation: Endoscopy Anesthesia Type: General Level of consciousness: awake and alert Pain management: pain level controlled Vital Signs Assessment: post-procedure vital signs reviewed and stable Respiratory status: spontaneous breathing, nonlabored ventilation, respiratory function stable and patient connected to nasal cannula oxygen Cardiovascular status: blood pressure returned to baseline and stable Postop Assessment: no apparent nausea or vomiting Anesthetic complications: no   No notable events documented.   Last Vitals:  Vitals:   08/19/23 0655 08/19/23 0807  BP: 133/84 105/65  Pulse: 82 62  Resp: 20 16  Temp: (!) 36 C (!) 35.6 C  SpO2: 99% 99%    Last Pain:  Vitals:   08/19/23 0827  TempSrc:   PainSc: 0-No pain                 Louie Boston

## 2023-08-19 NOTE — Anesthesia Preprocedure Evaluation (Addendum)
Anesthesia Evaluation  Patient identified by MRN, date of birth, ID band Patient awake    Reviewed: Allergy & Precautions, NPO status , Patient's Chart, lab work & pertinent test results  History of Anesthesia Complications Negative for: history of anesthetic complications  Airway Mallampati: II  TM Distance: >3 FB Neck ROM: full    Dental  (+) Poor Dentition   Pulmonary sleep apnea and Continuous Positive Airway Pressure Ventilation    Pulmonary exam normal        Cardiovascular hypertension, Normal cardiovascular exam     Neuro/Psych negative neurological ROS  negative psych ROS   GI/Hepatic negative GI ROS, Neg liver ROS,,,  Endo/Other  diabetes, Well Controlled, Type 2Hypothyroidism    Renal/GU  Bladder dysfunction      Musculoskeletal   Abdominal   Peds  Hematology negative hematology ROS (+)   Anesthesia Other Findings Past Medical History: No date: Allergy No date: Diabetes mellitus without complication (HCC) 02/25/2014: Dysplastic nevus     Comment:  L mid ant thigh - mild 02/25/2014: Dysplastic nevus     Comment:  R med popliteal - mild 02/25/2014: Dysplastic nevus     Comment:  R med knee - mild 12/30/2016: Dysplastic nevus     Comment:  R pectoral 2.0 cm med to areola - mod 12/30/2016: Dysplastic nevus     Comment:  L dorsum prox forearm near antecubital - mod 11/10/2016: Elevated BP without diagnosis of hypertension No date: Hypertension 05/10/2018: Prediabetes No date: Sleep apnea No date: Thyroid disease  Past Surgical History: No date: HERNIA REPAIR     Comment:  childhood 1993: KNEE SURGERY; Left 04/05/2018: MOUTH SURGERY     Comment:  bone graft, 3 teeth pulled 2015: THYROID SURGERY 07/15/2014: TOTAL THYROIDECTOMY; Bilateral     Comment:  Dr. Elenore Rota.   BMI    Body Mass Index: 28.53 kg/m      Reproductive/Obstetrics negative OB ROS                              Anesthesia Physical Anesthesia Plan  ASA: 2  Anesthesia Plan: General   Post-op Pain Management: Minimal or no pain anticipated   Induction: Intravenous  PONV Risk Score and Plan: Propofol infusion and TIVA  Airway Management Planned: Natural Airway and Nasal Cannula  Additional Equipment:   Intra-op Plan:   Post-operative Plan:   Informed Consent: I have reviewed the patients History and Physical, chart, labs and discussed the procedure including the risks, benefits and alternatives for the proposed anesthesia with the patient or authorized representative who has indicated his/her understanding and acceptance.     Dental Advisory Given  Plan Discussed with: Anesthesiologist, CRNA and Surgeon  Anesthesia Plan Comments: (Patient consented for risks of anesthesia including but not limited to:  - adverse reactions to medications - risk of airway placement if required - damage to eyes, teeth, lips or other oral mucosa - nerve damage due to positioning  - sore throat or hoarseness - Damage to heart, brain, nerves, lungs, other parts of body or loss of life  Patient voiced understanding.)        Anesthesia Quick Evaluation

## 2023-08-19 NOTE — Op Note (Signed)
[facility]astroenterology Patient Name: Alan Holt Procedure Date: 08/19/2023 7:41 AM MRN: [id number] Account #: [id number] Date of Birth: [birth year] Admit Type: Outpatient Age: 72 Room: [facility] ENDO ROOM 1 Gender: Male Note Status: Finalized Instrument Name: Colonoscope [id number] Procedure:             Colonoscopy Indications:           Screening for colorectal malignant neoplasm Providers:             Toney Reil MD, MD Referring MD:          Margarita Mail (Referring MD) Medicines:             General Anesthesia Complications:         No immediate complications. Estimated blood loss: None. Procedure:             Pre-Anesthesia Assessment:                        - Prior to the procedure, a History and Physical was                         performed, and patient medications and allergies were                         reviewed. The patient is competent. The risks and                         benefits of the procedure and the sedation options and                         risks were discussed with the patient. All questions                         were answered and informed consent was obtained.                         Patient identification and proposed procedure were                         verified by the physician, the nurse, the                         anesthesiologist, the anesthetist and the technician                         in the pre-procedure area in the procedure room in the                         endoscopy suite. Mental Status Examination: alert and                         oriented. Airway Examination: normal oropharyngeal                         airway and neck mobility. Respiratory Examination:                         clear to auscultation. CV Examination: normal.  Prophylactic Antibiotics: The patient does not require                         prophylactic antibiotics. Prior Anticoagulants: The                          patient has taken no anticoagulant or antiplatelet                         agents. ASA Grade Assessment: II - A patient with mild                         systemic disease. After reviewing the risks and                         benefits, the patient was deemed in satisfactory                         condition to undergo the procedure. The anesthesia                         plan was to use general anesthesia. Immediately prior                         to administration of medications, the patient was                         re-assessed for adequacy to receive sedatives. The                         heart rate, respiratory rate, oxygen saturations,                         blood pressure, adequacy of pulmonary ventilation, and                         response to care were monitored throughout the                         procedure. The physical status of the patient was                         re-assessed after the procedure.                        After obtaining informed consent, the colonoscope was                         passed under direct vision. Throughout the procedure,                         the patient's blood pressure, pulse, and oxygen                         saturations were monitored continuously. The                         Colonoscope was introduced through the anus and  advanced to the the terminal ileum, with                         identification of the appendiceal orifice and IC                         valve. The colonoscopy was performed without                         difficulty. The patient tolerated the procedure well.                         The quality of the bowel preparation was evaluated                         using the BBPS [facility] Bowel Preparation Scale) with                         scores of: Right Colon = 3, Transverse Colon = 3 and                         Left Colon = 3 (entire mucosa seen well with no                         residual  staining, small fragments of stool or opaque                         liquid). The total BBPS score equals 9. The terminal                         ileum, ileocecal valve, appendiceal orifice, and                         rectum were photographed. Findings:      The perianal and digital rectal examinations were normal. Pertinent       negatives include normal sphincter tone and no palpable rectal lesions.      The terminal ileum appeared normal.      A diminutive polyp was found in the ascending colon. The polyp was       sessile. The polyp was removed with a jumbo cold forceps. Resection and       retrieval were complete.      A few small-mouthed diverticula were found in the recto-sigmoid colon       and sigmoid colon.      Non-bleeding external hemorrhoids were found during retroflexion. The       hemorrhoids were medium-sized. Impression:            - The examined portion of the ileum was normal.                        - One diminutive polyp in the ascending colon, removed                         with a jumbo cold forceps. Resected and retrieved.                        - Diverticulosis in the recto-sigmoid colon  and in the                         sigmoid colon.                        - Non-bleeding external hemorrhoids. Recommendation:        - Discharge patient to home (with escort).                        - Resume previous diet today.                        - Continue present medications.                        - Await pathology results.                        - Repeat colonoscopy in 7-10 years for surveillance                         based on pathology results. Procedure Code(s):     --- Professional ---                        [phone], Colonoscopy, flexible; with biopsy, single or                         multiple Diagnosis Code(s):     --- Professional ---                        Z12.11, Encounter for screening for malignant neoplasm                         of colon                         K64.4, Residual hemorrhoidal skin tags                        D12.2, Benign neoplasm of ascending colon                        K57.30, Diverticulosis of large intestine without                         perforation or abscess without bleeding CPT copyright 2022 American Medical Association. All rights reserved. The codes documented in this report are preliminary and upon coder review may  be revised to meet current compliance requirements. Dr. Libby Maw Toney Reil MD, MD 08/19/2023 8:05:56 AM This report has been signed electronically. Number of Addenda: 0 Note Initiated On: 08/19/2023 7:41 AM Scope Withdrawal Time: 0 hours 12 minutes 2 seconds  Total Procedure Duration: 0 hours 14 minutes 16 seconds  Estimated Blood Loss:  Estimated blood loss: none.      [facility]

## 2023-08-19 NOTE — H&P (Signed)
Arlyss Repress, MD [address]  [address]  [place], [place] [address]  Main: [phone]  Fax: [phone] Pager: [phone]  Primary Care Physician:  Margarita Mail, DO Primary Gastroenterologist:  Dr. Arlyss Repress  Pre-Procedure History & Physical: HPI:  Alan Holt is a 72 y.o. male is here for an colonoscopy.   Past Medical History:  Diagnosis Date   Allergy    Diabetes mellitus without complication (HCC)    Dysplastic nevus 02/25/2014   L mid ant thigh - mild   Dysplastic nevus 02/25/2014   R med popliteal - mild   Dysplastic nevus 02/25/2014   R med knee - mild   Dysplastic nevus 12/30/2016   R pectoral 2.0 cm med to areola - mod   Dysplastic nevus 12/30/2016   L dorsum prox forearm near antecubital - mod   Elevated BP without diagnosis of hypertension 11/10/2016   Hypertension    Prediabetes 05/10/2018   Sleep apnea    Thyroid disease     Past Surgical History:  Procedure Laterality Date   HERNIA REPAIR     childhood   KNEE SURGERY Left 1993   MOUTH SURGERY  04/05/2018   bone graft, 3 teeth pulled   THYROID SURGERY  2015   TOTAL THYROIDECTOMY Bilateral 07/15/2014   Dr. Elenore Rota.     Prior to Admission medications   Medication Sig Start Date End Date Taking? Authorizing Provider  Accu-Chek Softclix Lancets lancets USE AS DIRECTED TO MONITOR FASTING BLOODGLUCOSE ONCE DAILY AS NEEDED 12/07/22  Yes Margarita Mail, DO  acetaminophen (TYLENOL) 325 MG tablet Take 2 tablets (650 mg total) by mouth every 6 (six) hours as needed for moderate pain, mild pain or fever. 03/04/22  Yes Hongalgi, Maximino Greenland, MD  Blood Glucose Monitoring Suppl (ACCU-CHEK GUIDE) w/Device KIT See admin instructions. 02/06/21  Yes [provider]  ezetimibe (ZETIA) 10 MG tablet TAKE 1 TABLET BY MOUTH DAILY 07/28/23  Yes Margarita Mail, DO  finasteride (PROSCAR) 5 MG tablet Take 1 tablet (5 mg total) by mouth daily. 03/05/22  Yes Hongalgi, Anand D, MD  glucose blood  (ACCU-CHEK GUIDE) test strip USE AS DIRECTED TO MONITOR FASTING BLOODGLUCOSE ONCE DAILY AS NEEDED 12/07/22  Yes Margarita Mail, DO  levothyroxine (SYNTHROID, LEVOTHROID) 150 MCG tablet Take 1 tablet by mouth daily. 09/20/17  Yes [provider]  metFORMIN (GLUCOPHAGE) 500 MG tablet Take 1 tablet (500 mg total) by mouth daily with breakfast. 04/28/23  Yes Margarita Mail, DO  tamsulosin (FLOMAX) 0.4 MG CAPS capsule Take 1 capsule (0.4 mg total) by mouth daily. 03/05/22  Yes Hongalgi, Maximino Greenland, MD  ciprofloxacin (CIPRO) 500 MG tablet Take 1 tablet (500 mg total) by mouth 2 (two) times daily. 11/30/22   Jene Every, MD    Allergies as of 02/17/2023 - Review Complete 02/17/2023  Allergen Reaction Noted   Statins  10/27/2021   Erythromycin Rash 07/09/2015    Family History  Problem Relation Age of Onset   Alcohol abuse Father    Stroke Father    Diabetes Mother    Diabetes Brother    Prostate cancer Brother    Heart attack Brother     Social History   Socioeconomic History   Marital status: Married    Spouse name: Not on file   Number of children: Not on file   Years of education: Not on file   Highest education level: Master's degree (e.g., MA, MS, MEng, MEd, MSW, MBA)  Occupational History  Comment: retired Optometrist  Tobacco Use   Smoking status: Never   Smokeless tobacco: Never  Vaping Use   Vaping status: Never Used  Substance and Sexual Activity   Alcohol use: No    Alcohol/week: 0.0 standard drinks of alcohol   Drug use: No   Sexual activity: Not Currently  Other Topics Concern   Not on file  Social History Narrative   Not on file   Social Determinants of Health   Financial Resource Strain: Low Risk  (04/25/2023)   Overall Financial Resource Strain (CARDIA)    Difficulty of Paying Living Expenses: Not hard at all  Food Insecurity: No Food Insecurity (04/25/2023)   Hunger Vital Sign    Worried About Running Out of Food in the Last Year: Never true     Ran Out of Food in the Last Year: Never true  Transportation Needs: No Transportation Needs (04/25/2023)   PRAPARE - Administrator, Civil Service (Medical): No    Lack of Transportation (Non-Medical): No  Physical Activity: Sufficiently Active (04/25/2023)   Exercise Vital Sign    Days of Exercise per Week: 5 days    Minutes of Exercise per Session: 150+ min  Stress: No Stress Concern Present (04/25/2023)   [employer] of Occupational Health - Occupational Stress Questionnaire    Feeling of Stress : Not at all  Social Connections: Socially Integrated (04/25/2023)   Social Connection and Isolation Panel [NHANES]    Frequency of Communication with Friends and Family: More than three times a week    Frequency of Social Gatherings with Friends and Family: Once a week    Attends Religious Services: More than 4 times per year    Active Member of [employer] or Organizations: Yes    Attends Engineer, structural: More than 4 times per year    Marital Status: Married  Catering manager Violence: Not At Risk (02/17/2023)   Humiliation, Afraid, Rape, and Kick questionnaire    Fear of Current or Ex-Partner: No    Emotionally Abused: No    Physically Abused: No    Sexually Abused: No    Review of Systems: See HPI, otherwise negative ROS  Physical Exam: BP 133/84   Pulse 82   Temp (!) 96.8 F (36 C) (Temporal)   Resp 20   Ht 6' 1.5" (1.867 m)   Wt 99.4 kg   SpO2 99%   BMI 28.53 kg/m  General:   Alert,  pleasant and cooperative in NAD Head:  Normocephalic and atraumatic. Neck:  Supple; no masses or thyromegaly. Lungs:  Clear throughout to auscultation.    Heart:  Regular rate and rhythm. Abdomen:  Soft, nontender and nondistended. Normal bowel sounds, without guarding, and without rebound.   Neurologic:  Alert and  oriented x4;  grossly normal neurologically.  Impression/Plan: Alan Holt is here for an colonoscopy to be performed for colon cancer  screening  Risks, benefits, limitations, and alternatives regarding  colonoscopy have been reviewed with the patient.  Questions have been answered.  All parties agreeable.   Lannette Donath, MD  08/19/2023, 7:34 AM

## 2023-08-22 ENCOUNTER — Encounter: Payer: Self-pay | Admitting: Gastroenterology

## 2023-08-23 ENCOUNTER — Encounter: Payer: Self-pay | Admitting: Gastroenterology

## 2023-09-02 DIAGNOSIS — R350 Frequency of micturition: Secondary | ICD-10-CM | POA: Diagnosis not present

## 2023-09-02 DIAGNOSIS — Z789 Other specified health status: Secondary | ICD-10-CM | POA: Diagnosis not present

## 2023-09-16 DIAGNOSIS — E890 Postprocedural hypothyroidism: Secondary | ICD-10-CM | POA: Diagnosis not present

## 2023-09-19 DIAGNOSIS — G4733 Obstructive sleep apnea (adult) (pediatric): Secondary | ICD-10-CM | POA: Diagnosis not present

## 2023-09-19 DIAGNOSIS — E890 Postprocedural hypothyroidism: Secondary | ICD-10-CM | POA: Diagnosis not present

## 2023-09-26 DIAGNOSIS — N200 Calculus of kidney: Secondary | ICD-10-CM | POA: Diagnosis not present

## 2023-09-26 DIAGNOSIS — R338 Other retention of urine: Secondary | ICD-10-CM | POA: Diagnosis not present

## 2023-09-26 DIAGNOSIS — R339 Retention of urine, unspecified: Secondary | ICD-10-CM | POA: Diagnosis not present

## 2023-10-04 DIAGNOSIS — R339 Retention of urine, unspecified: Secondary | ICD-10-CM | POA: Diagnosis not present

## 2023-10-05 DIAGNOSIS — E119 Type 2 diabetes mellitus without complications: Secondary | ICD-10-CM | POA: Diagnosis not present

## 2023-10-05 DIAGNOSIS — H2513 Age-related nuclear cataract, bilateral: Secondary | ICD-10-CM | POA: Diagnosis not present

## 2023-10-05 DIAGNOSIS — H5203 Hypermetropia, bilateral: Secondary | ICD-10-CM | POA: Diagnosis not present

## 2023-10-05 LAB — HM DIABETES EYE EXAM

## 2023-10-24 NOTE — Progress Notes (Unsigned)
Established Patient Office Visit  Subjective   Patient ID: ADMIR CAPUCHINO, male    DOB: [birth year]  Age: 72 y.o. MRN: [id number]  No chief complaint on file.   HPI  Patient is here to follow-up on chronic medical conditions.   OSA: -Compliant with CPAP, doing well   HLD: -Medications: Zetia 10 mg, history of rhabdomyolysis on statin last fall.   -Patient denies side effects and is compliant with medication on a daily basis. -Last lipid panel: Lipid Panel     Component Value Date/Time   CHOL 140 04/26/2023 0927   CHOL 151 01/12/2016 0853   TRIG 175 (H) 04/26/2023 0927   HDL 38 (L) 04/26/2023 0927   HDL 37 (L) 01/12/2016 0853   CHOLHDL 3.7 04/26/2023 0927   VLDL 27 05/03/2017 0906   LDLCALC 75 04/26/2023 0927   LABVLDL 36 01/12/2016 0853   The 10-year ASCVD risk score (Arnett DK, et al., 2019) is: 38.8%   Values used to calculate the score:     Age: 48 years     Sex: Male     Is Non-Hispanic African American: No     Diabetic: Yes     Tobacco smoker: No     Systolic Blood Pressure: 133 mmHg     Is BP treated: No     HDL Cholesterol: 38 mg/dL     Total Cholesterol: 140 mg/dL  Diabetes, Type 2: -Last A1c 7.2% 4/24 -Medications: Started on Metformin 500 mg at LOV -Eye exam: UTD, [facility]  -Foot exam: UTD 4/24 -Microalbumin: Due -Statin: No, history of intolerance  -PNA vaccine: UTD -Denies symptoms of hypoglycemia, polyuria, polydipsia, numbness extremities, foot ulcers/trauma.  -Exercise: walks 10 miles a week  Hypothyroidism: -Medications: Levothyroxine 150 mcg -S/p thyroidectomy in 2015, enlarged with calcifications  -Patient is compliant with the above medication (s) at the above dose and reports no medication side effects.  -Denies weight changes, cold./heat intolerance, skin changes, anxiety/palpitations  -Last TSH: Follows with endocrinology, was last evaluated last month where his TSH was normal per the patient.  I am unable to review these results.   He reports no other changes.  BPH/Bladder Outlet obstruction/hypotonic bladder: -Following with Urology at [facility], last n telemedicine note reviewed from 09/15/2022.  He is doing self-catheterization.  Denies any dysuria, hematuria or general discomfort. -Currently on Flomax 0.4 mg and finasteride 5 mg and a few supplements which include cranberry to prevent UTI's  Health maintenance: -Blood work due  Review of Systems  Constitutional:  Negative for chills and fever.  Respiratory:  Negative for shortness of breath.   Cardiovascular:  Negative for chest pain.  Genitourinary:  Negative for dysuria, flank pain, frequency, hematuria and urgency.  Musculoskeletal:  Negative for myalgias.  Neurological:  Negative for dizziness and tingling.      Objective:     There were no vitals taken for this visit. BP Readings from Last 3 Encounters:  08/19/23 105/65  04/26/23 134/80  11/30/22 120/80   Wt Readings from Last 3 Encounters:  08/19/23 219 lb 3.2 oz (99.4 kg)  04/26/23 220 lb 9.6 oz (100.1 kg)  02/17/23 225 lb (102.1 kg)      Physical Exam Constitutional:      Appearance: Normal appearance.  HENT:     Head: Normocephalic and atraumatic.  Eyes:     Conjunctiva/sclera: Conjunctivae normal.  Cardiovascular:     Rate and Rhythm: Normal rate and regular rhythm.     Pulses:  Dorsalis pedis pulses are 2+ on the right side and 2+ on the left side.  Pulmonary:     Effort: Pulmonary effort is normal.     Breath sounds: Normal breath sounds.  Musculoskeletal:     Right lower leg: No edema.     Left lower leg: No edema.     Right foot: Normal range of motion. No deformity, bunion, Charcot foot, foot drop or prominent metatarsal heads.     Left foot: Normal range of motion. No deformity, bunion, Charcot foot, foot drop or prominent metatarsal heads.  Feet:     Right foot:     Protective Sensation: 6 sites tested.  6 sites sensed.     Skin integrity: Skin integrity normal.      Toenail Condition: Right toenails are normal.     Left foot:     Protective Sensation: 6 sites tested.  6 sites sensed.     Skin integrity: Skin integrity normal.     Toenail Condition: Left toenails are normal.  Skin:    General: Skin is warm and dry.  Neurological:     General: No focal deficit present.     Mental Status: He is alert. Mental status is at baseline.  Psychiatric:        Mood and Affect: Mood normal.        Behavior: Behavior normal.      No results found for any visits on 10/26/23.   Last CBC Lab Results  Component Value Date   WBC 5.4 04/26/2023   HGB 16.0 04/26/2023   HCT 47.2 04/26/2023   MCV 88.6 04/26/2023   MCH 30.0 04/26/2023   RDW 12.7 04/26/2023   PLT 222 04/26/2023   Last metabolic panel Lab Results  Component Value Date   GLUCOSE 129 (H) 04/26/2023   NA 139 04/26/2023   K 4.2 04/26/2023   CL 104 04/26/2023   CO2 28 04/26/2023   BUN 19 04/26/2023   CREATININE 0.91 04/26/2023   GFRNONAA >60 11/30/2022   CALCIUM 9.6 04/26/2023   PROT 6.8 04/26/2023   ALBUMIN 4.4 11/30/2022   LABGLOB 2.2 01/12/2016   AGRATIO 2.0 01/12/2016   BILITOT 0.8 04/26/2023   ALKPHOS 70 11/30/2022   AST 18 04/26/2023   ALT 24 04/26/2023   ANIONGAP 8 11/30/2022   Last lipids Lab Results  Component Value Date   CHOL 140 04/26/2023   HDL 38 (L) 04/26/2023   LDLCALC 75 04/26/2023   TRIG 175 (H) 04/26/2023   CHOLHDL 3.7 04/26/2023   Last hemoglobin A1c Lab Results  Component Value Date   HGBA1C 7.2 (H) 04/26/2023   Last thyroid functions No results found for: "TSH", "T3TOTAL", "T4TOTAL", "THYROIDAB" Last vitamin D Lab Results  Component Value Date   VD25OH 51 02/01/2018   Last vitamin B12 and Folate No results found for: "VITAMINB12", "FOLATE"    The 10-year ASCVD risk score (Arnett DK, et al., 2019) is: 38.8%    Assessment & Plan:   1. Type 2 diabetes mellitus without complication, without long-term current use of insulin (HCC): Labs due.  Check A1c today. Foot exam today. Patient not currently on medication for diabetes, diet controlled.  - CBC w/Diff/Platelet - COMPLETE METABOLIC PANEL WITH GFR - HgB Z6X - HM Diabetes Foot Exam  2. OSA (obstructive sleep apnea): Stable, doing well on CPAP.  3. Mixed hyperlipidemia: Check lipid panel today, continue Zetia 10 mg daily, refilled. History of statin intolerance.   - Lipid Profile - ezetimibe (ZETIA)  10 MG tablet; Take 1 tablet (10 mg total) by mouth daily.  Dispense: 90 tablet; Refill: 1  4. Hypothyroidism, unspecified type: Stable, following with Endocrinology. Returning in May to recheck labs. Currently on Levothyroxine 150 mcg.   5. Bladder outlet obstruction: Following with Urology at [facility], currently self-catheterizing. Had 3 UTI's in the last 6 months but no symptoms currently. On Flomax 0.4 mg daily and finasteride 5 mg daily.  6. Prostate cancer screening: Check PSA today with above labs. - PSA   No follow-ups on file.    Margarita Mail, DO

## 2023-10-25 ENCOUNTER — Other Ambulatory Visit: Payer: Self-pay | Admitting: Internal Medicine

## 2023-10-25 DIAGNOSIS — E119 Type 2 diabetes mellitus without complications: Secondary | ICD-10-CM

## 2023-10-26 ENCOUNTER — Encounter: Payer: Self-pay | Admitting: Internal Medicine

## 2023-10-26 ENCOUNTER — Ambulatory Visit: Payer: Medicare PPO | Admitting: Internal Medicine

## 2023-10-26 VITALS — BP 134/76 | HR 71 | Temp 97.6°F | Resp 18 | Ht 74.0 in | Wt 225.0 lb

## 2023-10-26 DIAGNOSIS — E119 Type 2 diabetes mellitus without complications: Secondary | ICD-10-CM | POA: Diagnosis not present

## 2023-10-26 DIAGNOSIS — E782 Mixed hyperlipidemia: Secondary | ICD-10-CM

## 2023-10-26 DIAGNOSIS — N312 Flaccid neuropathic bladder, not elsewhere classified: Secondary | ICD-10-CM | POA: Diagnosis not present

## 2023-10-26 DIAGNOSIS — E039 Hypothyroidism, unspecified: Secondary | ICD-10-CM

## 2023-10-26 DIAGNOSIS — Z7984 Long term (current) use of oral hypoglycemic drugs: Secondary | ICD-10-CM | POA: Diagnosis not present

## 2023-10-26 LAB — POCT GLYCOSYLATED HEMOGLOBIN (HGB A1C): Hemoglobin A1C: 7.4 % — AB (ref 4.0–5.6)

## 2023-10-26 MED ORDER — METFORMIN HCL 500 MG PO TABS
500.0000 mg | ORAL_TABLET | Freq: Two times a day (BID) | ORAL | 1 refills | Status: DC
Start: 2023-10-26 — End: 2024-05-02

## 2023-10-26 NOTE — Telephone Encounter (Signed)
Refused metformin 500 mg because it was sent in by Dr. Caralee Ates today and received at the pharmacy. Pt. Seen today for office visit.   Labs are in date.  Requested Prescriptions  Pending Prescriptions Disp Refills   metFORMIN (GLUCOPHAGE) 500 MG tablet [Pharmacy Med Name: METFORMIN HCL 500 MG TAB] 90 tablet 1    Sig: TAKE ONE TABLET BY MOUTH EVERY DAY WITH BREAKFAST     Endocrinology:  Diabetes - Biguanides Failed - 10/25/2023  4:09 PM      Failed - HBA1C is between 0 and 7.9 and within 180 days    Hemoglobin A1C  Date Value Ref Range Status  10/26/2023 7.4 (A) 4.0 - 5.6 % Final   Hgb A1c MFr Bld  Date Value Ref Range Status  04/26/2023 7.2 (H) <5.7 % of total Hgb Final    Comment:    For someone without known diabetes, a hemoglobin A1c value of 6.5% or greater indicates that they may have  diabetes and this should be confirmed with a follow-up  test. . For someone with known diabetes, a value <7% indicates  that their diabetes is well controlled and a value  greater than or equal to 7% indicates suboptimal  control. A1c targets should be individualized based on  duration of diabetes, age, comorbid conditions, and  other considerations. . Currently, no consensus exists regarding use of hemoglobin A1c for diagnosis of diabetes for children. .          Failed - B12 Level in normal range and within 720 days    No results found for: "VITAMINB12"       Failed - Valid encounter within last 6 months    Recent Outpatient Visits           Today Type 2 diabetes mellitus without complication, without long-term current use of insulin [facility])   [place] [facility] Margarita Mail, DO   6 months ago Type 2 diabetes mellitus without complication, without long-term current use of insulin [facility])   [place] [facility] Margarita Mail, DO   1 year ago Type 2 diabetes mellitus with hyperglycemia, without long-term current use of insulin [facility])    [place] [facility] Margarita Mail, DO   1 year ago COVID-19   [facility] Margarita Mail, DO   1 year ago Type 2 diabetes mellitus without complication, without long-term current use of insulin [facility])   [place] [facility] Margarita Mail, DO       Future Appointments             In 3 months Margarita Mail, DO [facility]            Passed - Cr in normal range and within 360 days    Creat  Date Value Ref Range Status  04/26/2023 0.91 0.70 - 1.28 mg/dL Final   Creatinine, Urine  Date Value Ref Range Status  10/25/2022 124 20 - 320 mg/dL Final         Passed - eGFR in normal range and within 360 days    GFR, Est African American  Date Value Ref Range Status  04/22/2021 91 > OR = 60 mL/min/1.97m2 Final   GFR, Est Non African American  Date Value Ref Range Status  04/22/2021 79 > OR = 60 mL/min/1.35m2 Final   GFR, Estimated  Date Value Ref Range Status  11/30/2022 >60 >60 mL/min Final    Comment:    (  NOTE) Calculated using the CKD-EPI Creatinine Equation (2021)    eGFR  Date Value Ref Range Status  04/26/2023 90 > OR = 60 mL/min/1.79m2 Final         Passed - CBC within normal limits and completed in the last 12 months    WBC  Date Value Ref Range Status  04/26/2023 5.4 3.8 - 10.8 Thousand/uL Final   RBC  Date Value Ref Range Status  04/26/2023 5.33 4.20 - 5.80 Million/uL Final   Hemoglobin  Date Value Ref Range Status  04/26/2023 16.0 13.2 - 17.1 g/dL Final  78/29/5621 30.8 12.6 - 17.7 g/dL Final   HCT  Date Value Ref Range Status  04/26/2023 47.2 38.5 - 50.0 % Final   Hematocrit  Date Value Ref Range Status  06/18/2016 45.3 37.5 - 51.0 % Final   MCHC  Date Value Ref Range Status  04/26/2023 33.9 32.0 - 36.0 g/dL Final   [facility]  Date Value Ref Range Status  04/26/2023 30.0 27.0 - 33.0 pg Final   MCV  Date Value Ref Range Status   04/26/2023 88.6 80.0 - 100.0 fL Final  06/18/2016 90 79 - 97 fL Final  11/08/2014 92 80 - 100 fL Final   No results found for: "PLTCOUNTKUC", "LABPLAT", "POCPLA" RDW  Date Value Ref Range Status  04/26/2023 12.7 11.0 - 15.0 % Final  06/18/2016 13.5 12.3 - 15.4 % Final  11/08/2014 13.6 11.5 - 14.5 % Final

## 2023-10-26 NOTE — Patient Instructions (Addendum)
It was great seeing you today!  Plan discussed at today's visit: -A1c 7.4% today - increase Metformin to 500 mg twice a day and work on decreasing sugar and carbs in the diet   Diabetes Mellitus and Nutrition, Adult When you have diabetes, or diabetes mellitus, it is very important to have healthy eating habits because your blood sugar (glucose) levels are greatly affected by what you eat and drink. Eating healthy foods in the right amounts, at about the same times every day, can help you: Manage your blood glucose. Lower your risk of heart disease. Improve your blood pressure. Reach or maintain a healthy weight. What can affect my meal plan? Every person with diabetes is different, and each person has different needs for a meal plan. Your health care provider may recommend that you work with a dietitian to make a meal plan that is best for you. Your meal plan may vary depending on factors such as: The calories you need. The medicines you take. Your weight. Your blood glucose, blood pressure, and cholesterol levels. Your activity level. Other health conditions you have, such as heart or kidney disease. How do carbohydrates affect me? Carbohydrates, also called carbs, affect your blood glucose level more than any other type of food. Eating carbs raises the amount of glucose in your blood. It is important to know how many carbs you can safely have in each meal. This is different for every person. Your dietitian can help you calculate how many carbs you should have at each meal and for each snack. How does alcohol affect me? Alcohol can cause a decrease in blood glucose (hypoglycemia), especially if you use insulin or take certain diabetes medicines by mouth. Hypoglycemia can be a life-threatening condition. Symptoms of hypoglycemia, such as sleepiness, dizziness, and confusion, are similar to symptoms of having too much alcohol. Do not drink alcohol if: Your health care provider tells you not  to drink. You are pregnant, may be pregnant, or are planning to become pregnant. If you drink alcohol: Limit how much you have to: 0-1 drink a day for women. 0-2 drinks a day for men. Know how much alcohol is in your drink. In the [place] one drink equals one 12 oz bottle of beer (355 mL), one 5 oz glass of wine (148 mL), or one 1 oz glass of hard liquor (44 mL). Keep yourself hydrated with water, diet soda, or unsweetened iced tea. Keep in mind that regular soda, juice, and other mixers may contain a lot of sugar and must be counted as carbs. What are tips for following this plan?  Reading food labels Start by checking the serving size on the Nutrition Facts label of packaged foods and drinks. The number of calories and the amount of carbs, fats, and other nutrients listed on the label are based on one serving of the item. Many items contain more than one serving per package. Check the total grams (g) of carbs in one serving. Check the number of grams of saturated fats and trans fats in one serving. Choose foods that have a low amount or none of these fats. Check the number of milligrams (mg) of salt (sodium) in one serving. Most people should limit total sodium intake to less than 2,300 mg per day. Always check the nutrition information of foods labeled as "low-fat" or "nonfat." These foods may be higher in added sugar or refined carbs and should be avoided. Talk to your dietitian to identify your daily goals for nutrients listed  on the label. Shopping Avoid buying canned, pre-made, or processed foods. These foods tend to be high in fat, sodium, and added sugar. Shop around the outside edge of the grocery store. This is where you will most often find fresh fruits and vegetables, bulk grains, fresh meats, and fresh dairy products. Cooking Use low-heat cooking methods, such as baking, instead of high-heat cooking methods, such as deep frying. Cook using healthy oils, such as olive, canola, or  sunflower oil. Avoid cooking with butter, cream, or high-fat meats. Meal planning Eat meals and snacks regularly, preferably at the same times every day. Avoid going long periods of time without eating. Eat foods that are high in fiber, such as fresh fruits, vegetables, beans, and whole grains. Eat 4-6 oz (112-168 g) of lean protein each day, such as lean meat, chicken, fish, eggs, or tofu. One ounce (oz) (28 g) of lean protein is equal to: 1 oz (28 g) of meat, chicken, or fish. 1 egg.  cup (62 g) of tofu. Eat some foods each day that contain healthy fats, such as avocado, nuts, seeds, and fish. What foods should I eat? Fruits Berries. Apples. Oranges. Peaches. Apricots. Plums. Grapes. Mangoes. Papayas. Pomegranates. Kiwi. Cherries. Vegetables Leafy greens, including lettuce, spinach, kale, chard, collard greens, mustard greens, and cabbage. Beets. Cauliflower. Broccoli. Carrots. Green beans. Tomatoes. Peppers. Onions. Cucumbers. Brussels sprouts. Grains Whole grains, such as whole-wheat or whole-grain bread, crackers, tortillas, cereal, and pasta. Unsweetened oatmeal. Quinoa. Brown or wild rice. Meats and other proteins Seafood. Poultry without skin. Lean cuts of poultry and beef. Tofu. Nuts. Seeds. Dairy Low-fat or fat-free dairy products such as milk, yogurt, and cheese. The items listed above may not be a complete list of foods and beverages you can eat and drink. Contact a dietitian for more information. What foods should I avoid? Fruits Fruits canned with syrup. Vegetables Canned vegetables. Frozen vegetables with butter or cream sauce. Grains Refined white flour and flour products such as bread, pasta, snack foods, and cereals. Avoid all processed foods. Meats and other proteins Fatty cuts of meat. Poultry with skin. Breaded or fried meats. Processed meat. Avoid saturated fats. Dairy Full-fat yogurt, cheese, or milk. Beverages Sweetened drinks, such as soda or iced tea. The  items listed above may not be a complete list of foods and beverages you should avoid. Contact a dietitian for more information. Questions to ask a health care provider Do I need to meet with a certified diabetes care and education specialist? Do I need to meet with a dietitian? What number can I call if I have questions? When are the best times to check my blood glucose? Where to find more information: American Diabetes Association: diabetes.org Academy of Nutrition and Dietetics: eatright.[employer] of Diabetes and Digestive and Kidney Diseases: StageSync.si Association of Diabetes Care & Education Specialists: diabeteseducator.org Summary It is important to have healthy eating habits because your blood sugar (glucose) levels are greatly affected by what you eat and drink. It is important to use alcohol carefully. A healthy meal plan will help you manage your blood glucose and lower your risk of heart disease. Your health care provider may recommend that you work with a dietitian to make a meal plan that is best for you. This information is not intended to replace advice given to you by your health care provider. Make sure you discuss any questions you have with your health care provider. Document Revised: 07/16/2020 Document Reviewed: 07/16/2020 Elsevier Patient Education  2024 [employer].  Follow up in: 3 months   Take care and let us know if you have any questions or concerns prior to your next visit.  Dr. Caralee Ates

## 2023-10-27 LAB — MICROALBUMIN / CREATININE URINE RATIO
Creatinine, Urine: 124 mg/dL (ref 20–320)
Microalb Creat Ratio: 25 mg/g{creat}
Microalb, Ur: 3.1 mg/dL

## 2023-11-03 ENCOUNTER — Other Ambulatory Visit: Payer: Self-pay | Admitting: Internal Medicine

## 2023-11-03 DIAGNOSIS — E119 Type 2 diabetes mellitus without complications: Secondary | ICD-10-CM

## 2023-11-03 DIAGNOSIS — E1165 Type 2 diabetes mellitus with hyperglycemia: Secondary | ICD-10-CM

## 2023-11-03 NOTE — Telephone Encounter (Signed)
Requested Prescriptions  Pending Prescriptions Disp Refills   glucose blood (ACCU-CHEK GUIDE) test strip [Pharmacy Med Name: ACCU-CHEK GUIDE STRIP] 100 each 3    Sig: USE AS DIRECTED TO MONITOR FASTING BLOODGLUCOSE ONCE DAILY AS NEEDED     Endocrinology: Diabetes - Testing Supplies Passed - 11/03/2023  9:40 AM      Passed - Valid encounter within last 12 months    Recent Outpatient Visits           1 week ago Type 2 diabetes mellitus without complication, without long-term current use of insulin [facility])   [place] [facility] Margarita Mail, DO   6 months ago Type 2 diabetes mellitus without complication, without long-term current use of insulin [facility])   [place] [facility] Margarita Mail, DO   1 year ago Type 2 diabetes mellitus with hyperglycemia, without long-term current use of insulin [facility])   [place] [facility] Margarita Mail, DO   1 year ago COVID-19   [facility] Margarita Mail, DO   1 year ago Type 2 diabetes mellitus without complication, without long-term current use of insulin [facility])   [place] [facility] Margarita Mail, DO       Future Appointments             In 2 months Margarita Mail, DO [facility], [facility]

## 2023-11-11 DIAGNOSIS — R339 Retention of urine, unspecified: Secondary | ICD-10-CM | POA: Diagnosis not present

## 2023-12-12 DIAGNOSIS — R339 Retention of urine, unspecified: Secondary | ICD-10-CM | POA: Diagnosis not present

## 2023-12-23 ENCOUNTER — Encounter: Payer: Self-pay | Admitting: Pharmacist

## 2023-12-23 NOTE — Progress Notes (Signed)
   12/23/2023  Patient ID: Alan Holt, male   DOB: 25-Mar-1951, 72 y.o.   MRN: [id number]  Pharmacy Quality Measure Review  This patient is appearing on report for being at risk of failing the measure for Statin Use in Persons with Diabetes (SUPD) medications this calendar year.   Per review of chart, note PCP documented on 10/26/2023: "history of rhabdomyolysis on statin in 2023"  No further appointments scheduled with PCP office for remainder of 2024 calendar year. Next appointment with PCP on 01/26/2024.  Will send message to PCP prior to upcoming appointment to request provider review patient's statin history/document SUPD exclusion code if appropriate at this upcoming visit.  Estelle Grumbles, PharmD, [facility] [phone]

## 2024-01-03 DIAGNOSIS — R339 Retention of urine, unspecified: Secondary | ICD-10-CM | POA: Diagnosis not present

## 2024-01-26 ENCOUNTER — Encounter: Payer: Self-pay | Admitting: Internal Medicine

## 2024-01-26 ENCOUNTER — Ambulatory Visit: Payer: Medicare PPO | Admitting: Internal Medicine

## 2024-01-26 ENCOUNTER — Other Ambulatory Visit: Payer: Self-pay

## 2024-01-26 VITALS — BP 130/82 | HR 74 | Temp 98.0°F | Resp 16 | Ht 74.0 in | Wt 220.5 lb

## 2024-01-26 DIAGNOSIS — E1169 Type 2 diabetes mellitus with other specified complication: Secondary | ICD-10-CM | POA: Diagnosis not present

## 2024-01-26 DIAGNOSIS — E782 Mixed hyperlipidemia: Secondary | ICD-10-CM | POA: Diagnosis not present

## 2024-01-26 DIAGNOSIS — Z7984 Long term (current) use of oral hypoglycemic drugs: Secondary | ICD-10-CM

## 2024-01-26 DIAGNOSIS — E119 Type 2 diabetes mellitus without complications: Secondary | ICD-10-CM

## 2024-01-26 DIAGNOSIS — E039 Hypothyroidism, unspecified: Secondary | ICD-10-CM

## 2024-01-26 DIAGNOSIS — N320 Bladder-neck obstruction: Secondary | ICD-10-CM

## 2024-01-26 DIAGNOSIS — Z8739 Personal history of other diseases of the musculoskeletal system and connective tissue: Secondary | ICD-10-CM | POA: Diagnosis not present

## 2024-01-26 DIAGNOSIS — G4733 Obstructive sleep apnea (adult) (pediatric): Secondary | ICD-10-CM | POA: Diagnosis not present

## 2024-01-26 LAB — POCT GLYCOSYLATED HEMOGLOBIN (HGB A1C): Hemoglobin A1C: 6.9 % — AB (ref 4.0–5.6)

## 2024-01-26 MED ORDER — EZETIMIBE 10 MG PO TABS
10.0000 mg | ORAL_TABLET | Freq: Every day | ORAL | 1 refills | Status: DC
Start: 2024-01-26 — End: 2024-08-14

## 2024-01-26 NOTE — Assessment & Plan Note (Signed)
Stable, history of rhabdomyolysis on statin but now controlled on Zetia. Will continue Zetia and plan to recheck fasting labs at follow up.

## 2024-01-26 NOTE — Assessment & Plan Note (Signed)
A1c improved at 6.9%. Continue Metformin 500 mg BID.

## 2024-01-26 NOTE — Assessment & Plan Note (Signed)
Following with Endocrinology at [facility], no changes made to medications.

## 2024-01-26 NOTE — Assessment & Plan Note (Signed)
Stable, compliant with CPAP

## 2024-01-26 NOTE — Assessment & Plan Note (Signed)
Stable, following with Urology. Still do self-catheterization but no infections for a year now. Doing well on Flomax and Finasteride.

## 2024-01-26 NOTE — Assessment & Plan Note (Signed)
Off statin, controlled on Zetia.

## 2024-01-26 NOTE — Progress Notes (Signed)
Established Patient Office Visit  Subjective   Patient ID: Alan Holt, male    DOB: Jun 13, 1951  Age: 73 y.o. MRN: [id number]  Chief Complaint  Patient presents with   Medical Management of Chronic Issues    6 month recheck    HPI   Patient is here to follow-up on chronic medical conditions. He's been doing well since our LOV.  OSA: -Compliant with CPAP, doing well   HLD: -Medications: Zetia 10 mg, history of rhabdomyolysis on statin in 2023 -Patient denies side effects and is compliant with medication on a daily basis. -Last lipid panel: Lipid Panel     Component Value Date/Time   CHOL 140 04/26/2023 0927   CHOL 151 01/12/2016 0853   TRIG 175 (H) 04/26/2023 0927   HDL 38 (L) 04/26/2023 0927   HDL 37 (L) 01/12/2016 0853   CHOLHDL 3.7 04/26/2023 0927   VLDL 27 05/03/2017 0906   LDLCALC 75 04/26/2023 0927   LABVLDL 36 01/12/2016 0853    Diabetes, Type 2: -Last A1c 7.2% 4/24 -Medications: Metformin 500 mg BID - compliant with medication and tolerating well  -Eye exam: UTD, [facility]  -Foot exam: UTD 4/24 -Microalbumin: Due -Statin: No, history of intolerance  -PNA vaccine: UTD -Denies symptoms of hypoglycemia, polyuria, polydipsia, numbness extremities, foot ulcers/trauma.  -Exercise: walks 50 miles a week -Diet: eating toast, rice chex cereal, eggs and sausage for breakfast   Hypothyroidism: -Medications: Levothyroxine 150 mcg -S/p thyroidectomy in 2015, enlarged with calcifications  -Patient is compliant with the above medication (s) at the above dose and reports no medication side effects.  -Denies weight changes, cold./heat intolerance, skin changes, anxiety/palpitations  -Last TSH: Follows with endocrinology, was last evaluated last month where his TSH was normal per the patient.  I am unable to review these results.  He reports no other changes.  BPH/Bladder Outlet obstruction/hypotonic bladder: -Following with Urology at [facility], last seen 09/26/23.  He  is doing self-catheterization.  Denies any dysuria, hematuria or general discomfort. 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   Hypokalemia 03/03/2022   Hypomagnesemia 03/03/2022   AKI (acute kidney injury) (HCC) 03/02/2022   Severe bilateral hydroureteronephrosis 03/02/2022   BPH (benign prostatic hyperplasia) 03/02/2022   Hypercalcemia 03/02/2022   HTN (hypertension) 03/02/2022   Hematuria 03/02/2022   Renal calculus, right, Punctate nonobstructing  03/02/2022   Abdominal mass, RLQ (right lower quadrant) 03/01/2022   Uncontrolled type 2 diabetes mellitus with hyperglycemia (HCC) 03/11/2021   Elevated hemoglobin (HCC) 03/11/2021   DM2 (diabetes mellitus, type 2) (HCC) 02/06/2021   OSA (obstructive sleep apnea) 06/01/2018   Prediabetes 05/10/2018   Increased prostate specific antigen (PSA) velocity 05/08/2018   Obesity (BMI 30.0-34.9) 05/08/2018   Mixed hyperlipidemia 08/10/2016   Low testosterone 07/20/2016   Hypothyroidism 07/09/2015   Past Medical History:  Diagnosis Date   Allergy    Diabetes mellitus without complication (HCC)    Dysplastic nevus 02/25/2014   L mid ant thigh - mild   Dysplastic nevus 02/25/2014   R med popliteal - mild   Dysplastic nevus 02/25/2014   R med knee - mild   Dysplastic  nevus 12/30/2016   R pectoral 2.0 cm med to areola - mod   Dysplastic nevus 12/30/2016   L dorsum prox forearm near antecubital - mod   Elevated BP without diagnosis of hypertension 11/10/2016   Hypertension    Prediabetes 05/10/2018   Sleep apnea    Thyroid disease    Past Surgical History:   Procedure Laterality Date   COLONOSCOPY WITH PROPOFOL N/A 08/19/2023   Procedure: COLONOSCOPY WITH PROPOFOL;  Surgeon: Toney Reil, MD;  Location: [facility] ENDOSCOPY;  Service: Gastroenterology;  Laterality: N/A;  1st case   HERNIA REPAIR     childhood   KNEE SURGERY Left 1993   MOUTH SURGERY  04/05/2018   bone graft, 3 teeth pulled   POLYPECTOMY  08/19/2023   Procedure: POLYPECTOMY;  Surgeon: Toney Reil, MD;  Location: [facility] ENDOSCOPY;  Service: Gastroenterology;;   THYROID SURGERY  2015   TOTAL THYROIDECTOMY Bilateral 07/15/2014   Dr. Elenore Rota.    Social History   Tobacco Use   Smoking status: Never   Smokeless tobacco: Never  Vaping Use   Vaping status: Never Used  Substance Use Topics   Alcohol use: No    Alcohol/week: 0.0 standard drinks of alcohol   Drug use: No   Social History   Socioeconomic History   Marital status: Married    Spouse name: Not on file   Number of children: Not on file   Years of education: Not on file   Highest education level: Master's degree (e.g., MA, MS, MEng, MEd, MSW, MBA)  Occupational History    Comment: retired Optometrist  Tobacco Use   Smoking status: Never   Smokeless tobacco: Never  Vaping Use   Vaping status: Never Used  Substance and Sexual Activity   Alcohol use: No    Alcohol/week: 0.0 standard drinks of alcohol   Drug use: No   Sexual activity: Not Currently  Other Topics Concern   Not on file  Social History Narrative   Not on file   Social Drivers of Health   Financial Resource Strain: Low Risk  (01/25/2024)   Overall Financial Resource Strain (CARDIA)    Difficulty of Paying Living Expenses: Not hard at all  Food Insecurity: No Food Insecurity (01/25/2024)   Hunger Vital Sign    Worried About Running Out of Food in the Last Year: Never true    Ran Out of Food in the Last Year: Never true  Transportation Needs: No Transportation Needs (01/25/2024)   PRAPARE - Administrator, Civil Service  (Medical): No    Lack of Transportation (Non-Medical): No  Physical Activity: Sufficiently Active (01/25/2024)   Exercise Vital Sign    Days of Exercise per Week: 5 days    Minutes of Exercise per Session: 150+ min  Stress: No Stress Concern Present (01/25/2024)   [employer] of Occupational Health - Occupational Stress Questionnaire    Feeling of Stress : Not at all  Social Connections: Socially Integrated (01/25/2024)   Social Connection and Isolation Panel [NHANES]    Frequency of Communication with Friends and Family: More than three times a week    Frequency of Social Gatherings with Friends and Family: Twice a week    Attends Religious Services: More than 4 times per year    Active Member of [employer] or Organizations: Yes    Attends Engineer, structural: More than 4 times per year    Marital Status: Married  Catering manager Violence: Not At Risk (  02/17/2023)   Humiliation, Afraid, Rape, and Kick questionnaire    Fear of Current or Ex-Partner: No    Emotionally Abused: No    Physically Abused: No    Sexually Abused: No   Family Status  Relation Name Status   Father  Deceased   Mother  Deceased   PGM not sure Deceased   PGF not sure Deceased   MGM not sure Deceased   MGF not sure Deceased   Brother  Deceased   Brother  Deceased   Son  Alive  No partnership data on file   Family History  Problem Relation Age of Onset   Alcohol abuse Father    Stroke Father    Diabetes Mother    Diabetes Brother    Prostate cancer Brother    Heart attack Brother    Allergies  Allergen Reactions   Statins     Elevated CK with dark urine   Ciprofloxacin     Tendonitis    Erythromycin Rash      Review of Systems  All other systems reviewed and are negative.     Objective:     BP 130/82 (Cuff Size: Large)   Pulse 74   Temp 98 F (36.7 C) (Oral)   Resp 16   Ht 6\' 2"  (1.88 m)   Wt 220 lb 8 oz (100 kg)   SpO2 98%   BMI 28.31 kg/m  BP Readings from Last 3  Encounters:  01/26/24 130/82  10/26/23 134/76  08/19/23 105/65   Wt Readings from Last 3 Encounters:  01/26/24 220 lb 8 oz (100 kg)  10/26/23 225 lb (102.1 kg)  08/19/23 219 lb 3.2 oz (99.4 kg)      Physical Exam Constitutional:      Appearance: Normal appearance.  HENT:     Head: Normocephalic and atraumatic.     Mouth/Throat:     Mouth: Mucous membranes are moist.     Pharynx: Oropharynx is clear.  Eyes:     Extraocular Movements: Extraocular movements intact.     Conjunctiva/sclera: Conjunctivae normal.     Pupils: Pupils are equal, round, and reactive to light.  Cardiovascular:     Rate and Rhythm: Normal rate and regular rhythm.  Pulmonary:     Effort: Pulmonary effort is normal.     Breath sounds: Normal breath sounds.  Skin:    General: Skin is warm and dry.  Neurological:     General: No focal deficit present.     Mental Status: He is alert. Mental status is at baseline.  Psychiatric:        Mood and Affect: Mood normal.        Behavior: Behavior normal.      Results for orders placed or performed in visit on 01/26/24  POCT HgB A1C  Result Value Ref Range   Hemoglobin A1C 6.9 (A) 4.0 - 5.6 %   HbA1c POC (<> result, manual entry)     HbA1c, POC (prediabetic range)     HbA1c, POC (controlled diabetic range)      Last CBC Lab Results  Component Value Date   WBC 5.4 04/26/2023   HGB 16.0 04/26/2023   HCT 47.2 04/26/2023   MCV 88.6 04/26/2023   MCH 30.0 04/26/2023   RDW 12.7 04/26/2023   PLT 222 04/26/2023   Last metabolic panel Lab Results  Component Value Date   GLUCOSE 129 (H) 04/26/2023   NA 139 04/26/2023   K 4.2 04/26/2023  CL 104 04/26/2023   CO2 28 04/26/2023   BUN 19 04/26/2023   CREATININE 0.91 04/26/2023   EGFR 90 04/26/2023   CALCIUM 9.6 04/26/2023   PROT 6.8 04/26/2023   ALBUMIN 4.4 11/30/2022   LABGLOB 2.2 01/12/2016   AGRATIO 2.0 01/12/2016   BILITOT 0.8 04/26/2023   ALKPHOS 70 11/30/2022   AST 18 04/26/2023   ALT 24  04/26/2023   ANIONGAP 8 11/30/2022   Last lipids Lab Results  Component Value Date   CHOL 140 04/26/2023   HDL 38 (L) 04/26/2023   LDLCALC 75 04/26/2023   TRIG 175 (H) 04/26/2023   CHOLHDL 3.7 04/26/2023   Last hemoglobin A1c Lab Results  Component Value Date   HGBA1C 6.9 (A) 01/26/2024   Last thyroid functions No results found for: "TSH", "T3TOTAL", "T4TOTAL", "THYROIDAB" Last vitamin D Lab Results  Component Value Date   VD25OH 51 02/01/2018   Last vitamin B12 and Folate No results found for: "VITAMINB12", "FOLATE"    The 10-year ASCVD risk score (Arnett DK, et al., 2019) is: 37.6%    Assessment & Plan:  Type 2 diabetes mellitus without complication, without long-term current use of insulin (HCC) Assessment & Plan: A1c improved at 6.9%. Continue Metformin 500 mg BID.  Orders: -     POCT glycosylated hemoglobin (Hb A1C) -     Ezetimibe; Take 1 tablet (10 mg total) by mouth daily.  Dispense: 90 tablet; Refill: 1  Mixed hyperlipidemia Assessment & Plan: Stable, history of rhabdomyolysis on statin but now controlled on Zetia. Will continue Zetia and plan to recheck fasting labs at follow up.  Orders: -     Ezetimibe; Take 1 tablet (10 mg total) by mouth daily.  Dispense: 90 tablet; Refill: 1  OSA (obstructive sleep apnea) Assessment & Plan: Stable, compliant with CPAP.   Hypothyroidism, unspecified type Assessment & Plan: Following with [facility]ology at [facility], no changes made to medications.   Bladder outlet obstruction Assessment & Plan: Stable, following with Urology. Still do self-catheterization but no infections for a year now. Doing well on Flomax and Finasteride.    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      Return in about 6 months (around 07/25/2024).    Margarita Mail, DO

## 2024-02-01 ENCOUNTER — Other Ambulatory Visit: Payer: Self-pay | Admitting: Internal Medicine

## 2024-02-01 DIAGNOSIS — E119 Type 2 diabetes mellitus without complications: Secondary | ICD-10-CM

## 2024-02-02 NOTE — Telephone Encounter (Signed)
 Last reordered 10/26/23 #180 1 RF  Requested Prescriptions  Refused Prescriptions Disp Refills   metFORMIN  (GLUCOPHAGE ) 500 MG tablet [Pharmacy Med Name: METFORMIN  HCL 500 MG TAB] 180 tablet 1    Sig: TAKE 1 TABLET BY MOUTH TWICE DAILY WITH MEALS     Endocrinology:  Diabetes - Biguanides Failed - 02/02/2024  1:38 PM      Failed - B12 Level in normal range and within 720 days    No results found for: VITAMINB12       Passed - Cr in normal range and within 360 days    Creat  Date Value Ref Range Status  04/26/2023 0.91 0.70 - 1.28 mg/dL Final   Creatinine, Urine  Date Value Ref Range Status  10/26/2023 124 20 - 320 mg/dL Final         Passed - HBA1C is between 0 and 7.9 and within 180 days    Hemoglobin A1C  Date Value Ref Range Status  01/26/2024 6.9 (A) 4.0 - 5.6 % Final   Hgb A1c MFr Bld  Date Value Ref Range Status  04/26/2023 7.2 (H) <5.7 % of total Hgb Final    Comment:    For someone without known diabetes, a hemoglobin A1c value of 6.5% or greater indicates that they may have  diabetes and this should be confirmed with a follow-up  test. . For someone with known diabetes, a value <7% indicates  that their diabetes is well controlled and a value  greater than or equal to 7% indicates suboptimal  control. A1c targets should be individualized based on  duration of diabetes, age, comorbid conditions, and  other considerations. . Currently, no consensus exists regarding use of hemoglobin A1c for diagnosis of diabetes for children. .          Passed - eGFR in normal range and within 360 days    GFR, Est African American  Date Value Ref Range Status  04/22/2021 91 > OR = 60 mL/min/1.75m2 Final   GFR, Est Non African American  Date Value Ref Range Status  04/22/2021 79 > OR = 60 mL/min/1.72m2 Final   GFR, Estimated  Date Value Ref Range Status  11/30/2022 >60 >60 mL/min Final    Comment:    (NOTE) Calculated using the CKD-EPI Creatinine Equation (2021)     eGFR  Date Value Ref Range Status  04/26/2023 90 > OR = 60 mL/min/1.21m2 Final         Passed - Valid encounter within last 6 months    Recent Outpatient Visits           1 week ago Type 2 diabetes mellitus without complication, without long-term current use of insulin  [facility])   [facility] Bernardo Fend, DO   3 months ago Type 2 diabetes mellitus without complication, without long-term current use of insulin  [facility])   [facility] Bernardo Fend, DO   9 months ago Type 2 diabetes mellitus without complication, without long-term current use of insulin  [facility])   [facility] Bernardo Fend, DO   1 year ago Type 2 diabetes mellitus with hyperglycemia, without long-term current use of insulin  [facility])   [facility] Bernardo Fend, DO   1 year ago COVID-19   [facility] Bernardo Fend, DO       Future Appointments             In 5 months Bernardo Fend, DO [facility]            Passed - CBC within normal limits and completed in the last 12 months    WBC  Date Value Ref Range Status  04/26/2023 5.4 3.8 - 10.8 Thousand/uL Final   RBC  Date Value Ref Range Status  04/26/2023 5.33 4.20 - 5.80 Million/uL Final   Hemoglobin  Date Value Ref Range Status  04/26/2023 16.0 13.2 - 17.1 g/dL Final  93/76/7982 84.0 12.6 - 17.7 g/dL Final   HCT  Date Value Ref Range Status  04/26/2023 47.2 38.5 - 50.0 % Final   Hematocrit  Date Value Ref Range Status  06/18/2016 45.3 37.5 - 51.0 % Final   MCHC  Date Value Ref Range Status  04/26/2023 33.9 32.0 - 36.0 g/dL Final   [facility]  Date Value Ref Range Status  04/26/2023 30.0 27.0 - 33.0 pg Final   MCV  Date Value Ref Range Status  04/26/2023 88.6 80.0 - 100.0 fL Final  06/18/2016 90 79 - 97 fL Final  11/08/2014 92 80 - 100 fL Final   No results  found for: PLTCOUNTKUC, LABPLAT, POCPLA RDW  Date Value Ref Range Status  04/26/2023 12.7 11.0 - 15.0 % Final  06/18/2016 13.5 12.3 - 15.4 % Final  11/08/2014 13.6 11.5 - 14.5 % Final

## 2024-02-03 DIAGNOSIS — R369 Urethral discharge, unspecified: Secondary | ICD-10-CM | POA: Diagnosis not present

## 2024-02-03 DIAGNOSIS — N390 Urinary tract infection, site not specified: Secondary | ICD-10-CM | POA: Diagnosis not present

## 2024-02-10 DIAGNOSIS — R339 Retention of urine, unspecified: Secondary | ICD-10-CM | POA: Diagnosis not present

## 2024-02-23 ENCOUNTER — Ambulatory Visit: Payer: Medicare PPO

## 2024-02-23 DIAGNOSIS — Z0000 Encounter for general adult medical examination without abnormal findings: Secondary | ICD-10-CM | POA: Diagnosis not present

## 2024-02-23 NOTE — Patient Instructions (Addendum)
 Alan Holt , Thank you for taking time to come for your Medicare Wellness Visit. I appreciate your ongoing commitment to your health goals. Please review the following plan we discussed and let me know if I can assist you in the future.   Referrals/Orders/Follow-Ups/Clinician Recommendations: NONE  This is a list of the screening recommended for you and due dates:  Health Maintenance  Topic Date Due   Zoster (Shingles) Vaccine (1 of 2) Never done   COVID-19 Vaccine (7 - 2024-25 season) 12/05/2023   Yearly kidney function blood test for diabetes  04/25/2024   Complete foot exam   04/25/2024   Hemoglobin A1C  07/25/2024   Eye exam for diabetics  10/04/2024   Yearly kidney health urinalysis for diabetes  10/25/2024   DTaP/Tdap/Td vaccine (2 - Td or Tdap) 11/08/2024   Medicare Annual Wellness Visit  02/22/2025   Colon Cancer Screening  08/18/2030   Pneumonia Vaccine  Completed   Flu Shot  Completed   Hepatitis C Screening  Completed   HPV Vaccine  Aged Out    Advanced directives: (ACP Link)Information on Advanced Care Planning can be found at [facility] Advance Health Care Directives Advance Health Care Directives ([email or web address]   Next Medicare Annual Wellness Visit scheduled for next year: Yes   02/28/25 @ 8:50 AM BY VIDEO

## 2024-02-23 NOTE — Progress Notes (Signed)
 Subjective:   Alan Holt is a 73 y.o. who presents for a Medicare Wellness preventive visit.  Visit Complete: Virtual I connected with  [name] on 02/23/24 by a video and audio enabled telemedicine application and verified that I am speaking with the correct person using two identifiers.  Patient Location: Home  Provider Location: Office/Clinic  I discussed the limitations of evaluation and management by telemedicine. The patient expressed understanding and agreed to proceed.  Vital Signs: Because this visit was a virtual/telehealth visit, some criteria may be missing or patient reported. Any vitals not documented were not able to be obtained and vitals that have been documented are patient reported.   AWV Questionnaire: No: Patient Medicare AWV questionnaire was not completed prior to this visit.  Cardiac Risk Factors include: advanced age (>68men, >45 women);diabetes mellitus;dyslipidemia;male gender;hypertension     Objective:    There were no vitals filed for this visit. There is no height or weight on file to calculate BMI.     02/23/2024    9:47 AM 08/19/2023    6:53 AM 02/17/2023   11:02 AM 11/30/2022    7:21 AM 04/02/2022   12:55 PM 04/02/2022   11:48 AM 03/02/2022    6:00 PM  Advanced Directives  Does Patient Have a Medical Advance Directive? No No Yes No No No Yes  Type of Surveyor, minerals;Living will    Healthcare Power of Attorney  Does patient want to make changes to medical advance directive?       No - Patient declined  Copy of Healthcare Power of Attorney in Chart?   No - copy requested    No - copy requested  Would patient like information on creating a medical advance directive? No - Patient declined   No - Patient declined No - Patient declined No - Patient declined     Current Medications (verified) Outpatient Encounter Medications as of 02/23/2024  Medication Sig   Accu-Chek Softclix Lancets lancets USE AS DIRECTED TO  MONITOR FASTING BLOODGLUCOSE ONCE DAILY AS NEEDED   acetaminophen (TYLENOL) 325 MG tablet Take 2 tablets (650 mg total) by mouth every 6 (six) hours as needed for moderate pain, mild pain or fever.   Blood Glucose Monitoring Suppl (ACCU-CHEK GUIDE) w/Device KIT See admin instructions.   ezetimibe (ZETIA) 10 MG tablet Take 1 tablet (10 mg total) by mouth daily.   finasteride (PROSCAR) 5 MG tablet Take 1 tablet (5 mg total) by mouth daily.   glucose blood (ACCU-CHEK GUIDE) test strip USE AS DIRECTED TO MONITOR FASTING BLOODGLUCOSE ONCE DAILY AS NEEDED   levothyroxine (SYNTHROID, LEVOTHROID) 150 MCG tablet Take 1 tablet by mouth daily.   metFORMIN (GLUCOPHAGE) 500 MG tablet Take 1 tablet (500 mg total) by mouth 2 (two) times daily with a meal.   tamsulosin (FLOMAX) 0.4 MG CAPS capsule Take 1 capsule (0.4 mg total) by mouth daily.   No facility-administered encounter medications on file as of 02/23/2024.    Allergies (verified) Statins, Ciprofloxacin, and Erythromycin   History: Past Medical History:  Diagnosis Date   Allergy    Diabetes mellitus without complication (HCC)    Dysplastic nevus 02/25/2014   L mid ant thigh - mild   Dysplastic nevus 02/25/2014   R med popliteal - mild   Dysplastic nevus 02/25/2014   R med knee - mild   Dysplastic nevus 12/30/2016   R pectoral 2.0 cm med to areola - mod   Dysplastic  nevus 12/30/2016   L dorsum prox forearm near antecubital - mod   Elevated BP without diagnosis of hypertension 11/10/2016   Hypertension    Prediabetes 05/10/2018   Sleep apnea    Thyroid disease    Past Surgical History:  Procedure Laterality Date   COLONOSCOPY WITH PROPOFOL N/A 08/19/2023   Procedure: COLONOSCOPY WITH PROPOFOL;  Surgeon: Toney Reil, MD;  Location: [facility] ENDOSCOPY;  Service: Gastroenterology;  Laterality: N/A;  1st case   HERNIA REPAIR     childhood   KNEE SURGERY Left 1993   MOUTH SURGERY  04/05/2018   bone graft, 3 teeth pulled    POLYPECTOMY  08/19/2023   Procedure: POLYPECTOMY;  Surgeon: Toney Reil, MD;  Location: [facility] ENDOSCOPY;  Service: Gastroenterology;;   THYROID SURGERY  2015   TOTAL THYROIDECTOMY Bilateral 07/15/2014   Dr. Elenore Rota.    Family History  Problem Relation Age of Onset   Alcohol abuse Father    Stroke Father    Diabetes Mother    Diabetes Brother    Prostate cancer Brother    Heart attack Brother    Social History   Socioeconomic History   Marital status: Married    Spouse name: Not on file   Number of children: Not on file   Years of education: Not on file   Highest education level: Master's degree (e.g., MA, MS, MEng, MEd, MSW, MBA)  Occupational History    Comment: retired Optometrist  Tobacco Use   Smoking status: Never   Smokeless tobacco: Never  Vaping Use   Vaping status: Never Used  Substance and Sexual Activity   Alcohol use: No    Alcohol/week: 0.0 standard drinks of alcohol   Drug use: No   Sexual activity: Not Currently  Other Topics Concern   Not on file  Social History Narrative   Not on file   Social Drivers of Health   Financial Resource Strain: Low Risk  (02/23/2024)   Overall Financial Resource Strain (CARDIA)    Difficulty of Paying Living Expenses: Not hard at all  Food Insecurity: No Food Insecurity (02/23/2024)   Hunger Vital Sign    Worried About Running Out of Food in the Last Year: Never true    Ran Out of Food in the Last Year: Never true  Transportation Needs: No Transportation Needs (02/23/2024)   PRAPARE - Administrator, Civil Service (Medical): No    Lack of Transportation (Non-Medical): No  Physical Activity: Sufficiently Active (02/23/2024)   Exercise Vital Sign    Days of Exercise per Week: 5 days    Minutes of Exercise per Session: 150+ min  Stress: No Stress Concern Present (02/23/2024)   [employer] of Occupational Health - Occupational Stress Questionnaire    Feeling of Stress : Not at all  Social  Connections: Moderately Integrated (02/23/2024)   Social Connection and Isolation Panel [NHANES]    Frequency of Communication with Friends and Family: More than three times a week    Frequency of Social Gatherings with Friends and Family: Three times a week    Attends Religious Services: More than 4 times per year    Active Member of Clubs or Organizations: No    Attends Banker Meetings: Never    Marital Status: Married    Tobacco Counseling Counseling given: Not Answered    Clinical Intake:  Pre-visit preparation completed: Yes  Pain : No/denies pain     BMI - recorded: 28.2 Nutritional Status:  BMI 25 -29 Overweight Nutritional Risks: None Diabetes: Yes CBG done?: No Did pt. bring in CBG monitor from home?: No  How often do you need to have someone help you when you read instructions, pamphlets, or other written materials from your doctor or pharmacy?: 1 - Never  Interpreter Needed?: No  Information entered by :: Kennedy Bucker, LPN   Activities of Daily Living     02/23/2024    9:49 AM 02/22/2024    7:56 PM  In your present state of health, do you have any difficulty performing the following activities:  Hearing? 0 0  Vision? 0 0  Difficulty concentrating or making decisions? 0 0  Walking or climbing stairs? 0 0  Dressing or bathing? 0 0  Doing errands, shopping? 0 0  Preparing Food and eating ? N N  Using the Toilet? N N  In the past six months, have you accidently leaked urine? N N  Do you have problems with loss of bowel control? N N  Managing your Medications? N N  Managing your Finances? N N  Housekeeping or managing your Housekeeping? N N    Patient Care Team: Margarita Mail, DO as PCP - General (Internal Medicine) Vernie Murders, MD as Consulting Physician (Otolaryngology) Deirdre Evener, MD (Dermatology) [place] Crane, MD as Consulting Physician (Ophthalmology)  Indicate any recent Medical Services you may have received  from other than [facility] providers in the past year (date may be approximate).     Assessment:   This is a routine wellness examination for Jaquon.  Hearing/Vision screen Hearing Screening - Comments:: WEARS AIDS, BOTH EARS Vision Screening - Comments:: WEARS GLASSES ALL THE TIME- [facility] DR.KING   Goals Addressed             This Visit's Progress    DIET - EAT MORE FRUITS AND VEGETABLES         Depression Screen     02/23/2024    9:43 AM 01/26/2024    8:47 AM 10/26/2023    8:26 AM 04/26/2023    8:44 AM 02/17/2023   10:55 AM 10/25/2022    8:15 AM 07/12/2022    1:51 PM  PHQ 2/9 Scores  PHQ - 2 Score 0 0 0 0 0 0 0  PHQ- 9 Score 0  0 0  0 0    Fall Risk     02/23/2024    9:49 AM 02/22/2024    7:56 PM 01/26/2024    8:47 AM 10/26/2023    8:22 AM 04/26/2023    8:42 AM  Fall Risk   Falls in the past year? 0 0 0 0 0  Number falls in past yr: 0 0 0 0 0  Injury with Fall? 0 0 0 0 0  Risk for fall due to : No Fall Risks      Follow up Falls prevention discussed;Falls evaluation completed  Falls evaluation completed      MEDICARE RISK AT HOME:  Medicare Risk at Home Any stairs in or around the home?: No If so, are there any without handrails?: No Home free of loose throw rugs in walkways, pet beds, electrical cords, etc?: Yes Adequate lighting in your home to reduce risk of falls?: Yes Life alert?: No Use of a cane, walker or w/c?: No Grab bars in the bathroom?: Yes Shower chair or bench in shower?: No Elevated toilet seat or a handicapped toilet?: No  TIMED UP AND GO:  Was the test performed?  No  Cognitive Function: 6CIT completed        02/23/2024    9:50 AM 02/17/2023   11:04 AM 02/06/2019    2:17 PM  6CIT Screen  What Year? 0 points 0 points 0 points  What month? 0 points 0 points 0 points  What time? 0 points 0 points 0 points  Count back from 20 0 points 0 points 0 points  Months in reverse 0 points 0 points 0 points  Repeat phrase 0 points 0 points 0  points  Total Score 0 points 0 points 0 points    Immunizations Immunization History  Administered Date(s) Administered   Fluad Quad(high Dose 65+) 10/12/2019   Influenza, High Dose Seasonal PF 10/23/2018, 10/20/2021   Influenza-Unspecified 10/05/2016, 09/26/2020, 10/10/2023   PFIZER(Purple Top)SARS-COV-2 Vaccination 01/21/2020, 02/12/2020, 10/13/2020, 05/27/2021   PNEUMOCOCCAL CONJUGATE-20 04/23/2022   Pfizer Covid-19 Vaccine Bivalent Booster 68yrs & up 10/20/2021   Pfizer(Comirnaty)Fall Seasonal Vaccine 12 years and older 10/10/2023   Pneumococcal Conjugate-13 10/26/2017   Pneumococcal Polysaccharide-23 11/09/2018   Tdap 11/08/2014    Screening Tests Health Maintenance  Topic Date Due   Zoster Vaccines- Shingrix (1 of 2) Never done   COVID-19 Vaccine (7 - 2024-25 season) 12/05/2023   Diabetic kidney evaluation - eGFR measurement  04/25/2024   FOOT EXAM  04/25/2024   HEMOGLOBIN A1C  07/25/2024   OPHTHALMOLOGY EXAM  10/04/2024   Diabetic kidney evaluation - Urine ACR  10/25/2024   DTaP/Tdap/Td (2 - Td or Tdap) 11/08/2024   Medicare Annual Wellness (AWV)  02/22/2025   Colonoscopy  08/18/2030   Pneumonia Vaccine 63+ Years old  Completed   INFLUENZA VACCINE  Completed   Hepatitis C Screening  Completed   HPV VACCINES  Aged Out    Health Maintenance  Health Maintenance Due  Topic Date Due   Zoster Vaccines- Shingrix (1 of 2) Never done   COVID-19 Vaccine (7 - 2024-25 season) 12/05/2023   Health Maintenance Items Addressed: UP TO DATEDellie Burns  Additional Screening:  Vision Screening: Recommended annual ophthalmology exams for early detection of glaucoma and other disorders of the eye.  Dental Screening: Recommended annual dental exams for proper oral hygiene  Community Resource Referral / Chronic Care Management: CRR required this visit?  No   CCM required this visit?  No     Plan:     I have personally reviewed and noted the following in the  patient's chart:   Medical and social history Use of alcohol, tobacco or illicit drugs  Current medications and supplements including opioid prescriptions. Patient is not currently taking opioid prescriptions. Functional ability and status Nutritional status Physical activity Advanced directives List of other physicians Hospitalizations, surgeries, and ER visits in previous 12 months Vitals Screenings to include cognitive, depression, and falls Referrals and appointments  In addition, I have reviewed and discussed with patient certain preventive protocols, quality metrics, and best practice recommendations. A written personalized care plan for preventive services as well as general preventive health recommendations were provided to patient.     Hal Hope, LPN   04/04/8118   After Visit Summary: (MyChart) Due to this being a telephonic visit, the after visit summary with patients personalized plan was offered to patient via MyChart   Notes: Nothing significant to report at this time.

## 2024-05-01 ENCOUNTER — Other Ambulatory Visit: Payer: Self-pay | Admitting: Internal Medicine

## 2024-05-01 DIAGNOSIS — E119 Type 2 diabetes mellitus without complications: Secondary | ICD-10-CM

## 2024-05-02 NOTE — Telephone Encounter (Signed)
 Requested medication (s) are due for refill today - yes  Requested medication (s) are on the active medication list -yes  Future visit scheduled -yes  Last refill: 10/26/23 #180 1RF  Notes to clinic: fails lab protocol- over 1 year- 04/26/23  Requested Prescriptions  Pending Prescriptions Disp Refills   metFORMIN  (GLUCOPHAGE ) 500 MG tablet [Pharmacy Med Name: METFORMIN  HCL 500 MG TAB] 180 tablet 1    Sig: TAKE 1 TABLET BY MOUTH TWICE DAILY WITH MEALS     Endocrinology:  Diabetes - Biguanides Failed - 05/02/2024  3:36 PM      Failed - Cr in normal range and within 360 days    Creat  Date Value Ref Range Status  04/26/2023 0.91 0.70 - 1.28 mg/dL Final   Creatinine, Urine  Date Value Ref Range Status  10/26/2023 124 20 - 320 mg/dL Final         Failed - eGFR in normal range and within 360 days    GFR, Est African American  Date Value Ref Range Status  04/22/2021 91 > OR = 60 mL/min/1.69m2 Final   GFR, Est Non African American  Date Value Ref Range Status  04/22/2021 79 > OR = 60 mL/min/1.41m2 Final   GFR, Estimated  Date Value Ref Range Status  11/30/2022 >60 >60 mL/min Final    Comment:    (NOTE) Calculated using the CKD-EPI Creatinine Equation (2021)    eGFR  Date Value Ref Range Status  04/26/2023 90 > OR = 60 mL/min/1.39m2 Final         Failed - B12 Level in normal range and within 720 days    No results found for: "VITAMINB12"       Failed - CBC within normal limits and completed in the last 12 months    WBC  Date Value Ref Range Status  04/26/2023 5.4 3.8 - 10.8 Thousand/uL Final   RBC  Date Value Ref Range Status  04/26/2023 5.33 4.20 - 5.80 Million/uL Final   Hemoglobin  Date Value Ref Range Status  04/26/2023 16.0 13.2 - 17.1 g/dL Final  86/57/8469 62.9 12.6 - 17.7 g/dL Final   HCT  Date Value Ref Range Status  04/26/2023 47.2 38.5 - 50.0 % Final   Hematocrit  Date Value Ref Range Status  06/18/2016 45.3 37.5 - 51.0 % Final   MCHC  Date  Value Ref Range Status  04/26/2023 33.9 32.0 - 36.0 g/dL Final   [facility]  Date Value Ref Range Status  04/26/2023 30.0 27.0 - 33.0 pg Final   MCV  Date Value Ref Range Status  04/26/2023 88.6 80.0 - 100.0 fL Final  06/18/2016 90 79 - 97 fL Final  11/08/2014 92 80 - 100 fL Final   No results found for: "PLTCOUNTKUC", "LABPLAT", "POCPLA" RDW  Date Value Ref Range Status  04/26/2023 12.7 11.0 - 15.0 % Final  06/18/2016 13.5 12.3 - 15.4 % Final  11/08/2014 13.6 11.5 - 14.5 % Final         Passed - HBA1C is between 0 and 7.9 and within 180 days    Hemoglobin A1C  Date Value Ref Range Status  01/26/2024 6.9 (A) 4.0 - 5.6 % Final   Hgb A1c MFr Bld  Date Value Ref Range Status  04/26/2023 7.2 (H) <5.7 % of total Hgb Final    Comment:    For someone without known diabetes, a hemoglobin A1c value of 6.5% or greater indicates that they may have  diabetes and this should  be confirmed with a follow-up  test. . For someone with known diabetes, a value <7% indicates  that their diabetes is well controlled and a value  greater than or equal to 7% indicates suboptimal  control. A1c targets should be individualized based on  duration of diabetes, age, comorbid conditions, and  other considerations. . Currently, no consensus exists regarding use of hemoglobin A1c for diagnosis of diabetes for children. Temple Feeler - Valid encounter within last 6 months    Recent Outpatient Visits   None     Future Appointments             In 2 months Rockney Cid, DO [place] [facility], [facility]               Requested Prescriptions  Pending Prescriptions Disp Refills   metFORMIN  (GLUCOPHAGE ) 500 MG tablet [Pharmacy Med Name: METFORMIN  HCL 500 MG TAB] 180 tablet 1    Sig: TAKE 1 TABLET BY MOUTH TWICE DAILY WITH MEALS     Endocrinology:  Diabetes - Biguanides Failed - 05/02/2024  3:36 PM      Failed - Cr in normal range and within 360 days    Creat  Date  Value Ref Range Status  04/26/2023 0.91 0.70 - 1.28 mg/dL Final   Creatinine, Urine  Date Value Ref Range Status  10/26/2023 124 20 - 320 mg/dL Final         Failed - eGFR in normal range and within 360 days    GFR, Est African American  Date Value Ref Range Status  04/22/2021 91 > OR = 60 mL/min/1.55m2 Final   GFR, Est Non African American  Date Value Ref Range Status  04/22/2021 79 > OR = 60 mL/min/1.29m2 Final   GFR, Estimated  Date Value Ref Range Status  11/30/2022 >60 >60 mL/min Final    Comment:    (NOTE) Calculated using the CKD-EPI Creatinine Equation (2021)    eGFR  Date Value Ref Range Status  04/26/2023 90 > OR = 60 mL/min/1.73m2 Final         Failed - B12 Level in normal range and within 720 days    No results found for: "VITAMINB12"       Failed - CBC within normal limits and completed in the last 12 months    WBC  Date Value Ref Range Status  04/26/2023 5.4 3.8 - 10.8 Thousand/uL Final   RBC  Date Value Ref Range Status  04/26/2023 5.33 4.20 - 5.80 Million/uL Final   Hemoglobin  Date Value Ref Range Status  04/26/2023 16.0 13.2 - 17.1 g/dL Final  09/81/1914 78.2 12.6 - 17.7 g/dL Final   HCT  Date Value Ref Range Status  04/26/2023 47.2 38.5 - 50.0 % Final   Hematocrit  Date Value Ref Range Status  06/18/2016 45.3 37.5 - 51.0 % Final   MCHC  Date Value Ref Range Status  04/26/2023 33.9 32.0 - 36.0 g/dL Final   [facility]  Date Value Ref Range Status  04/26/2023 30.0 27.0 - 33.0 pg Final   MCV  Date Value Ref Range Status  04/26/2023 88.6 80.0 - 100.0 fL Final  06/18/2016 90 79 - 97 fL Final  11/08/2014 92 80 - 100 fL Final   No results found for: "PLTCOUNTKUC", "LABPLAT", "POCPLA" RDW  Date Value Ref Range Status  04/26/2023 12.7 11.0 - 15.0 % Final  06/18/2016 13.5 12.3 - 15.4 % Final  11/08/2014 13.6 11.5 - 14.5 % Final         Passed - HBA1C is between 0 and 7.9 and within 180 days    Hemoglobin A1C  Date Value Ref Range Status   01/26/2024 6.9 (A) 4.0 - 5.6 % Final   Hgb A1c MFr Bld  Date Value Ref Range Status  04/26/2023 7.2 (H) <5.7 % of total Hgb Final    Comment:    For someone without known diabetes, a hemoglobin A1c value of 6.5% or greater indicates that they may have  diabetes and this should be confirmed with a follow-up  test. . For someone with known diabetes, a value <7% indicates  that their diabetes is well controlled and a value  greater than or equal to 7% indicates suboptimal  control. A1c targets should be individualized based on  duration of diabetes, age, comorbid conditions, and  other considerations. . Currently, no consensus exists regarding use of hemoglobin A1c for diagnosis of diabetes for children. Temple Feeler - Valid encounter within last 6 months    Recent Outpatient Visits   None     Future Appointments             In 2 months Rockney Cid, DO [place] [facility], [facility]

## 2024-06-04 ENCOUNTER — Telehealth: Payer: Self-pay | Admitting: Internal Medicine

## 2024-06-04 NOTE — Telephone Encounter (Signed)
 Copied from CRM [id number]. Topic: General - Other >> Jun 04, 2024  3:01 PM Turkey B wrote: Reason for CRM: Tillman Folks from [facility] called in , asking if fax that was faxed June 7, for drug therapy has been received

## 2024-06-06 NOTE — Telephone Encounter (Signed)
 Have not received paperwork. Please refax

## 2024-06-17 DIAGNOSIS — M1712 Unilateral primary osteoarthritis, left knee: Principal | ICD-10-CM | POA: Insufficient documentation

## 2024-06-29 NOTE — Discharge Instructions (Signed)
 Instructions after Total Knee Replacement   Alan Holt P. Angie Fava., M.D.    Dept. of Orthopaedics & Sports Medicine [facility] [address] [place], [place]  [address]  Phone: [phone]   Fax: [phone]       [email or web address]       DIET: Drink plenty of non-alcoholic fluids. Resume your normal diet. Include foods high in fiber.  ACTIVITY:  You may use crutches or a walker with weight-bearing as tolerated, unless instructed otherwise. You may be weaned off of the walker or crutches by your Physical Therapist.  Do NOT place pillows under the knee. Anything placed under the knee could limit your ability to straighten the knee.   Use the Bone Foam 3 times a day for 30 minutes each session to help straighten the knee. Continue doing gentle exercises. Exercising will reduce the pain and swelling, increase motion, and prevent muscle weakness.   Please continue to use the TED compression stockings for 6 weeks. You may remove the stockings at night, but should reapply them in the morning. Do not drive or operate any equipment until instructed.  WOUND CARE:  The initial dressing (Aquacel) can remain in place for 7 days (see separate instructions). Continue to use the PolarCare or ice packs periodically to reduce pain and swelling. You may bathe or shower after the staples are removed at the first office visit following surgery.  MEDICATIONS: You may resume your regular medications. Please take the pain medication as prescribed on the medication. Do not take pain medication on an empty stomach. Unless instructed otherwise, you should take an enteric-coated aspirin 81 mg. TWICE a day. (This along with elevation will help reduce the possibility of blood clots/phlebitis in your operated leg.) Use a stool softener (such as Senokot-S or Colace) daily and a laxative (such as Miralax or Dulcolax) as needed to prevent constipation.  Do not drive or drink alcoholic beverages when  taking pain medications.  CALL THE OFFICE FOR: Temperature above 101 degrees Excessive bleeding or drainage on the dressing. Excessive swelling, coldness, or paleness of the toes. Persistent nausea and vomiting.  FOLLOW-UP:  You should have an appointment to return to the office in 10-14 days after surgery. Arrangements have been made for continuation of Physical Therapy (either home therapy or outpatient therapy).     [facility] Department Directory         [email or web address]       [email or web address]          Cardiology  Appointments: [place] - [phone]  Endocrinology  Appointments: [place] [phone] [place] - [phone]  Gastroenterology  Appointments: [employer] [phone] [place] - [phone]        General Surgery   Appointments: [facility]  Internal Medicine/Family Medicine  Appointments: [facility] [place] - [phone] [place] - [phone]  Metabolic and Weigh Loss Surgery  Appointments: [facility]        Neurology  Appointments: [place] [phone] [place] - [phone]  Neurosurgery  Appointments: [place]  Obstetrics & Gynecology  Appointments: [place] [phone] [place] - [phone]        Pediatrics  Appointments: [place] [phone] [place] - [phone]  Physiatry  Appointments: [place] [phone]  Physical Therapy  Appointments: [place] - [phone]        Podiatry  Appointments: [place] [phone] [place] - [phone]  Pulmonology  Appointments: [place] [facility]  Rheumatology  Appointments: [place] [phone]  [place] Location: [facility]  [address] [place], [place]  [address]  [place] Location: [facility]. [address] [place], [place]  [address]  [place]  Location: [facility] [address] [place], [place]  [address]

## 2024-06-29 NOTE — H&P (Signed)
 ORTHOPAEDIC HISTORY & PHYSICAL Alan Holt, Alan Holt., MD - 06/15/2024 10:00 AM EDT Formatting of this note is different from the original. Images from the original note were not included. Chief Complaint: Chief Complaint Patient presents with Left Knee - Pain New Patient  Reason for Visit: The patient is a 73 y.o. male who presents today with his wife for evaluation of his left knee. He underwent left ACL reconstruction in 1993 as per Dr. Helayne Holt. He had done reasonably well until her sustained a fall last month and had the onset of severe right knee pain and swelling. He was initially evaluated at [facility]. He localizes most of the pain along the lateral aspect of the knee. He reports significant swelling, no locking, and some giving way of the knee. The pain is aggravated by any weight bearing. The knee pain limits the patient's ability to ambulate long distances. The patient has not appreciated any significant improvement despite Tylenol , ice, elevation, activity modification and ambulatory aids. He is using a walker for ambulation. The patient states that the knee pain has progressed to the point that it is significantly interfering with his activities of daily living.  Medications: Current Outpatient Medications Medication Sig Dispense Refill ACCU-CHEK GUIDE TEST STRIPS test strip acetaminophen  (TYLENOL ) 500 MG tablet Take 500 mg by mouth as needed for Pain aspirin 81 MG chewable tablet Take by mouth calcium  citrate (CALCITRATE) 200 mg (950 mg) tablet Take by mouth once daily cholecalciferol (VITAMIN D3) 1000 unit tablet Take 1 tablet by mouth once daily EUA PAXLOVID  150 mg (10)- 100 mg (10) tablet ezetimibe  (ZETIA ) 10 mg tablet Take 10 mg by mouth once daily finasteride  (PROSCAR ) 5 mg tablet Take 1 tablet (5 mg total) by mouth once daily 30 tablet 11 levothyroxine  (SYNTHROID , LEVOTHROID) 137 MCG tablet Take by mouth every other day levothyroxine  (SYNTHROID , LEVOTHROID)  150 MCG tablet Take by mouth every other day metFORMIN  (GLUCOPHAGE ) 500 MG tablet Take 500 mg by mouth daily with breakfast multivit-min/FA/lycopen/lutein (MEN 50 PLUS MULTIVITAMIN ORAL) Take 1 tablet by mouth once daily tamsulosin  (FLOMAX ) 0.4 mg capsule TAKE ONE CAPSULE DAILY 30 MINUTES AFTER THE SAME MEAL EACH DAY 30 capsule 11  No current facility-administered medications for this visit.  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 (CMS/HHS-HCC) 2022 Hyperlipidemia Hypothyroid Increased prostate specific antigen (PSA) velocity 05/08/2018 Last Assessment & Plan: Check PSA today Low testosterone  07/20/2016 Obesity (BMI 30.0-34.9) 05/08/2018 Last Assessment & Plan: He'll try to lose back down to around 225 pounds Prediabetes 05/10/2018 Sleep apnea 06/01/2018 CPAP  Past Surgical History: Past Surgical History: Procedure Laterality Date INGUINAL HERNIA REPAIR Right 1951 ARTHROSCOPIC REPAIR ACL Left 1993 COLONOSCOPY 03/22/2003 1 polyp w/no pathological change THYROIDECTOMY TOTAL 06/2014 COLONOSCOPY 05/25/2013, 04/12/2008 PH Adenomatous Polyp: CBF 04/2018; Recall Ltr mailed 03/24/2018 (dh)  Social History: Social History  Socioeconomic History Marital status: Married Spouse name: Alan Holt Number of children: 1 Years of education: 18 Occupational History Occupation: Retired Tobacco Use Smoking status: Never Smokeless tobacco: Never Vaping Use Vaping status: Never Used Substance and Sexual Activity Alcohol use: Never Drug use: Never Sexual activity: Defer Partners: Female  Social Drivers of Catering manager Strain: Low Risk (06/15/2024) Overall  Financial Resource Strain (CARDIA) Difficulty of Paying Living Expenses: Not hard at all Food Insecurity:  No Food Insecurity (06/15/2024) Hunger Vital Sign Worried About Running Out of Food in the Last Year: Never true Ran Out of Food in the Last Year: Never true Transportation Needs: No Transportation Needs (06/15/2024) PRAPARE - Contractor (Medical): No Lack of Transportation (Non-Medical): No Physical Activity: Sufficiently Active (02/23/2024) Received from [facility] Exercise Vital Sign On average, how many days per week do you engage in moderate to strenuous exercise (like a brisk walk)?: 5 days On average, how many minutes do you engage in exercise at this level?: 150+ min Stress: No Stress Concern Present (02/23/2024) Received from [facility] Occupational Health - Occupational Stress Questionnaire Feeling of Stress : Not at all Social Connections: Moderately Integrated (02/23/2024) Received from [facility] Social Connection and Isolation Panel In a typical week, how many times do you talk on the phone with family, friends, or neighbors?: More than three times a week How often do you get together with friends or relatives?: Three times a week How often do you attend church or religious services?: More than 4 times per year Do you belong to any clubs or organizations such as church groups, unions, fraternal or athletic groups, or school groups?: No How often do you attend meetings of the clubs or organizations you belong to?: Never Are you married, widowed, divorced, separated, never married, or living with a partner?: Married Housing Stability: Unknown (06/15/2024) Housing Stability Vital Sign Unable to Pay for Housing in the Last Year: No Homeless in the Last Year: No  Family History: Family History Problem Relation Name Age of Onset Diabetes Mother Stroke Father Diabetes Brother Myocardial Infarction (Heart attack)  Brother Coronary Artery Disease (Blocked arteries around heart) Brother Prostate cancer Brother Colon polyps Neg Hx Colon cancer Neg Hx  Review of Systems: A comprehensive 14 point ROS was performed, reviewed, and the pertinent orthopaedic findings are documented in the HPI.  Exam BP 128/68  Ht 185.4 cm (6' 1)  Wt 98.6 kg (217 lb 6.4 oz)  BMI 28.68 kg/m  General: Well-developed, well-nourished male seen in no acute distress. Antalgic gait. Varus thrust to the left knee.  HEENT: Atraumatic, normocephalic. Pupils are equal and reactive to light. Extraocular motion is intact. Sclera are clear. Oropharynx is clear with moist mucosa.  Neck: Supple, nontender, and with good ROM. No thyromegaly, adenopathy, JVD, or carotid bruits.  Lungs: Clear to auscultation bilaterally.  Cardiovascular: Regular rate and rhythm. Normal S1, S2. No murmur . No appreciable gallops or rubs. Peripheral pulses are palpable. No lower extremity edema. Homan`s test is negative.  Abdomen: Soft, nontender, nondistended. Bowel sounds are present.  Extremities: Good strength, stability, and range of motion of the upper extremities. Good range of motion of the hips and ankles.  Left Knee: Soft tissue swelling: moderate Effusion: mild Erythema: none Crepitance: moderate Tenderness: medial, lateral Alignment: relative varus Mediolateral laxity: medial pseudolaxity Posterior sag: negative Patellar tracking: Good tracking without evidence of subluxation or tilt Atrophy: No significant atrophy. Quadriceps tone was fair to good. Range of motion: 0/15/93 degrees  Neurologic: Awake, alert, and oriented. Sensory function is intact to pinprick and light touch. Motor strength is judged to be 5/5. Motor coordination is within normal limits. No apparent clonus. No tremor.  X-rays: I ordered and interpreted standing AP, lateral, and sunrise radiographs of the left knee that were obtained in the office  today. There is significant narrowing of the medial cartilage space with bone-on-bone articulation and associated varus alignment. Osteophyte formation is noted.  Subchondral sclerosis is noted. Pseudosubluxation is present.Hardware from the previous ACL reconstruction is in place. No evidence of fracture or dislocation.  Impression: Degenerative arthrosis of the left knee  Plan: Notes from [facility] were reviewed. The findings were discussed in detail with the patient. The patient was given informational material on total knee replacement. Conservative treatment options were reviewed with the patient. We discussed the risks and benefits of surgical intervention. The usual perioperative course was also discussed in detail. The patient expressed understanding of the risks and benefits of surgical intervention and would like to proceed with plans for left total knee arthroplasty.  Hemoglobin A1c is an indication of glucose control. Uncontrolled diabetes has been associated with perioperative complications including poor wound healing and surgical site infections. To decrease the risk of perioperative complications, hemoglobin A1c must be less than 8.0 prior to surgery. The patient is encouraged to work with his primary care physician and/or endocrinologist to optimize glucose management.  Hemoglobin A1c was 6.9 on 01/26/2024.  I spent a total of 60 minutes in both face-to-face and non-face-to-face activities, excluding procedures performed, for this visit on the date of this encounter.  MEDICAL CLEARANCE: Per anesthesiology. ACTIVITY: As tolerated. WORK STATUS: Not applicable. THERAPY: Preoperative physical therapy evaluation. MEDICATIONS: Requested Prescriptions  No prescriptions requested or ordered in this encounter  FOLLOW-UP: Return for preop History & Physical pending surgery date.  Mykiah Schmuck P. Aniket Paye, Jr., M.D.  This note was generated in part with voice recognition software and  I apologize for any typographical errors that were not detected and corrected.

## 2024-07-04 ENCOUNTER — Encounter
Admission: RE | Admit: 2024-07-04 | Discharge: 2024-07-04 | Disposition: A | Discharge status: Home / Self Care | Source: Ambulatory Visit | Attending: Orthopedic Surgery | Admitting: Orthopedic Surgery

## 2024-07-04 ENCOUNTER — Other Ambulatory Visit: Payer: Self-pay

## 2024-07-04 VITALS — BP 149/80 | HR 75 | Resp 16 | Ht 74.0 in | Wt 220.7 lb

## 2024-07-04 DIAGNOSIS — D582 Other hemoglobinopathies: Secondary | ICD-10-CM | POA: Insufficient documentation

## 2024-07-04 DIAGNOSIS — Z01812 Encounter for preprocedural laboratory examination: Secondary | ICD-10-CM

## 2024-07-04 DIAGNOSIS — E119 Type 2 diabetes mellitus without complications: Secondary | ICD-10-CM | POA: Diagnosis not present

## 2024-07-04 DIAGNOSIS — Z01818 Encounter for other preprocedural examination: Secondary | ICD-10-CM | POA: Insufficient documentation

## 2024-07-04 DIAGNOSIS — R319 Hematuria, unspecified: Secondary | ICD-10-CM | POA: Insufficient documentation

## 2024-07-04 DIAGNOSIS — M1712 Unilateral primary osteoarthritis, left knee: Secondary | ICD-10-CM | POA: Diagnosis not present

## 2024-07-04 HISTORY — DX: Retention of urine, unspecified: R33.9

## 2024-07-04 HISTORY — DX: Type 2 diabetes mellitus without complications: E11.9

## 2024-07-04 HISTORY — DX: Obstructive sleep apnea (adult) (pediatric): G47.33

## 2024-07-04 HISTORY — DX: Benign prostatic hyperplasia without lower urinary tract symptoms: N40.0

## 2024-07-04 HISTORY — DX: Hypothyroidism, unspecified: E03.9

## 2024-07-04 HISTORY — DX: Urinary tract infection, site not specified: N39.0

## 2024-07-04 HISTORY — DX: Personal history of urinary calculi: Z87.442

## 2024-07-04 HISTORY — DX: Personal history of other diseases of the circulatory system: Z86.79

## 2024-07-04 HISTORY — DX: Unilateral primary osteoarthritis, left knee: M17.12

## 2024-07-04 HISTORY — DX: Cardiac murmur, unspecified: R01.1

## 2024-07-04 LAB — COMPREHENSIVE METABOLIC PANEL WITH GFR
ALT: 29 U/L (ref 0–44)
AST: 20 U/L (ref 15–41)
Albumin: 4.2 g/dL (ref 3.5–5.0)
Alkaline Phosphatase: 71 U/L (ref 38–126)
Anion gap: 8 (ref 5–15)
BUN: 21 mg/dL (ref 8–23)
CO2: 27 mmol/L (ref 22–32)
Calcium: 9.2 mg/dL (ref 8.9–10.3)
Chloride: 104 mmol/L (ref 98–111)
Creatinine, Ser: 0.76 mg/dL (ref 0.61–1.24)
GFR, Estimated: >60 mL/min (ref >60)
Glucose, Bld: 156 mg/dL — ABNORMAL HIGH (ref 70–99)
Potassium: 3.5 mmol/L (ref 3.5–5.1)
Sodium: 139 mmol/L (ref 135–145)
Total Bilirubin: 0.7 mg/dL (ref 0.0–1.2)
Total Protein: 7 g/dL (ref 6.5–8.1)

## 2024-07-04 LAB — CBC
HCT: 45.7 % (ref 39.0–52.0)
Hemoglobin: 15.4 g/dL (ref 13.0–17.0)
MCH: 29.6 pg (ref 26.0–34.0)
MCHC: 33.7 g/dL (ref 30.0–36.0)
MCV: 87.9 fL (ref 80.0–100.0)
Platelets: 261 K/uL (ref 150–400)
RBC: 5.2 MIL/uL (ref 4.22–5.81)
RDW: 13.5 % (ref 11.5–15.5)
WBC: 6.9 K/uL (ref 4.0–10.5)
nRBC: 0 % (ref 0.0–0.2)

## 2024-07-04 LAB — URINALYSIS, ROUTINE W REFLEX MICROSCOPIC
Bilirubin Urine: NEGATIVE
Glucose, UA: >=500 mg/dL — AB
Hgb urine dipstick: NEGATIVE
Ketones, ur: NEGATIVE mg/dL
Leukocytes,Ua: NEGATIVE
Nitrite: NEGATIVE
Protein, ur: NEGATIVE mg/dL
Specific Gravity, Urine: 1.023 (ref 1.005–1.030)
Squamous Epithelial / HPF: 0 /HPF (ref 0–5)
pH: 5 (ref 5.0–8.0)

## 2024-07-04 LAB — SURGICAL PCR SCREEN
MRSA, PCR: NEGATIVE
Staphylococcus aureus: NEGATIVE

## 2024-07-04 LAB — HEMOGLOBIN A1C
Hgb A1c MFr Bld: 7.1 % — ABNORMAL HIGH (ref 4.8–5.6)
Mean Plasma Glucose: 157 mg/dL

## 2024-07-04 LAB — C-REACTIVE PROTEIN: CRP: 1.3 mg/dL — ABNORMAL HIGH (ref <1.0)

## 2024-07-04 LAB — SEDIMENTATION RATE: Sed Rate: 69 mm/h — ABNORMAL HIGH (ref 0–16)

## 2024-07-04 NOTE — Patient Instructions (Addendum)
 Your procedure is scheduled on: 07/11/24 - Wednesday Report to the Registration Desk on the 1st floor of the [facility]. To find out your arrival time, please call [phone] between 1PM - 3PM on: 07/10/24 - Tuesday If your arrival time is 6:00 am, do not arrive before that time as the [facility] entrance doors do not open until 6:00 am.  REMEMBER: Instructions that are not followed completely may result in serious medical risk, up to and including death; or upon the discretion of your surgeon and anesthesiologist your surgery may need to be rescheduled.  Do not eat food after midnight the night before surgery.  No gum chewing or hard candies.  You may however, drink CLEAR liquids up to 2 hours before you are scheduled to arrive for your surgery. Do not drink anything within 2 hours of your scheduled arrival time.  Clear liquids include: - water    In addition, your doctor has ordered for you to drink the provided:  Gatorade G2 Drinking this carbohydrate drink up to two hours before surgery helps to reduce insulin resistance and improve patient outcomes. Please complete drinking 2 hours before scheduled arrival time.  One week prior to surgery: Stop Anti-inflammatories (NSAIDS) such as Advil, Aleve, Ibuprofen, Motrin, Naproxen, Naprosyn and Aspirin based products such as Excedrin, Goody's Powder, BC Powder. You may continue to take Tylenol  if needed for pain up until the day of surgery.  Stop ANY OVER THE COUNTER supplements until after surgery : D Mannose , Multivitamin, Zinc.  HOLD Metformin  beginning 07/09/24, may resume taking after surgery.  ON THE DAY OF SURGERY ONLY TAKE THESE MEDICATIONS WITH SIPS OF WATER :  levothyroxine  (SYNTHROID )   No Alcohol for 24 hours before or after surgery.  No Smoking including e-cigarettes for 24 hours before surgery.  No chewable tobacco products for at least 6 hours before surgery.  No nicotine patches on the day of surgery.  Do not  use any recreational drugs for at least a week (preferably 2 weeks) before your surgery.  Please be advised that the combination of cocaine and anesthesia may have negative outcomes, up to and including death. If you test positive for cocaine, your surgery will be cancelled.  On the morning of surgery brush your teeth with toothpaste and water , you may rinse your mouth with mouthwash if you wish. Do not swallow any toothpaste or mouthwash.  Use CHG Soap or wipes as directed on instruction sheet.  Do not wear jewelry, make-up, hairpins, clips or nail polish.  For welded (permanent) jewelry: bracelets, anklets, waist bands, etc.  Please have this removed prior to surgery.  If it is not removed, there is a chance that hospital personnel will need to cut it off on the day of surgery.  Do not wear lotions, powders, or perfumes.   Do not shave body hair from the neck down 48 hours before surgery.  Contact lenses, hearing aids and dentures may not be worn into surgery.  Do not bring valuables to the hospital. [facility] is not responsible for any missing/lost belongings or valuables.   Bring your C-PAP to the hospital in case you may have to spend the night.   Notify your doctor if there is any change in your medical condition (cold, fever, infection).  Wear comfortable clothing (specific to your surgery type) to the hospital.  After surgery, you can help prevent lung complications by doing breathing exercises.  Take deep breaths and cough every 1-2 hours. Your doctor may order  a device called an Incentive Spirometer to help you take deep breaths.  When coughing or sneezing, hold a pillow firmly against your incision with both hands. This is called "splinting." Doing this helps protect your incision. It also decreases belly discomfort.  If you are being admitted to the hospital overnight, leave your suitcase in the car. After surgery it may be brought to your room.  In case of increased  patient census, it may be necessary for you, the patient, to continue your postoperative care in the Same Day Surgery department.  If you are being discharged the day of surgery, you will not be allowed to drive home. You will need a responsible individual to drive you home and stay with you for 24 hours after surgery.   If you are taking public transportation, you will need to have a responsible individual with you.  Please call the Pre-admissions Testing Dept. at [phone] if you have any questions about these instructions.  Surgery Visitation Policy:  Patients having surgery or a procedure may have two visitors.  Children under the age of 42 must have an adult with them who is not the patient.  Inpatient Visitation:    Visiting hours are 7 a.m. to 8 p.m. Up to four visitors are allowed at one time in a patient room. The visitors may rotate out with other people during the day.  One visitor age 20 or older may stay with the patient overnight and must be in the room by 8 p.m.   Merchandiser, retail to address health-related social needs:  [email or web address]    Pre-operative 5 CHG Bath Instructions   You can play a key role in reducing the risk of infection after surgery. Your skin needs to be as free of germs as possible. You can reduce the number of germs on your skin by washing with CHG (chlorhexidine  gluconate) soap before surgery. CHG is an antiseptic soap that kills germs and continues to kill germs even after washing.   DO NOT use if you have an allergy to chlorhexidine /CHG or antibacterial soaps. If your skin becomes reddened or irritated, stop using the CHG and notify one of our RNs at [phone].   Please shower with the CHG soap starting 4 days before surgery using the following schedule: 07/12 - 07/16.    Please keep in mind the following:  DO NOT shave, including legs and underarms, starting the day of your first shower.   You may shave  your face at any point before/day of surgery.  Place clean sheets on your bed the day you start using CHG soap. Use a clean washcloth (not used since being washed) for each shower. DO NOT sleep with pets once you start using the CHG.   CHG Shower Instructions:  If you choose to wash your hair and private area, wash first with your normal shampoo/soap.  After you use shampoo/soap, rinse your hair and body thoroughly to remove shampoo/soap residue.  Turn the water  OFF and apply about 3 tablespoons (45 ml) of CHG soap to a CLEAN washcloth.  Apply CHG soap ONLY FROM YOUR NECK DOWN TO YOUR TOES (washing for 3-5 minutes)  DO NOT use CHG soap on face, private areas, open wounds, or sores.  Pay special attention to the area where your surgery is being performed.  If you are having back surgery, having someone wash your back for you may be helpful. Wait 2 minutes after CHG soap is applied, then you may rinse off  the CHG soap.  Pat dry with a clean towel  Put on clean clothes/pajamas   If you choose to wear lotion, please use ONLY the CHG-compatible lotions on the back of this paper.     Additional instructions for the day of surgery: DO NOT APPLY any lotions, deodorants, cologne, or perfumes.   Put on clean/comfortable clothes.  Brush your teeth.  Ask your nurse before applying any prescription medications to the skin.      CHG Compatible Lotions   Aveeno Moisturizing lotion  Cetaphil Moisturizing Cream  Cetaphil Moisturizing Lotion  Clairol Herbal Essence Moisturizing Lotion, Dry Skin  Clairol Herbal Essence Moisturizing Lotion, Extra Dry Skin  Clairol Herbal Essence Moisturizing Lotion, Normal Skin  Curel Age Defying Therapeutic Moisturizing Lotion with Alpha Hydroxy  Curel Extreme Care Body Lotion  Curel Soothing Hands Moisturizing Hand Lotion  Curel Therapeutic Moisturizing Cream, Fragrance-Free  Curel Therapeutic Moisturizing Lotion, Fragrance-Free  Curel Therapeutic Moisturizing  Lotion, Original Formula  Eucerin Daily Replenishing Lotion  Eucerin Dry Skin Therapy Plus Alpha Hydroxy Crme  Eucerin Dry Skin Therapy Plus Alpha Hydroxy Lotion  Eucerin Original Crme  Eucerin Original Lotion  Eucerin Plus Crme Eucerin Plus Lotion  Eucerin TriLipid Replenishing Lotion  Keri Anti-Bacterial Hand Lotion  Keri Deep Conditioning Original Lotion Dry Skin Formula Softly Scented  Keri Deep Conditioning Original Lotion, Fragrance Free Sensitive Skin Formula  Keri Lotion Fast Absorbing Fragrance Free Sensitive Skin Formula  Keri Lotion Fast Absorbing Softly Scented Dry Skin Formula  Keri Original Lotion  Keri Skin Renewal Lotion Keri Silky Smooth Lotion  Keri Silky Smooth Sensitive Skin Lotion  Nivea Body Creamy Conditioning Oil  Nivea Body Extra Enriched Lotion  Nivea Body Original Lotion  Nivea Body Sheer Moisturizing Lotion Nivea Crme  Nivea Skin Firming Lotion  NutraDerm 30 Skin Lotion  NutraDerm Skin Lotion  NutraDerm Therapeutic Skin Cream  NutraDerm Therapeutic Skin Lotion  ProShield Protective Hand Cream  Provon moisturizing lotion  How to Use an Incentive Spirometer  An incentive spirometer is a tool that measures how well you are filling your lungs with each breath. Learning to take long, deep breaths using this tool can help you keep your lungs clear and active. This may help to reverse or lessen your chance of developing breathing (pulmonary) problems, especially infection. You may be asked to use a spirometer: After a surgery. If you have a lung problem or a history of smoking. After a long period of time when you have been unable to move or be active. If the spirometer includes an indicator to show the highest number that you have reached, your health care provider or respiratory therapist will help you set a goal. Keep a log of your progress as told by your health care provider. What are the risks? Breathing too quickly may cause dizziness or cause you  to pass out. Take your time so you do not get dizzy or light-headed. If you are in pain, you may need to take pain medicine before doing incentive spirometry. It is harder to take a deep breath if you are having pain. How to use your incentive spirometer  Sit up on the edge of your bed or on a chair. Hold the incentive spirometer so that it is in an upright position. Before you use the spirometer, breathe out normally. Place the mouthpiece in your mouth. Make sure your lips are closed tightly around it. Breathe in slowly and as deeply as you can through your mouth, causing the piston or  the ball to rise toward the top of the chamber. Hold your breath for 3-5 seconds, or for as long as possible. If the spirometer includes a coach indicator, use this to guide you in breathing. Slow down your breathing if the indicator goes above the marked areas. Remove the mouthpiece from your mouth and breathe out normally. The piston or ball will return to the bottom of the chamber. Rest for a few seconds, then repeat the steps 10 or more times. Take your time and take a few normal breaths between deep breaths so that you do not get dizzy or light-headed. Do this every 1-2 hours when you are awake. If the spirometer includes a goal marker to show the highest number you have reached (best effort), use this as a goal to work toward during each repetition. After each set of 10 deep breaths, cough a few times. This will help to make sure that your lungs are clear. If you have an incision on your chest or abdomen from surgery, place a pillow or a rolled-up towel firmly against the incision when you cough. This can help to reduce pain while taking deep breaths and coughing. General tips When you are able to get out of bed: Walk around often. Continue to take deep breaths and cough in order to clear your lungs. Keep using the incentive spirometer until your health care provider says it is okay to stop using it. If  you have been in the hospital, you may be told to keep using the spirometer at home. Contact a health care provider if: You are having difficulty using the spirometer. You have trouble using the spirometer as often as instructed. Your pain medicine is not giving enough relief for you to use the spirometer as told. You have a fever. Get help right away if: You develop shortness of breath. You develop a cough with bloody mucus from the lungs. You have fluid or blood coming from an incision site after you cough. Summary An incentive spirometer is a tool that can help you learn to take long, deep breaths to keep your lungs clear and active. You may be asked to use a spirometer after a surgery, if you have a lung problem or a history of smoking, or if you have been inactive for a long period of time. Use your incentive spirometer as instructed every 1-2 hours while you are awake. If you have an incision on your chest or abdomen, place a pillow or a rolled-up towel firmly against your incision when you cough. This will help to reduce pain. Get help right away if you have shortness of breath, you cough up bloody mucus, or blood comes from your incision when you cough. This information is not intended to replace advice given to you by your health care provider. Make sure you discuss any questions you have with your health care provider. Document Revised: 03/03/2020 Document Reviewed: 03/03/2020 Elsevier Patient Education  2023 Elsevier Inc.   POLAR CARE INFORMATION  MassAdvertisement.it  How to use [facility] Therapy System?  YouTube   ShippingScam.co.uk  OPERATING INSTRUCTIONS  Start the product With dry hands, connect the transformer to the electrical connection located on the top of the cooler. Next, plug the transformer into an appropriate electrical outlet. The unit will automatically start running at this point.  To stop the pump, disconnect  electrical power.  Unplug to stop the product when not in use. Unplugging the Polar Care unit turns it off.  Always unplug immediately after use. Never leave it plugged in while unattended. Remove pad.    FIRST ADD WATER  TO FILL LINE, THEN ICE---Replace ice when existing ice is almost melted  1 Discuss Treatment with your Licensed Health Care Practitioner and Use Only as Prescribed 2 Apply Insulation Barrier & Cold Therapy Pad 3 Check for Moisture 4 Inspect Skin Regularly  Tips and Trouble Shooting Usage Tips 1. Use cubed or chunked ice for optimal performance. 2. It is recommended to drain the Pad between uses. To drain the pad, hold the Pad upright with the hose pointed toward the ground. Depress the black plunger and allow water  to drain out. 3. You may disconnect the Pad from the unit without removing the pad from the affected area by depressing the silver tabs on the hose coupling and gently pulling the hoses apart. The Pad and unit will seal itself and will not leak. Note: Some dripping during release is normal. 4. DO NOT RUN PUMP WITHOUT WATER ! The pump in this unit is designed to run with water . Running the unit without water  will cause permanent damage to the pump. 5. Unplug unit before removing lid.  TROUBLESHOOTING GUIDE Pump not running, Water  not flowing to the pad, Pad is not getting cold 1. Make sure the transformer is plugged into the wall outlet. 2. Confirm that the ice and water  are filled to the indicated levels. 3. Make sure there are no kinks in the pad. 4. Gently pull on the blue tube to make sure the tube/pad junction is straight. 5. Remove the pad from the treatment site and ll it while the pad is lying at; then reapply. 6. Confirm that the pad couplings are securely attached to the unit. Listen for the double clicks (Figure 1) to confirm the pad couplings are securely attached.  Leaks    Note: Some condensation on the lines, controller, and pads is unavoidable,  especially in warmer climates. 1. If using a [employer] Polar Care Cold Therapy unit with a detachable Cold Therapy Pad, and a leak exists (other than condensation on the lines) disconnect the pad couplings. Make sure the silver tabs on the couplings are depressed before reconnecting the pad to the pump hose; then confirm both sides of the coupling are properly clicked in. 2. If the coupling continues to leak or a leak is detected in the pad itself, stop using it and call [employer] Customer Care at [phone].  Cleaning After use, empty and dry the unit with a soft cloth. Warm water  and mild detergent may be used occasionally to clean the pump and tubes.  WARNING: The Polar Care Cube can be cold enough to cause serious injury, including full skin necrosis. Follow these Operating Instructions, and carefully read the Product Insert (see pouch on side of unit) and the Cold Therapy Pad Fitting Instructions (provided with each Cold Therapy Pad) prior to use.  Preoperative Educational Videos for Total Hip, Knee and Shoulder Replacements  To better prepare for surgery, please view our videos that explain the physical activity and discharge planning required to have the best surgical recovery at [facility].  [email or web address]  Questions? Call [phone] or email [email or web address]

## 2024-07-06 ENCOUNTER — Ambulatory Visit: Payer: Self-pay | Admitting: Urgent Care

## 2024-07-06 NOTE — Progress Notes (Signed)
   [facility] Perioperative Services: Pre-Admission/Anesthesia Testing  Abnormal Lab Notification   Date: 07/06/24  Name: Alan Holt MRN:   [id number]  Re: Abnormal labs noted during PAT appointment   Notified:  Provider Name Provider Role Notification Mode  Mardee Agent, MD Orthopedics (Surgeon) Routed and/or faxed via [facility]   Abnormal Lab Value(s):   Lab Results  Component Value Date   COLORURINE YELLOW (A) 07/04/2024   APPEARANCEUR HAZY (A) 07/04/2024   LABSPEC 1.023 07/04/2024   PHURINE 5.0 07/04/2024   GLUCOSEU >=500 (A) 07/04/2024   HGBUR NEGATIVE 07/04/2024   BILIRUBINUR NEGATIVE 07/04/2024   KETONESUR NEGATIVE 07/04/2024   PROTEINUR NEGATIVE 07/04/2024   UROBILINOGEN 0.2 10/26/2021   NITRITE NEGATIVE 07/04/2024   LEUKOCYTESUR NEGATIVE 07/04/2024   EPIU 0 07/04/2024   WBCU 11-20 07/04/2024   RBCU 0-5 07/04/2024   BACTERIA MANY (A) 07/04/2024   CULT (A) 07/04/2024    70,000 COLONIES/mL GRAM NEGATIVE RODS CULTURE REINCUBATED FOR BETTER GROWTH Performed at [facility], [address], [place], [place] [address]     Clinical Information and Notes:  Patient is scheduled for ARTHROPLASTY, KNEE, TOTAL, USING IMAGELESS COMPUTER-ASSISTED NAVIGATION (Left: Knee) on 07/11/2024.    UA performed in PAT consistent with/concerning for infection.  No leukocytosis noted on CBC; WBC 6.9 Renal function: Estimated Creatinine Clearance: 102.4 mL/min (by C-G formula based on SCr of 0.76 mg/dL). Urine C&S added to assess for pathogenically significant growth.  Impression and Plan:  Alan Holt with a UA that was (+) for infection; reflex culture sent. Preliminary culture (+) for significant GNR colony count; final pathogen ID and susceptibilities pending.   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   Patient has a history of fairly recent  (01/2024-02/2024) complicated urinary tract infection requiring treatment with multiple antimicrobial courses, including: amoxicillin-clavulanate, cefdinir, and SMZ-TMP DS.   He is under the care of urology, with providers here at [facility] [place], MD) and at Madie Car, MD and [place], [place]), however he is mainly seeing the [facility] providers at this point (LOV 09/26/2023; NOV 09/26/2024).   Will plan on forwarding final culture results to MD as they become available to me. Sending results for review and consideration of preoperative treatment as deemed appropriate by Dr. Hooten. No further needs from the PAT department identified at this time.  Dorise Pereyra, MSN, APRN, FNP-C, CEN [facility]  Perioperative Services Nurse Practitioner Phone: [phone] Fax: [phone] 07/06/24 7:53 AM  NOTE: This note has been prepared using Dragon dictation software. Despite my best ability to proofread, there is always the potential that unintentional transcriptional errors may still occur from this process.

## 2024-07-07 LAB — URINE CULTURE: Culture: 70000 — AB

## 2024-07-11 ENCOUNTER — Observation Stay
Admission: RE | Admit: 2024-07-11 | Discharge: 2024-07-13 | Disposition: A | Discharge status: Home / Self Care | Source: Ambulatory Visit | Attending: Orthopedic Surgery | Admitting: Orthopedic Surgery

## 2024-07-11 ENCOUNTER — Other Ambulatory Visit: Payer: Self-pay

## 2024-07-11 ENCOUNTER — Encounter
Admission: RE | Disposition: A | Discharge status: Home / Self Care | Payer: Self-pay | Source: Ambulatory Visit | Attending: Orthopedic Surgery

## 2024-07-11 ENCOUNTER — Observation Stay

## 2024-07-11 ENCOUNTER — Ambulatory Visit: Payer: Self-pay | Admitting: Urgent Care

## 2024-07-11 ENCOUNTER — Encounter: Payer: Self-pay | Admitting: Orthopedic Surgery

## 2024-07-11 DIAGNOSIS — Z96652 Presence of left artificial knee joint: Secondary | ICD-10-CM | POA: Diagnosis not present

## 2024-07-11 DIAGNOSIS — E119 Type 2 diabetes mellitus without complications: Secondary | ICD-10-CM | POA: Insufficient documentation

## 2024-07-11 DIAGNOSIS — R319 Hematuria, unspecified: Secondary | ICD-10-CM

## 2024-07-11 DIAGNOSIS — R8271 Bacteriuria: Secondary | ICD-10-CM

## 2024-07-11 DIAGNOSIS — F17200 Nicotine dependence, unspecified, uncomplicated: Secondary | ICD-10-CM | POA: Insufficient documentation

## 2024-07-11 DIAGNOSIS — D582 Other hemoglobinopathies: Secondary | ICD-10-CM

## 2024-07-11 DIAGNOSIS — M1712 Unilateral primary osteoarthritis, left knee: Secondary | ICD-10-CM | POA: Diagnosis present

## 2024-07-11 DIAGNOSIS — R8290 Unspecified abnormal findings in urine: Secondary | ICD-10-CM

## 2024-07-11 DIAGNOSIS — Z7982 Long term (current) use of aspirin: Secondary | ICD-10-CM | POA: Diagnosis not present

## 2024-07-11 DIAGNOSIS — Z01812 Encounter for preprocedural laboratory examination: Secondary | ICD-10-CM

## 2024-07-11 HISTORY — PX: HARDWARE REMOVAL: SHX979

## 2024-07-11 HISTORY — PX: KNEE ARTHROPLASTY: SHX992

## 2024-07-11 LAB — GLUCOSE, CAPILLARY
Glucose-Capillary: 158 mg/dL — ABNORMAL HIGH (ref 70–99)
Glucose-Capillary: 225 mg/dL — ABNORMAL HIGH (ref 70–99)
Glucose-Capillary: 346 mg/dL — ABNORMAL HIGH (ref 70–99)

## 2024-07-11 SURGERY — ARTHROPLASTY, KNEE, TOTAL, USING IMAGELESS COMPUTER-ASSISTED NAVIGATION
Anesthesia: Spinal | Site: Knee | Laterality: Left

## 2024-07-11 MED ORDER — GABAPENTIN 300 MG PO CAPS
300.0000 mg | ORAL_CAPSULE | Freq: Once | ORAL | Status: AC
  Administered 2024-07-11: 300 mg via ORAL

## 2024-07-11 MED ORDER — FENTANYL CITRATE (PF) 100 MCG/2ML IJ SOLN
25.0000 ug | INTRAMUSCULAR | Status: DC | PRN
  Administered 2024-07-11 (×2): 25 ug via INTRAVENOUS
  Administered 2024-07-11: 50 ug via INTRAVENOUS
  Administered 2024-07-11 (×2): 25 ug via INTRAVENOUS

## 2024-07-11 MED ORDER — DEXAMETHASONE SODIUM PHOSPHATE 10 MG/ML IJ SOLN
INTRAMUSCULAR | Status: AC
  Filled 2024-07-11: qty 1

## 2024-07-11 MED ORDER — PHENYLEPHRINE HCL-NACL 20-0.9 MG/250ML-% IV SOLN
INTRAVENOUS | Status: AC
  Filled 2024-07-11: qty 250

## 2024-07-11 MED ORDER — PHENOL 1.4 % MT LIQD: 1.0000 | OROMUCOSAL | Status: DC | PRN

## 2024-07-11 MED ORDER — PROPOFOL 500 MG/50ML IV EMUL
INTRAVENOUS | Status: DC | PRN
  Administered 2024-07-11: 80 ug/kg/min via INTRAVENOUS
  Administered 2024-07-11: 20 mg via INTRAVENOUS

## 2024-07-11 MED ORDER — SODIUM CHLORIDE 0.9 % IR SOLN
Status: DC | PRN
  Administered 2024-07-11: 3000 mL

## 2024-07-11 MED ORDER — FINASTERIDE 5 MG PO TABS
5.0000 mg | ORAL_TABLET | Freq: Every day | ORAL | Status: DC
  Administered 2024-07-12 – 2024-07-13 (×2): 5 mg via ORAL
  Filled 2024-07-11 (×2): qty 1

## 2024-07-11 MED ORDER — OXYCODONE HCL 5 MG PO TABS
10.0000 mg | ORAL_TABLET | ORAL | Status: DC | PRN
  Administered 2024-07-12: 10 mg via ORAL
  Filled 2024-07-11: qty 2

## 2024-07-11 MED ORDER — MAGNESIUM HYDROXIDE 400 MG/5ML PO SUSP
30.0000 mL | Freq: Every day | ORAL | Status: DC
  Administered 2024-07-12 – 2024-07-13 (×2): 30 mL via ORAL
  Filled 2024-07-11 (×2): qty 30

## 2024-07-11 MED ORDER — MENTHOL 3 MG MT LOZG: 1.0000 | LOZENGE | OROMUCOSAL | Status: DC | PRN

## 2024-07-11 MED ORDER — METFORMIN HCL 500 MG PO TABS
500.0000 mg | ORAL_TABLET | Freq: Two times a day (BID) | ORAL | Status: DC
  Administered 2024-07-12 – 2024-07-13 (×3): 500 mg via ORAL
  Filled 2024-07-11: qty 1

## 2024-07-11 MED ORDER — OXYCODONE HCL 5 MG PO TABS
5.0000 mg | ORAL_TABLET | Freq: Once | ORAL | Status: AC | PRN
  Administered 2024-07-11: 5 mg via ORAL

## 2024-07-11 MED ORDER — CHLORHEXIDINE GLUCONATE 0.12 % MT SOLN
OROMUCOSAL | Status: AC
Start: 2024-07-11 — End: 2024-07-11
  Filled 2024-07-11: qty 15

## 2024-07-11 MED ORDER — PANTOPRAZOLE SODIUM 40 MG PO TBEC
40.0000 mg | DELAYED_RELEASE_TABLET | Freq: Two times a day (BID) | ORAL | Status: DC
  Administered 2024-07-11 – 2024-07-13 (×4): 40 mg via ORAL
  Filled 2024-07-11 (×4): qty 1

## 2024-07-11 MED ORDER — CHLORHEXIDINE GLUCONATE 4 % EX SOLN: 60.0000 mL | Freq: Once | CUTANEOUS | Status: DC

## 2024-07-11 MED ORDER — PROPOFOL 1000 MG/100ML IV EMUL
INTRAVENOUS | Status: AC
  Filled 2024-07-11: qty 100

## 2024-07-11 MED ORDER — ONDANSETRON HCL 4 MG PO TABS: 4.0000 mg | ORAL_TABLET | Freq: Four times a day (QID) | ORAL | Status: DC | PRN

## 2024-07-11 MED ORDER — ACETAMINOPHEN 10 MG/ML IV SOLN
INTRAVENOUS | Status: DC | PRN
  Administered 2024-07-11: 1000 mg via INTRAVENOUS

## 2024-07-11 MED ORDER — TRANEXAMIC ACID-NACL 1000-0.7 MG/100ML-% IV SOLN
1000.0000 mg | Freq: Once | INTRAVENOUS | Status: AC
  Administered 2024-07-11: 1000 mg via INTRAVENOUS

## 2024-07-11 MED ORDER — ONDANSETRON HCL 4 MG/2ML IJ SOLN: 4.0000 mg | Freq: Four times a day (QID) | INTRAMUSCULAR | Status: DC | PRN

## 2024-07-11 MED ORDER — CELECOXIB 200 MG PO CAPS
200.0000 mg | ORAL_CAPSULE | Freq: Two times a day (BID) | ORAL | Status: DC
  Administered 2024-07-11 – 2024-07-13 (×4): 200 mg via ORAL
  Filled 2024-07-11 (×4): qty 1

## 2024-07-11 MED ORDER — ACETAMINOPHEN 325 MG PO TABS: 325.0000 mg | ORAL_TABLET | Freq: Four times a day (QID) | ORAL | Status: DC | PRN

## 2024-07-11 MED ORDER — TRANEXAMIC ACID-NACL 1000-0.7 MG/100ML-% IV SOLN
INTRAVENOUS | Status: AC
  Filled 2024-07-11: qty 100

## 2024-07-11 MED ORDER — BUPIVACAINE HCL (PF) 0.5 % IJ SOLN
INTRAMUSCULAR | Status: AC
  Filled 2024-07-11: qty 10

## 2024-07-11 MED ORDER — MIDAZOLAM HCL 5 MG/5ML IJ SOLN
INTRAMUSCULAR | Status: DC | PRN
  Administered 2024-07-11: 2 mg via INTRAVENOUS

## 2024-07-11 MED ORDER — SODIUM CHLORIDE 0.9 % IV SOLN: INTRAVENOUS | Status: DC

## 2024-07-11 MED ORDER — EPHEDRINE SULFATE-NACL 50-0.9 MG/10ML-% IV SOSY
PREFILLED_SYRINGE | INTRAVENOUS | Status: DC | PRN
  Administered 2024-07-11 (×2): 5 mg via INTRAVENOUS

## 2024-07-11 MED ORDER — CEFAZOLIN SODIUM-DEXTROSE 2-4 GM/100ML-% IV SOLN
2.0000 g | Freq: Four times a day (QID) | INTRAVENOUS | Status: AC
  Administered 2024-07-11 – 2024-07-12 (×2): 2 g via INTRAVENOUS
  Filled 2024-07-11 (×2): qty 100

## 2024-07-11 MED ORDER — FLEET ENEMA RE ENEM: 1.0000 | ENEMA | Freq: Once | RECTAL | Status: DC | PRN

## 2024-07-11 MED ORDER — FENTANYL CITRATE (PF) 100 MCG/2ML IJ SOLN
INTRAMUSCULAR | Status: AC
  Filled 2024-07-11: qty 2

## 2024-07-11 MED ORDER — BUPIVACAINE HCL (PF) 0.5 % IJ SOLN
INTRAMUSCULAR | Status: DC | PRN
Start: 2024-07-11 — End: 2024-07-11
  Administered 2024-07-11: 3 mL

## 2024-07-11 MED ORDER — GABAPENTIN 300 MG PO CAPS
ORAL_CAPSULE | ORAL | Status: AC
  Filled 2024-07-11: qty 1

## 2024-07-11 MED ORDER — ACETAMINOPHEN 10 MG/ML IV SOLN: 1000.0000 mg | Freq: Once | INTRAVENOUS | Status: DC | PRN

## 2024-07-11 MED ORDER — ACETAMINOPHEN 10 MG/ML IV SOLN
INTRAVENOUS | Status: AC
  Filled 2024-07-11: qty 100

## 2024-07-11 MED ORDER — PHENYLEPHRINE HCL-NACL 20-0.9 MG/250ML-% IV SOLN
INTRAVENOUS | Status: DC | PRN
  Administered 2024-07-11: 10 ug/min via INTRAVENOUS

## 2024-07-11 MED ORDER — TAMSULOSIN HCL 0.4 MG PO CAPS
0.4000 mg | ORAL_CAPSULE | Freq: Every day | ORAL | Status: DC
  Administered 2024-07-12 – 2024-07-13 (×2): 0.4 mg via ORAL
  Filled 2024-07-11 (×2): qty 1

## 2024-07-11 MED ORDER — OXYCODONE HCL 5 MG PO TABS
5.0000 mg | ORAL_TABLET | ORAL | Status: DC | PRN
  Administered 2024-07-11 – 2024-07-13 (×3): 5 mg via ORAL
  Filled 2024-07-11 (×3): qty 1

## 2024-07-11 MED ORDER — EZETIMIBE 10 MG PO TABS
10.0000 mg | ORAL_TABLET | Freq: Every day | ORAL | Status: DC
  Filled 2024-07-11: qty 1

## 2024-07-11 MED ORDER — ONDANSETRON HCL 4 MG/2ML IJ SOLN: 4.0000 mg | Freq: Once | INTRAMUSCULAR | Status: DC | PRN

## 2024-07-11 MED ORDER — SENNOSIDES-DOCUSATE SODIUM 8.6-50 MG PO TABS
1.0000 | ORAL_TABLET | Freq: Two times a day (BID) | ORAL | Status: DC
  Administered 2024-07-11 – 2024-07-13 (×4): 1 via ORAL
  Filled 2024-07-11 (×4): qty 1

## 2024-07-11 MED ORDER — ORAL CARE MOUTH RINSE: 15.0000 mL | Freq: Once | OROMUCOSAL | Status: AC

## 2024-07-11 MED ORDER — CEFAZOLIN SODIUM-DEXTROSE 2-4 GM/100ML-% IV SOLN
2.0000 g | INTRAVENOUS | Status: AC
  Administered 2024-07-11: 2 g via INTRAVENOUS

## 2024-07-11 MED ORDER — TRANEXAMIC ACID-NACL 1000-0.7 MG/100ML-% IV SOLN
1000.0000 mg | INTRAVENOUS | Status: AC
  Administered 2024-07-11: 1000 mg via INTRAVENOUS

## 2024-07-11 MED ORDER — CELECOXIB 200 MG PO CAPS
400.0000 mg | ORAL_CAPSULE | Freq: Once | ORAL | Status: AC
  Administered 2024-07-11: 400 mg via ORAL

## 2024-07-11 MED ORDER — LACTATED RINGERS IV SOLN: INTRAVENOUS | Status: DC

## 2024-07-11 MED ORDER — METOCLOPRAMIDE HCL 10 MG PO TABS
10.0000 mg | ORAL_TABLET | Freq: Three times a day (TID) | ORAL | Status: DC
  Administered 2024-07-11 – 2024-07-13 (×7): 10 mg via ORAL
  Filled 2024-07-11 (×7): qty 1

## 2024-07-11 MED ORDER — LEVOTHYROXINE SODIUM 25 MCG PO TABS
150.0000 ug | ORAL_TABLET | Freq: Every day | ORAL | Status: DC
  Administered 2024-07-12 – 2024-07-13 (×2): 150 ug via ORAL
  Filled 2024-07-11 (×2): qty 6

## 2024-07-11 MED ORDER — ALUM & MAG HYDROXIDE-SIMETH 200-200-20 MG/5ML PO SUSP
30.0000 mL | ORAL | Status: DC | PRN
Start: 2024-07-11 — End: 2024-07-13

## 2024-07-11 MED ORDER — DIPHENHYDRAMINE HCL 12.5 MG/5ML PO ELIX: 12.5000 mg | ORAL_SOLUTION | ORAL | Status: DC | PRN

## 2024-07-11 MED ORDER — INSULIN ASPART 100 UNIT/ML IJ SOLN
0.0000 [IU] | Freq: Every day | INTRAMUSCULAR | Status: DC
  Administered 2024-07-11: 5 [IU] via SUBCUTANEOUS
  Filled 2024-07-11: qty 1

## 2024-07-11 MED ORDER — DEXAMETHASONE SODIUM PHOSPHATE 10 MG/ML IJ SOLN
8.0000 mg | Freq: Once | INTRAMUSCULAR | Status: AC
  Administered 2024-07-11: 8 mg via INTRAVENOUS

## 2024-07-11 MED ORDER — BISACODYL 10 MG RE SUPP: 10.0000 mg | Freq: Every day | RECTAL | Status: DC | PRN

## 2024-07-11 MED ORDER — SODIUM CHLORIDE (PF) 0.9 % IJ SOLN
INTRAMUSCULAR | Status: DC | PRN
  Administered 2024-07-11: 120 mL via INTRAMUSCULAR

## 2024-07-11 MED ORDER — ASPIRIN 81 MG PO CHEW
81.0000 mg | CHEWABLE_TABLET | Freq: Two times a day (BID) | ORAL | Status: DC
  Administered 2024-07-11 – 2024-07-13 (×4): 81 mg via ORAL
  Filled 2024-07-11 (×4): qty 1

## 2024-07-11 MED ORDER — MIDAZOLAM HCL 2 MG/2ML IJ SOLN
INTRAMUSCULAR | Status: AC
Start: 2024-07-11 — End: 2024-07-11
  Filled 2024-07-11: qty 2

## 2024-07-11 MED ORDER — SURGIPHOR WOUND IRRIGATION SYSTEM - OPTIME: TOPICAL | Status: DC | PRN

## 2024-07-11 MED ORDER — OXYCODONE HCL 5 MG PO TABS
ORAL_TABLET | ORAL | Status: AC
  Filled 2024-07-11: qty 1

## 2024-07-11 MED ORDER — TRAMADOL HCL 50 MG PO TABS: 50.0000 mg | ORAL_TABLET | ORAL | Status: DC | PRN

## 2024-07-11 MED ORDER — HYDROMORPHONE HCL 1 MG/ML IJ SOLN: 0.5000 mg | INTRAMUSCULAR | Status: DC | PRN

## 2024-07-11 MED ORDER — OXYCODONE HCL 5 MG/5ML PO SOLN: 5.0000 mg | Freq: Once | ORAL | Status: AC | PRN

## 2024-07-11 MED ORDER — ACETAMINOPHEN 10 MG/ML IV SOLN
1000.0000 mg | Freq: Four times a day (QID) | INTRAVENOUS | Status: AC
  Administered 2024-07-11 – 2024-07-12 (×4): 1000 mg via INTRAVENOUS
  Filled 2024-07-11 (×3): qty 100

## 2024-07-11 MED ORDER — CELECOXIB 200 MG PO CAPS
ORAL_CAPSULE | ORAL | Status: AC
  Filled 2024-07-11: qty 2

## 2024-07-11 MED ORDER — CEFAZOLIN SODIUM-DEXTROSE 2-4 GM/100ML-% IV SOLN
INTRAVENOUS | Status: AC
  Filled 2024-07-11: qty 100

## 2024-07-11 MED ORDER — INSULIN ASPART 100 UNIT/ML IJ SOLN
0.0000 [IU] | Freq: Three times a day (TID) | INTRAMUSCULAR | Status: DC
  Administered 2024-07-12: 4 [IU] via SUBCUTANEOUS
  Administered 2024-07-12 (×2): 5 [IU] via SUBCUTANEOUS
  Administered 2024-07-13: 3 [IU] via SUBCUTANEOUS
  Administered 2024-07-13: 5 [IU] via SUBCUTANEOUS
  Filled 2024-07-11 (×5): qty 1

## 2024-07-11 MED ORDER — CHLORHEXIDINE GLUCONATE 0.12 % MT SOLN
15.0000 mL | Freq: Once | OROMUCOSAL | Status: AC
  Administered 2024-07-11: 15 mL via OROMUCOSAL

## 2024-07-11 MED ORDER — FERROUS SULFATE 325 (65 FE) MG PO TABS
325.0000 mg | ORAL_TABLET | Freq: Two times a day (BID) | ORAL | Status: DC
  Administered 2024-07-12 – 2024-07-13 (×3): 325 mg via ORAL
  Filled 2024-07-11 (×3): qty 1

## 2024-07-11 MED ORDER — FENTANYL CITRATE (PF) 100 MCG/2ML IJ SOLN
INTRAMUSCULAR | Status: AC
Start: 2024-07-11 — End: 2024-07-11
  Filled 2024-07-11: qty 2

## 2024-07-11 SURGICAL SUPPLY — 63 items
ATTUNE MED DOME PAT 41 KNEE (Knees) IMPLANT
ATTUNE PS FEM LT SZ 8 CEM KNEE (Femur) IMPLANT
ATTUNE PSRP INSR SZ8 6 KNEE (Insert) IMPLANT
BASE TIBIAL ATTUNE KNEE SZ9 (Knees) IMPLANT
BATTERY INSTRU NAVIGATION (MISCELLANEOUS) ×4 IMPLANT
BIT DRILL QUICK REL 1/8 2PK SL (BIT) ×1 IMPLANT
BLADE CLIPPER SURG (BLADE) IMPLANT
BLADE SAW 70X12.5 (BLADE) ×1 IMPLANT
BLADE SAW 90X13X1.19 OSCILLAT (BLADE) ×1 IMPLANT
BLADE SAW 90X25X1.19 OSCILLAT (BLADE) ×1 IMPLANT
BRUSH SCRUB EZ PLAIN DRY (MISCELLANEOUS) ×1 IMPLANT
CEMENT BONE GENTAMICIN 40 (Cement) IMPLANT
COOLER ICEMAN CLASSIC (MISCELLANEOUS) ×1 IMPLANT
CUFF TRNQT CYL 24X4X16.5-23 (TOURNIQUET CUFF) IMPLANT
CUFF TRNQT CYL 30X4X21-28X (TOURNIQUET CUFF) IMPLANT
DRAPE SHEET LG 3/4 BI-LAMINATE (DRAPES) ×1 IMPLANT
DRSG AQUACEL AG ADV 3.5X14 (GAUZE/BANDAGES/DRESSINGS) ×1 IMPLANT
DRSG MEPILEX SACRM 8.7X9.8 (GAUZE/BANDAGES/DRESSINGS) ×1 IMPLANT
DRSG TEGADERM 4X4.75 (GAUZE/BANDAGES/DRESSINGS) ×1 IMPLANT
DURAPREP 26ML APPLICATOR (WOUND CARE) ×2 IMPLANT
ELECT CAUTERY BLADE 6.4 (BLADE) ×1 IMPLANT
ELECTRODE REM PT RTRN 9FT ADLT (ELECTROSURGICAL) ×1 IMPLANT
EVACUATOR 1/8 PVC DRAIN (DRAIN) ×1 IMPLANT
EX-PIN ORTHOLOCK NAV 4X150 (PIN) ×2 IMPLANT
GAUZE XEROFORM 1X8 LF (GAUZE/BANDAGES/DRESSINGS) ×1 IMPLANT
GLOVE BIOGEL M STRL SZ7.5 (GLOVE) ×6 IMPLANT
GLOVE BIOGEL PI IND STRL 8 (GLOVE) ×1 IMPLANT
GLOVE SRG 8 PF TXTR STRL LF DI (GLOVE) ×1 IMPLANT
GOWN STRL REUS W/ TWL LRG LVL3 (GOWN DISPOSABLE) ×1 IMPLANT
GOWN STRL REUS W/ TWL XL LVL3 (GOWN DISPOSABLE) ×1 IMPLANT
GOWN TOGA ZIPPER T7+ PEEL AWAY (MISCELLANEOUS) ×1 IMPLANT
HOLDER FOLEY CATH W/STRAP (MISCELLANEOUS) ×1 IMPLANT
HOOD PEEL AWAY T7 (MISCELLANEOUS) ×1 IMPLANT
KIT TURNOVER KIT A (KITS) ×1 IMPLANT
KNIFE SCULPS 14X20 (INSTRUMENTS) ×1 IMPLANT
MANIFOLD NEPTUNE II (INSTRUMENTS) ×2 IMPLANT
NDL SPNL 20GX3.5 QUINCKE YW (NEEDLE) ×2 IMPLANT
NEEDLE SPNL 20GX3.5 QUINCKE YW (NEEDLE) ×2 IMPLANT
PACK TOTAL KNEE (MISCELLANEOUS) ×1 IMPLANT
PAD ABD DERMACEA PRESS 5X9 (GAUZE/BANDAGES/DRESSINGS) ×2 IMPLANT
PAD ARMBOARD POSITIONER FOAM (MISCELLANEOUS) ×3 IMPLANT
PAD COLD UNI WRAP-ON (PAD) ×1 IMPLANT
PENCIL SMOKE EVACUATOR COATED (MISCELLANEOUS) ×1 IMPLANT
PIN DRILL FIX HALF THREAD (BIT) ×2 IMPLANT
PIN FIXATION 1/8DIA X 3INL (PIN) ×1 IMPLANT
SOL .9 NS 3000ML IRR UROMATIC (IV SOLUTION) ×1 IMPLANT
SOLUTION IRRIG SURGIPHOR (IV SOLUTION) ×1 IMPLANT
SPONGE DRAIN TRACH 4X4 STRL 2S (GAUZE/BANDAGES/DRESSINGS) ×1 IMPLANT
STAPLER SKIN PROX 35W (STAPLE) ×1 IMPLANT
STOCKINETTE IMPERV 14X48 (MISCELLANEOUS) ×1 IMPLANT
STOCKINETTE STRL BIAS CUT 8X4 (MISCELLANEOUS) ×1 IMPLANT
STRAP TIBIA SHORT (MISCELLANEOUS) ×1 IMPLANT
SUCTION TUBE FRAZIER 10FR DISP (SUCTIONS) ×1 IMPLANT
SUT VIC AB 0 CT1 36 (SUTURE) ×1 IMPLANT
SUT VIC AB 1 CT1 36 (SUTURE) ×2 IMPLANT
SUT VIC AB 2-0 CT2 27 (SUTURE) ×1 IMPLANT
SYR 30ML LL (SYRINGE) ×2 IMPLANT
TIP FAN IRRIG PULSAVAC PLUS (DISPOSABLE) ×1 IMPLANT
TOWEL OR 17X26 4PK STRL BLUE (TOWEL DISPOSABLE) IMPLANT
TOWER CARTRIDGE SMART MIX (DISPOSABLE) ×1 IMPLANT
TRAP FLUID SMOKE EVACUATOR (MISCELLANEOUS) ×1 IMPLANT
TRAY FOLEY MTR SLVR 16FR STAT (SET/KITS/TRAYS/PACK) ×1 IMPLANT
WATER STERILE IRR 1000ML POUR (IV SOLUTION) ×1 IMPLANT

## 2024-07-11 NOTE — Interval H&P Note (Signed)
 History and Physical Interval Note:  07/11/2024 11:15 AM  Alan Holt [place]  has presented today for surgery, with the diagnosis of Primary osteoarthritis of left knee.  The various methods of treatment have been discussed with the patient and family. After consideration of risks, benefits and other options for treatment, the patient has consented to  Procedure(s): ARTHROPLASTY, KNEE, TOTAL, USING IMAGELESS COMPUTER-ASSISTED NAVIGATION (Left) as a surgical intervention.  The patient's history has been reviewed, patient examined, no change in status, stable for surgery.  I have reviewed the patient's chart and labs.  Questions were answered to the patient's satisfaction.     Sukanya Goldblatt P Jaimie Redditt

## 2024-07-11 NOTE — Progress Notes (Signed)
 PT Cancellation Note  Patient Details Name: Alan Holt MRN: [id number] DOB: June 08, 1951   Cancelled Treatment:    Reason Eval/Treat Not Completed: Patient not medically ready Spoke with PACU nurse ~1700, pt only just arriving and not yet ready for PT.  Will initiate PT POD1 as appropriate.  [name] 07/11/2024, 5:01 PM

## 2024-07-11 NOTE — Anesthesia Postprocedure Evaluation (Signed)
 Anesthesia Post Note  Patient: Alan Holt  Procedure(s) Performed: ARTHROPLASTY, KNEE, TOTAL, USING IMAGELESS COMPUTER-ASSISTED NAVIGATION (Left: Knee) REMOVAL, HARDWARE (Left: Knee)  Patient location during evaluation: PACU Anesthesia Type: Spinal Level of consciousness: awake and alert Pain management: pain level controlled Vital Signs Assessment: post-procedure vital signs reviewed and stable Respiratory status: spontaneous breathing, nonlabored ventilation, respiratory function stable and patient connected to nasal cannula oxygen Cardiovascular status: blood pressure returned to baseline and stable Postop Assessment: no apparent nausea or vomiting Anesthetic complications: no   There were no known notable events for this encounter.   Last Vitals:  Vitals:   07/11/24 1850 07/11/24 2013  BP: (!) 143/89 139/76  Pulse: 81 76  Resp: 15 18  Temp: 36.6 C (!) 36.4 C  SpO2: 95% 96%    Last Pain:  Vitals:   07/11/24 2030  TempSrc:   PainSc: 7                  [name]

## 2024-07-11 NOTE — Anesthesia Preprocedure Evaluation (Addendum)
 Anesthesia Evaluation  Patient identified by MRN, date of birth, ID band Patient awake    Reviewed: Allergy & Precautions, NPO status , Patient's Chart, lab work & pertinent test results  History of Anesthesia Complications Negative for: history of anesthetic complications  Airway Mallampati: I   Neck ROM: Full    Dental  (+) Implants   Pulmonary sleep apnea and Continuous Positive Airway Pressure Ventilation    Pulmonary exam normal breath sounds clear to auscultation       Cardiovascular hypertension, Normal cardiovascular exam Rhythm:Regular Rate:Normal  ECG 07/04/24:  Sinus bradycardia with Premature supraventricular complexes Nonspecific ST and T wave abnormality   Neuro/Psych negative neurological ROS     GI/Hepatic negative GI ROS,,,  Endo/Other  diabetes, Type 2Hypothyroidism    Renal/GU Renal disease (nephrolithiasis)   BPH    Musculoskeletal  (+) Arthritis ,    Abdominal   Peds  Hematology negative hematology ROS (+)   Anesthesia Other Findings   Reproductive/Obstetrics                              Anesthesia Physical Anesthesia Plan  ASA: 2  Anesthesia Plan: Spinal   Post-op Pain Management:    Induction: Intravenous  PONV Risk Score and Plan: 2 and Propofol  infusion, TIVA, Treatment may vary due to age or medical condition and Ondansetron   Airway Management Planned: Natural Airway and Nasal Cannula  Additional Equipment:   Intra-op Plan:   Post-operative Plan:   Informed Consent: I have reviewed the patients History and Physical, chart, labs and discussed the procedure including the risks, benefits and alternatives for the proposed anesthesia with the patient or authorized representative who has indicated his/her understanding and acceptance.       Plan Discussed with: CRNA  Anesthesia Plan Comments: (Plan for spinal and GA with natural airway, LMA/GETA  backup.  Patient consented for risks of anesthesia including but not limited to:  - adverse reactions to medications - damage to eyes, teeth, lips or other oral mucosa - nerve damage due to positioning  - sore throat or hoarseness - headache, bleeding, infection, nerve damage 2/2 spinal - damage to heart, brain, nerves, lungs, other parts of body or loss of life  Informed patient about role of CRNA in peri- and intra-operative care.  Patient voiced understanding.)         Anesthesia Quick Evaluation

## 2024-07-11 NOTE — Transfer of Care (Signed)
 Immediate Anesthesia Transfer of Care Note  Patient: Alan Holt  Procedure(s) Performed: ARTHROPLASTY, KNEE, TOTAL, USING IMAGELESS COMPUTER-ASSISTED NAVIGATION (Left: Knee) REMOVAL, HARDWARE (Left: Knee)  Patient Location: PACU  Anesthesia Type:General  Level of Consciousness: drowsy and patient cooperative  Airway & Oxygen Therapy: Patient Spontanous Breathing and Patient connected to face mask oxygen  Post-op Assessment: Report given to RN and Post -op Vital signs reviewed and stable  Post vital signs: stable  Last Vitals:  Vitals Value Taken Time  BP    Temp    Pulse    Resp    SpO2      Last Pain:  Vitals:   07/11/24 1022  TempSrc: Temporal  PainSc: 0-No pain         Complications: No notable events documented.

## 2024-07-11 NOTE — Op Note (Signed)
 OPERATIVE NOTE  DATE OF SURGERY:  07/11/2024  PATIENT NAME:  Alan Holt   DOB: 1951/09/28  MRN: [id number]  PRE-OPERATIVE DIAGNOSIS: Degenerative arthrosis of the left knee, primary Retained hardware status post left ACL reconstruction  POST-OPERATIVE DIAGNOSIS:  Same  PROCEDURE:  Right total knee arthroplasty using computer-assisted navigation Hardware removal (screw and bone staple)  SURGEON:  Lynwood SHAUNNA Mardee Mickey. M.D.  ASSISTANT:  Sidra Koyanagi, PA-C (present and scrubbed throughout the case, critical for assistance with exposure, retraction, instrumentation, and closure)  ANESTHESIA: spinal  ESTIMATED BLOOD LOSS: 75 mL  FLUIDS REPLACED: 800 mL of crystalloid  TOURNIQUET TIME: 142 minutes  DRAINS: 2 medium hemovac drains  SOFT TISSUE RELEASES: Anterior cruciate ligament, posterior cruciate ligament, deep medial collateral ligament, patellofemoral ligament  IMPLANTS UTILIZED: DePuy Attune size 8 posterior stabilized femoral component (cemented), size 9 rotating platform tibial component (cemented), 41 mm medialized dome patella (cemented), and a 6 mm stabilized rotating platform polyethylene insert.  INDICATIONS FOR SURGERY: COMMODORE Holt is a 73 y.o. year old male with a long history of progressive knee pain. X-rays demonstrated severe degenerative changes in tricompartmental fashion. The patient had not seen any significant improvement despite conservative nonsurgical intervention. After discussion of the risks and benefits of surgical intervention, the patient expressed understanding of the risks benefits and agree with plans for total knee arthroplasty.   The risks, benefits, and alternatives were discussed at length including but not limited to the risks of infection, bleeding, nerve injury, stiffness, blood clots, the need for revision surgery, cardiopulmonary complications, among others, and they were willing to proceed.  PROCEDURE IN DETAIL: The patient was brought into  the operating room and, after adequate spinal anesthesia was achieved, a tourniquet was placed on the patient's upper thigh. The patient's knee and leg were cleaned and prepped with alcohol and DuraPrep and draped in the usual sterile fashion. A timeout was performed as per usual protocol. The lower extremity was exsanguinated using an Esmarch, and the tourniquet was inflated to 300 mmHg. An anterior longitudinal incision was made followed by a standard mid vastus approach. The deep fibers of the medial collateral ligament were elevated in a subperiosteal fashion off of the medial flare of the tibia so as to maintain a continuous soft tissue sleeve. The screw and bone staple from the previous ACL reconstruction were visualized just medial to the tibial tubercle the screw was removed without difficulty. A narrow osteotome was used to elevate the bone staple off the bone. The patella was subluxed laterally and the patellofemoral ligament was incised. Inspection of the knee demonstrated severe degenerative changes with full-thickness loss of articular cartilage. Osteophytes were debrided using a rongeur. Anterior and posterior cruciate ligaments were excised. Two 4.0 mm Schanz pins were inserted in the femur and into the tibia for attachment of the array of trackers used for computer-assisted navigation. Hip center was identified using a circumduction technique. Distal landmarks were mapped using the computer. The distal femur and proximal tibia were mapped using the computer. The distal femoral cutting guide was positioned using computer-assisted navigation so as to achieve a 5 distal valgus cut. The femur was sized and it was felt that a size 8 femoral component was appropriate. A size 8 femoral cutting guide was positioned and the anterior cut was performed and verified using the computer. This was followed by completion of the posterior and chamfer cuts. Femoral cutting guide for the central box was then  positioned in the center box cut was  performed.  Attention was then directed to the proximal tibia. Medial and lateral menisci were excised. The extramedullary tibial cutting guide was positioned using computer-assisted navigation so as to achieve a 0 varus-valgus alignment and 3 posterior slope. The cut was performed and verified using the computer. The proximal tibia was sized and it was felt that a size 9 tibial tray was appropriate. Tibial and femoral trials were inserted followed by insertion of a 6 mm polyethylene insert. This allowed for excellent mediolateral soft tissue balancing both in flexion and in full extension. Finally, the patella was cut and prepared so as to accommodate a 41 mm medialized dome patella. A patella trial was placed and the knee was placed through a range of motion with excellent patellar tracking appreciated. The femoral trial was removed after debridement of posterior osteophytes. The central post-hole for the tibial component was reamed followed by insertion of a keel punch. Tibial trials were then removed. Cut surfaces of bone were irrigated with copious amounts of normal saline using pulsatile lavage and then suctioned dry. Autologous bone graft was shaped to fill the void created by the tibial tunnel and then  impacted into place. Polymethylmethacrylate cement with gentamicin was prepared in the usual fashion using a vacuum mixer. Cement was applied to the cut surface of the proximal tibia as well as along the undersurface of a size 9 rotating platform tibial component. Tibial component was positioned and impacted into place. Excess cement was removed using Personal assistant. Cement was then applied to the cut surfaces of the femur as well as along the posterior flanges of the size 8 femoral component. The femoral component was positioned and impacted into place. Excess cement was removed using Personal assistant. A 6 mm polyethylene trial was inserted and the knee was brought  into full extension with steady axial compression applied. Finally, cement was applied to the backside of a 41 mm medialized dome patella and the patellar component was positioned and patellar clamp applied. Excess cement was removed using Personal assistant. After adequate curing of the cement, the tourniquet was deflated after a total tourniquet time of 142 minutes. Hemostasis was achieved using electrocautery. The knee was irrigated with copious amounts of normal saline using pulsatile lavage followed by 450 ml of Surgiphor and then suctioned dry. 20 mL of 1.3% Exparel  and 60 mL of 0.25% Marcaine  in 40 mL of normal saline was injected along the posterior capsule, medial and lateral gutters, and along the arthrotomy site. A 6 mm stabilized rotating platform polyethylene insert was inserted and the knee was placed through a range of motion with excellent mediolateral soft tissue balancing appreciated and excellent patellar tracking noted. 2 medium drains were placed in the wound bed and brought out through separate stab incisions. The medial parapatellar portion of the incision was reapproximated using interrupted sutures of #1 Vicryl. Subcutaneous tissue was approximated in layers using first #0 Vicryl followed #2-0 Vicryl. The skin was approximated with skin staples. A sterile dressing was applied.  The patient tolerated the procedure well and was transported to the recovery room in stable condition.    Shary Lamos P. Mahir Prabhakar, Jr., M.D.

## 2024-07-11 NOTE — Anesthesia Procedure Notes (Addendum)
 Spinal  Start time: 07/11/2024 11:46 AM End time: 07/11/2024 11:51 AM Staffing Performed: resident/CRNA  Resident/CRNA: Gillermo Spruce I, CRNA Performed by: Gillermo Spruce I, CRNA Authorized by: Gillermo Spruce I, CRNA   Preanesthetic Checklist Completed: patient identified, IV checked, site marked, risks and benefits discussed, surgical consent, monitors and equipment checked, pre-op evaluation and timeout performed Spinal Block Patient position: sitting Prep: ChloraPrep Patient monitoring: heart rate Approach: midline Location: L3-4 Needle Needle type: Pencil-Tip  Needle gauge: 24 G Needle length: 9 cm Needle insertion depth: 8 cm Assessment Sensory level: T10 Events: CSF return

## 2024-07-12 ENCOUNTER — Encounter: Payer: Self-pay | Admitting: Orthopedic Surgery

## 2024-07-12 DIAGNOSIS — M1712 Unilateral primary osteoarthritis, left knee: Secondary | ICD-10-CM | POA: Diagnosis not present

## 2024-07-12 LAB — GLUCOSE, CAPILLARY
Glucose-Capillary: 174 mg/dL — ABNORMAL HIGH (ref 70–99)
Glucose-Capillary: 226 mg/dL — ABNORMAL HIGH (ref 70–99)
Glucose-Capillary: 243 mg/dL — ABNORMAL HIGH (ref 70–99)
Glucose-Capillary: 174 mg/dL — ABNORMAL HIGH (ref 70–99)

## 2024-07-12 MED ORDER — OXYCODONE HCL 5 MG PO TABS
5.0000 mg | ORAL_TABLET | ORAL | 0 refills | Status: DC | PRN
Start: 2024-07-12 — End: 2024-08-20

## 2024-07-12 MED ORDER — ACETAMINOPHEN 10 MG/ML IV SOLN
INTRAVENOUS | Status: AC
  Filled 2024-07-12: qty 100

## 2024-07-12 MED ORDER — SODIUM CHLORIDE 0.9 % IV BOLUS
500.0000 mL | Freq: Once | INTRAVENOUS | Status: AC
  Administered 2024-07-12: 500 mL via INTRAVENOUS

## 2024-07-12 MED ORDER — TRAMADOL HCL 50 MG PO TABS: 50.0000 mg | ORAL_TABLET | ORAL | 0 refills | Status: DC | PRN

## 2024-07-12 MED ORDER — METFORMIN HCL 500 MG PO TABS
ORAL_TABLET | ORAL | Status: AC
Start: 2024-07-12 — End: 2024-07-12
  Filled 2024-07-12: qty 1

## 2024-07-12 MED ORDER — ASPIRIN 81 MG PO CHEW: 81.0000 mg | CHEWABLE_TABLET | Freq: Two times a day (BID) | ORAL | Status: AC

## 2024-07-12 MED ORDER — METFORMIN HCL 500 MG PO TABS
ORAL_TABLET | ORAL | Status: AC
  Filled 2024-07-12: qty 1

## 2024-07-12 MED ORDER — CELECOXIB 200 MG PO CAPS: 200.0000 mg | ORAL_CAPSULE | Freq: Two times a day (BID) | ORAL | 1 refills | Status: AC

## 2024-07-12 NOTE — Progress Notes (Signed)
 Patient currently using a 14Fr straight catheter 7 times daily for permanent urinary retention per Urologist ongoing orders - 2023  Patient has own supplies, RN provide urinal. sanitizer and washcloths.

## 2024-07-12 NOTE — Evaluation (Signed)
 Occupational Therapy Evaluation Patient Details Name: Alan Holt MRN: [id number] DOB: Mar 22, 1951 Today's Date: 07/12/2024   History of Present Illness   Patient is s/p L TKA     Clinical Impressions Pt seen for OT evaluation this date, POD#1 from above surgery. Pt was independent/modified independent in all ADLs prior to surgery, however occasionally using RW for MOBILITY due to L knee pain and a fall several weeks ago. Pt is eager to return to PLOF with less pain and improved safety and independence. Pt currently requires minimal assist for LB dressing while in seated position due to pain and limited AROM of L knee. Pt instructed in polar care mgt, falls prevention strategies, home/routines modifications, DME/AE (reacher and sock aid) for LB bathing and dressing tasks, and compression stocking mgt (education/training provided to patient and spouse). Patient requires CGA for sit to stand transfers and for transfers between surfaces at [facility] level however patient is currently symptomatic for orthostatic hypotension following standing attempts.  BP: 84/55, HR: 55. Pt would benefit from skilled OT services including additional instruction in dressing techniques with or without assistive devices for dressing and bathing skills to support recall and carryover prior to discharge and ultimately to maximize safety, independence, and minimize falls risk and caregiver burden.        If plan is discharge home, recommend the following:   A little help with walking and/or transfers;A little help with bathing/dressing/bathroom;Assistance with cooking/housework     Functional Status Assessment   Patient has had a recent decline in their functional status and demonstrates the ability to make significant improvements in function in a reasonable and predictable amount of time.     Equipment Recommendations   BSC/3in1     Recommendations for Other Services         Precautions/Restrictions    Precautions Precautions: Fall Recall of Precautions/Restrictions: Intact Precaution/Restrictions Comments: ted hose Restrictions Weight Bearing Restrictions Per Provider Order: Yes LLE Weight Bearing Per Provider Order: Weight bearing as tolerated     Mobility Bed Mobility Overal bed mobility: Needs Assistance Bed Mobility: Supine to Sit     Supine to sit: Min assist, HOB elevated, Used rails          Transfers Overall transfer level: Needs assistance Equipment used: Rolling walker (2 wheels) Transfers: Sit to/from Stand Sit to Stand: Contact guard assist           General transfer comment: symptomatic for orthostatic hypotension in standing      Balance Overall balance assessment: Needs assistance Sitting-balance support: Feet supported Sitting balance-Leahy Scale: Good     Standing balance support: Bilateral upper extremity supported, During functional activity, Reliant on assistive device for balance Standing balance-Leahy Scale: Fair                             ADL either performed or assessed with clinical judgement   ADL Overall ADL's : Needs assistance/impaired Eating/Feeding: Independent       Upper Body Bathing: Modified independent   Lower Body Bathing: Moderate assistance   Upper Body Dressing : Modified independent   Lower Body Dressing: Minimal assistance Lower Body Dressing Details (indicate cue type and reason): cuing for technique Toilet Transfer: Contact guard assist;Rolling walker (2 wheels) Toilet Transfer Details (indicate cue type and reason): simulated with bed to chair transfer Toileting- Clothing Manipulation and Hygiene: Contact guard assist Toileting - Clothing Manipulation Details (indicate cue type and reason): simulated - cuing for  safety awareness   Tub/Shower Transfer Details (indicate cue type and reason): simulation note attempted due to orthostatic hypotension episode during session.   General ADL  Comments: Min A for LB ADLs, education on technique and use of AE (reacher and sock aide), symptomatic for Orthostatic hypotension     Vision Baseline Vision/History: 1 Wears glasses       Perception         Praxis         Pertinent Vitals/Pain Pain Assessment Pain Assessment: 0-10 Pain Score: 6  Faces Pain Scale: Hurts even more Pain Location: L knee when bearing weight Pain Descriptors / Indicators: Discomfort, Grimacing Pain Intervention(s): Monitored during session, RN gave pain meds during session     Extremity/Trunk Assessment Upper Extremity Assessment Upper Extremity Assessment: Generalized weakness (BUEs 4/5, ROM WFL)   Lower Extremity Assessment Lower Extremity Assessment: Defer to PT evaluation LLE Deficits / Details: Limited knee extension LLE: Unable to fully assess due to pain LLE Coordination: decreased gross motor   Cervical / Trunk Assessment Cervical / Trunk Assessment: Normal   Communication Communication Communication: No apparent difficulties Factors Affecting Communication: Hearing impaired   Cognition Arousal: Alert Behavior During Therapy: WFL for tasks assessed/performed Cognition: No apparent impairments                               Following commands: Intact       Cueing  General Comments   Cueing Techniques: Verbal cues;Tactile cues;Visual cues      Exercises Total Joint Exercises Ankle Circles/Pumps: 10 reps, Seated Other Exercises Other Exercises: Education on the role of OT, education on AE (reacher and sock aid), education on safe use of DME (RW) and benefits of 3:1 commode seat, Education on application of ted hose to patient and spouse.   Shoulder Instructions      Home Living Family/patient expects to be discharged to:: Private residence Living Arrangements: Spouse/significant other Available Help at Discharge: Family;Available 24 hours/day Type of Home: House Home Access: Level entry     Home  Layout: One level     Bathroom Shower/Tub: Producer, television/film/video: Handicapped height Bathroom Accessibility: Yes How Accessible: Accessible via walker Home Equipment: Rolling Walker (2 wheels)   Additional Comments: suction cup grab bar in shower, toilet riser      Prior Functioning/Environment Prior Level of Function : History of Falls (last six months);Independent/Modified Independent             Mobility Comments: using walker since fall and injury to knee ADLs Comments: mod I    OT Problem List: Decreased strength;Decreased coordination;Pain;Decreased activity tolerance;Decreased safety awareness;Impaired balance (sitting and/or standing);Decreased knowledge of use of DME or AE   OT Treatment/Interventions: Self-care/ADL training;Therapeutic exercise;Therapeutic activities;Energy conservation;DME and/or AE instruction;Patient/family education;Balance training      OT Goals(Current goals can be found in the care plan section)   Acute Rehab OT Goals Patient Stated Goal: Return home OT Goal Formulation: With patient Time For Goal Achievement: 07/26/24 Potential to Achieve Goals: Good ADL Goals Pt Will Perform Lower Body Bathing: with modified independence;sit to/from stand Pt Will Perform Lower Body Dressing: with modified independence;sit to/from stand Pt Will Transfer to Toilet: with modified independence;bedside commode Pt Will Perform Toileting - Clothing Manipulation and hygiene: with modified independence;sit to/from stand Pt Will Perform Tub/Shower Transfer: with supervision;3 in 1   OT Frequency:  Min 2X/week    Co-evaluation  AM-PAC OT 6 Clicks Daily Activity     Outcome Measure Help from another person eating meals?: None Help from another person taking care of personal grooming?: None Help from another person toileting, which includes using toliet, bedpan, or urinal?: A Little Help from another person bathing (including  washing, rinsing, drying)?: A Little Help from another person to put on and taking off regular upper body clothing?: None Help from another person to put on and taking off regular lower body clothing?: A Little 6 Click Score: 21   End of Session Equipment Utilized During Treatment: Gait belt;Rolling walker (2 wheels) Nurse Communication: Mobility status;Other (comment) (RN notified of OH symptoms and BP with standing attempts)  Activity Tolerance: Patient tolerated treatment well;Patient limited by pain;Treatment limited secondary to medical complications (Comment) (symptomatic for orthostatic hypotension) Patient left: in chair;with call bell/phone within reach;with nursing/sitter in room;with family/visitor present (PT present at end of session)  OT Visit Diagnosis: Repeated falls (R29.6);Muscle weakness (generalized) (M62.81)                Time: 9146-9054 OT Time Calculation (min): 52 min Charges:  OT General Charges $OT Visit: 1 Visit OT Evaluation $OT Eval Low Complexity: 1 Low  Jariyah Hackley OTR/L   Harlene LITTIE Sharps 07/12/2024, 10:23 AM

## 2024-07-12 NOTE — Care Management Obs Status (Signed)
 MEDICARE OBSERVATION STATUS NOTIFICATION   Patient Details  Name: Alan Holt MRN: [id number] Date of Birth: 22-Nov-1951   Medicare Observation Status Notification Given:  Yes    Alan Holt 07/12/2024, 11:45 AM

## 2024-07-12 NOTE — Evaluation (Signed)
 Physical Therapy Evaluation Patient Details Name: Alan Holt MRN: [id number] DOB: 1951-12-26 Today's Date: 07/12/2024  History of Present Illness  Patient is s/p L TKA  Clinical Impression  Patient received sitting edge of bed with OT present and wife. Patient is agreeable to PT assessment. Upon standing patient reports dizziness. BP checked and was low. 80/50s. Patient returned to sitting again on bed. He was able to step pivot over to recliner with cga and RW. BP remained low when re-checked. RN made aware. Patient left in recliner. He will continue to benefit from skilled PT to improve mobility and independence for return home.         If plan is discharge home, recommend the following: A little help with walking and/or transfers;A little help with bathing/dressing/bathroom;Assist for transportation   Can travel by private vehicle    yes    Equipment Recommendations None recommended by PT  Recommendations for Other Services       Functional Status Assessment Patient has had a recent decline in their functional status and demonstrates the ability to make significant improvements in function in a reasonable and predictable amount of time.     Precautions / Restrictions Precautions Precautions: Fall Recall of Precautions/Restrictions: Intact Restrictions Weight Bearing Restrictions Per Provider Order: Yes LLE Weight Bearing Per Provider Order: Weight bearing as tolerated      Mobility  Bed Mobility               General bed mobility comments: NT patient received sitting edge of bed on arrival with OT present    Transfers Overall transfer level: Needs assistance Equipment used: Rolling walker (2 wheels) Transfers: Sit to/from Stand Sit to Stand: Contact guard assist                Ambulation/Gait Ambulation/Gait assistance: Contact guard assist Gait Distance (Feet): 3 Feet Assistive device: Rolling walker (2 wheels) Gait Pattern/deviations: Step-to  pattern, Decreased step length - right, Decreased step length - left, Decreased stride length, Knee flexed in stance - left Gait velocity: decr     General Gait Details: Patient limited by dizziness and low BP, pain in left knee  Stairs            Wheelchair Mobility     Tilt Bed    Modified Rankin (Stroke Patients Only)       Balance Overall balance assessment: Needs assistance Sitting-balance support: Feet supported Sitting balance-Leahy Scale: Good     Standing balance support: Bilateral upper extremity supported, During functional activity, Reliant on assistive device for balance Standing balance-Leahy Scale: Fair                               Pertinent Vitals/Pain Pain Assessment Pain Assessment: Faces Faces Pain Scale: Hurts even more Pain Location: L knee Pain Descriptors / Indicators: Discomfort, Grimacing Pain Intervention(s): Monitored during session, Repositioned, Ice applied    Home Living Family/patient expects to be discharged to:: Private residence Living Arrangements: Spouse/significant other Available Help at Discharge: Family;Available 24 hours/day Type of Home: House Home Access: Level entry       Home Layout: One level Home Equipment: Agricultural consultant (2 wheels)      Prior Function Prior Level of Function : History of Falls (last six months);Independent/Modified Independent             Mobility Comments: using walker since fall and injury to knee ADLs Comments: mod I  Extremity/Trunk Assessment   Upper Extremity Assessment Upper Extremity Assessment: Defer to OT evaluation    Lower Extremity Assessment Lower Extremity Assessment: LLE deficits/detail LLE Deficits / Details: Limited knee extension LLE: Unable to fully assess due to pain LLE Coordination: decreased gross motor    Cervical / Trunk Assessment Cervical / Trunk Assessment: Normal  Communication   Communication Communication: No apparent  difficulties    Cognition Arousal: Alert Behavior During Therapy: WFL for tasks assessed/performed   PT - Cognitive impairments: No apparent impairments                         Following commands: Intact       Cueing Cueing Techniques: Verbal cues     General Comments      Exercises     Assessment/Plan    PT Assessment Patient needs continued PT services  PT Problem List Decreased strength;Decreased range of motion;Decreased activity tolerance;Decreased balance;Decreased mobility;Decreased knowledge of use of DME;Decreased safety awareness;Decreased skin integrity;Pain       PT Treatment Interventions DME instruction;Gait training;Stair training;Functional mobility training;Therapeutic activities;Therapeutic exercise;Balance training;Neuromuscular re-education;Patient/family education    PT Goals (Current goals can be found in the Care Plan section)  Acute Rehab PT Goals Patient Stated Goal: improve, go home PT Goal Formulation: With patient/family Time For Goal Achievement: 07/14/24 Potential to Achieve Goals: Good    Frequency BID     Co-evaluation               AM-PAC PT 6 Clicks Mobility  Outcome Measure Help needed turning from your back to your side while in a flat bed without using bedrails?: A Little Help needed moving from lying on your back to sitting on the side of a flat bed without using bedrails?: A Little Help needed moving to and from a bed to a chair (including a wheelchair)?: A Little Help needed standing up from a chair using your arms (e.g., wheelchair or bedside chair)?: A Little Help needed to walk in hospital room?: A Lot Help needed climbing 3-5 steps with a railing? : A Lot 6 Click Score: 16    End of Session Equipment Utilized During Treatment: Gait belt Activity Tolerance: Other (comment) (dizziness, low BP) Patient left: in chair Nurse Communication: Mobility status;Other (comment) (low BP) PT Visit Diagnosis:  Unsteadiness on feet (R26.81);Other abnormalities of gait and mobility (R26.89);Muscle weakness (generalized) (M62.81);Difficulty in walking, not elsewhere classified (R26.2);Pain;Dizziness and giddiness (R42) Pain - Right/Left: Left Pain - part of body: Knee    Time: 9066-9051 PT Time Calculation (min) (ACUTE ONLY): 15 min   Charges:   PT Evaluation $PT Eval Moderate Complexity: 1 Mod   PT General Charges $$ ACUTE PT VISIT: 1 Visit         Levonne Carreras, PT, GCS 07/12/24,10:01 AM

## 2024-07-12 NOTE — TOC Progression Note (Signed)
 Transition of Care [facility]) - Progression Note    Patient Details  Name: SHANTA DORVIL MRN: [id number] Date of Birth: 1951-08-03  [facility] CM/SW Contact  Delphine KANDICE Bring, RN Phone Number: 07/12/2024, 9:41 AM  Clinical Narrative:    Wife at bedside. Patient has RW at bedside. Patient is in agreement with 3 in 1 commode. Patient states that he receive some of his DME through [employer] and [employer]. Patient does self-cath at home. Patient is in agreement with getting 3 in 1 commode with [employer]. CM called Mitch Patient is aware that [facility] was arranged by the surgeon's office with [facility] prior to admission   Expected Discharge Plan: Home w Home Health Services Barriers to Discharge: No Barriers Identified  Expected Discharge Plan and Services     Post Acute Care Choice: Durable Medical Equipment Living arrangements for the past 2 months: Single Family Home Expected Discharge Date: 07/12/24                 DME Agency: [employer] Date DME Agency Contacted: 07/12/24 Time DME Agency Contacted: [phone] Representative spoke with at DME Agency: [facility] Agency:  ([facility] arranged prior to admission)         Social Determinants of Health (SDOH) Interventions SDOH Screenings   Food Insecurity: No Food Insecurity (07/11/2024)  Housing: Low Risk  (07/11/2024)  Transportation Needs: No Transportation Needs (07/11/2024)  Utilities: Not At Risk (07/11/2024)  Alcohol Screen: Low Risk  (02/23/2024)  Depression (PHQ2-9): Low Risk  (02/23/2024)  Financial Resource Strain: Low Risk  (06/15/2024)   Received from [facility]  Physical Activity: Sufficiently Active (02/23/2024)  Social Connections: Moderately Integrated (07/11/2024)  Stress: No Stress Concern Present (02/23/2024)  Tobacco Use: Low Risk  (07/11/2024)  Health Literacy: Adequate Health Literacy (02/23/2024)    Readmission Risk Interventions     No data to display

## 2024-07-12 NOTE — Progress Notes (Signed)
 Subjective: 1 Day Post-Op Procedure(s) (LRB): ARTHROPLASTY, KNEE, TOTAL, USING IMAGELESS COMPUTER-ASSISTED NAVIGATION (Left) REMOVAL, HARDWARE (Left) Patient reports pain as mild.   Patient seen in rounds with Dr. Mardee. Patient is well, and has had no acute complaints or problems Denies any CP, SOB, N/V, fevers or chills We will start therapy today.  Plan is to go Home after hospital stay.  Objective: Vital signs in last 24 hours: Temp:  [97.4 F (36.3 C)-98.2 F (36.8 C)] 97.9 F (36.6 C) (07/17 0409) Pulse Rate:  [64-86] 64 (07/17 0409) Resp:  [10-20] 18 (07/17 0409) BP: (109-145)/(68-96) 110/68 (07/17 0409) SpO2:  [93 %-98 %] 98 % (07/17 0409) Weight:  [100.1 kg] 100.1 kg (07/16 1022)  Intake/Output from previous day:  Intake/Output Summary (Last 24 hours) at 07/12/2024 0756 Last data filed at 07/12/2024 0411 Gross per 24 hour  Intake 1807.71 ml  Output 2285 ml  Net -477.29 ml    Intake/Output this shift: No intake/output data recorded.  Labs: No results for input(s): HGB in the last 72 hours. No results for input(s): WBC, RBC, HCT, PLT in the last 72 hours. No results for input(s): NA, K, CL, CO2, BUN, CREATININE, GLUCOSE, CALCIUM  in the last 72 hours. No results for input(s): LABPT, INR in the last 72 hours.  EXAM General - Patient is Alert, Appropriate, and Oriented Extremity - Neurologically intact Neurovascular intact Sensation intact distally Intact pulses distally Dorsiflexion/Plantar flexion intact No cellulitis present Compartment soft Dressing - dressing C/D/I and no drainage Motor Function - intact, moving foot and toes well on exam. JP Drain pulled without difficulty. 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    Dysplastic nevus 02/25/2014   L mid ant thigh - mild   Dysplastic nevus  02/25/2014   R med popliteal - mild   Dysplastic nevus 02/25/2014   R med knee - mild   Dysplastic nevus 12/30/2016   R pectoral 2.0 cm med to areola - mod   Dysplastic nevus 12/30/2016   L dorsum prox forearm near antecubital - mod   Elevated BP without diagnosis of hypertension 11/10/2016   History of heart murmur in childhood    History of kidney stones    Hypertension    Hypothyroidism    OSA on CPAP    Prediabetes 05/10/2018   Primary osteoarthritis of left knee    T2DM (type 2 diabetes mellitus) (HCC)    Urinary retention    a.) 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   Weight as of this encounter: 100.1 kg. Advance diet Up with therapy  Patient will continue to work with physical therapy to pass postoperative PT protocols, ROM and strengthening  Discussed with the patient continuing to utilize [employer] Care  Patient will use bone foam in 20-30 minute intervals  Patient will wear TED hose bilaterally to help prevent DVT and clot formation  Discussed the Aquacel bandage.  This bandage will stay in place 7 days postoperatively.  Can be replaced with honeycomb bandages that will be sent home with  the patient  Discussed sending the patient home with tramadol  and oxycodone  for as needed pain management.  Patient will also be sent home with Celebrex  to help with swelling and inflammation.  Patient will take an 81 mg aspirin  twice daily for DVT prophylaxis  JP drain removed without difficulty, intact  Weight-Bearing as tolerated to left leg  Patient will follow-up with [facility] in 2  weeks for staple removal and reevaluation  Fonda Koyanagi, PA-C [facility] 07/12/2024, 7:56 AM

## 2024-07-12 NOTE — Progress Notes (Signed)
 Physical Therapy Treatment Patient Details Name: Alan Holt MRN: [id number] DOB: 1951-03-04 Today's Date: 07/12/2024   History of Present Illness Patient is s/p L TKA    PT Comments  Patient received in recliner. Wife at bedside. Patient reports he is feeling better. BP prior to mobility in the 120s systolic. Patient stood with cga and ambulated 120 feet to steps. Upon arriving to steps he reports feeling light headed. BP re-taken and was 83/51. Patient attempted to ambulate up steps but due to pain and low BP he was unable to complete. Patient given a ride back to room in recliner. Patient received HEP handout and was reviewed with patient/wife. They verbalized understanding. Patient will continue to benefit from skilled PT to improve functional mobility and independence for return to PFL.       If plan is discharge home, recommend the following: A little help with walking and/or transfers;A little help with bathing/dressing/bathroom;Assist for transportation   Can travel by private vehicle      yes  Equipment Recommendations  None recommended by PT    Recommendations for Other Services       Precautions / Restrictions Precautions Precautions: Fall Recall of Precautions/Restrictions: Intact Precaution/Restrictions Comments: ted hose Restrictions Weight Bearing Restrictions Per Provider Order: Yes LLE Weight Bearing Per Provider Order: Weight bearing as tolerated     Mobility  Bed Mobility               General bed mobility comments: NT patient up in recliner    Transfers Overall transfer level: Modified independent Equipment used: Rolling walker (2 wheels) Transfers: Sit to/from Stand Sit to Stand: Supervision                Ambulation/Gait Ambulation/Gait assistance: Contact guard assist Gait Distance (Feet): 120 Feet Assistive device: Rolling walker (2 wheels) Gait Pattern/deviations: Step-to pattern, Decreased weight shift to left, Knee flexed in  stance - left Gait velocity: decr     General Gait Details: Patient limited by dizziness and low BP, pain in left knee   Stairs             Wheelchair Mobility     Tilt Bed    Modified Rankin (Stroke Patients Only)       Balance Overall balance assessment: Needs assistance Sitting-balance support: Feet supported Sitting balance-Leahy Scale: Normal     Standing balance support: Bilateral upper extremity supported, During functional activity, Reliant on assistive device for balance Standing balance-Leahy Scale: Fair                              Hotel manager: Impaired Factors Affecting Communication: Hearing impaired  Cognition Arousal: Alert Behavior During Therapy: WFL for tasks assessed/performed   PT - Cognitive impairments: No apparent impairments                         Following commands: Intact      Cueing Cueing Techniques: Verbal cues, Gestural cues, Tactile cues, Visual cues  Exercises Total Joint Exercises Goniometric ROM: 10-85 Other Exercises Other Exercises: HEP packet reviewed with patient and provided to take home.    General Comments        Pertinent Vitals/Pain Pain Assessment Pain Assessment: Faces Faces Pain Scale: Hurts even more Pain Location: L knee when bearing weight Pain Descriptors / Indicators: Discomfort, Grimacing Pain Intervention(s): Monitored during session, Repositioned, Premedicated before session, Ice applied  Home Living                          Prior Function            PT Goals (current goals can now be found in the care plan section) Acute Rehab PT Goals Patient Stated Goal: improve, go home PT Goal Formulation: With patient/family Time For Goal Achievement: 07/14/24 Potential to Achieve Goals: Good Progress towards PT goals: Progressing toward goals    Frequency    BID      PT Plan      Co-evaluation               AM-PAC PT 6 Clicks Mobility   Outcome Measure  Help needed turning from your back to your side while in a flat bed without using bedrails?: A Little Help needed moving from lying on your back to sitting on the side of a flat bed without using bedrails?: A Little Help needed moving to and from a bed to a chair (including a wheelchair)?: A Little Help needed standing up from a chair using your arms (e.g., wheelchair or bedside chair)?: A Little Help needed to walk in hospital room?: A Little Help needed climbing 3-5 steps with a railing? : A Lot 6 Click Score: 17    End of Session   Activity Tolerance: Patient limited by pain;Other (comment) (Low BP) Patient left: in chair;with call bell/phone within reach;with family/visitor present Nurse Communication: Mobility status;Other (comment) (Low BP) PT Visit Diagnosis: Unsteadiness on feet (R26.81);Other abnormalities of gait and mobility (R26.89);Muscle weakness (generalized) (M62.81);Difficulty in walking, not elsewhere classified (R26.2);Pain;Dizziness and giddiness (R42);History of falling (Z91.81) Pain - Right/Left: Left Pain - part of body: Knee     Time: 1320-1409 PT Time Calculation (min) (ACUTE ONLY): 49 min  Charges:    $Gait Training: 8-22 mins $Therapeutic Activity: 23-37 mins PT General Charges $$ ACUTE PT VISIT: 1 Visit                     Alan Holt, PT, GCS 07/12/24,2:21 PM

## 2024-07-12 NOTE — Discharge Summary (Cosign Needed)
 Physician Discharge Summary  Subjective: 2 Days Post-Op Procedure(s) (LRB): ARTHROPLASTY, KNEE, TOTAL, USING IMAGELESS COMPUTER-ASSISTED NAVIGATION (Left) REMOVAL, HARDWARE (Left) Patient reports pain as mild.   Patient seen in rounds with Dr. Mardee. Patient is well, and has had no acute complaints or problems Denies any CP, SOB, N/V, fevers or chills We will continue therapy today.  Patient is ready to go home  Physician Discharge Summary  Patient ID: Alan Holt MRN: [id number] DOB/AGE: 1951-01-26 73 y.o.  Admit date: 07/11/2024 Discharge date: 07/13/2024  Admission Diagnoses:  Discharge Diagnoses:  Principal Problem:   History of total knee arthroplasty, left   Discharged Condition: good  Hospital Course: Patient presented to the hospital on 07/11/2024 for an elective left total knee arthroplasty performed by Dr. Mardee. Patient was given 1g of TXA and 2g of Ancef  prior to the procedure. he  tolerated the procedure well without any complications. See procedural note below for details. Postoperatively, the patient did very well. he  was able to pass PT protocols on post-op day one, but did report dizziness with being up and walking, BP check showed a systolic pressure in the 80s. Patient was held over night and given a 500 cc bolus. JP drain was removed without any difficulty and was intact. he  was able to void his bladder without any difficulty. Physical exam was unremarkable. he  denies any SOB, CP, N/V, fevers or chills. Vital signs are stable. Patient is stable to discharge home.  PROCEDURE:  Right total knee arthroplasty using computer-assisted navigation Hardware removal (screw and bone staple)   SURGEON:  Lynwood SHAUNNA Mardee Mickey. M.D.   ASSISTANT:  Sidra Koyanagi, PA-C (present and scrubbed throughout the case, critical for assistance with exposure, retraction, instrumentation, and closure)   ANESTHESIA: spinal   ESTIMATED BLOOD LOSS: 75 mL   FLUIDS REPLACED: 800 mL of  crystalloid   TOURNIQUET TIME: 142 minutes   DRAINS: 2 medium hemovac drains   SOFT TISSUE RELEASES: Anterior cruciate ligament, posterior cruciate ligament, deep medial collateral ligament, patellofemoral ligament   IMPLANTS UTILIZED: DePuy Attune size 8 posterior stabilized femoral component (cemented), size 9 rotating platform tibial component (cemented), 41 mm medialized dome patella (cemented), and a 6 mm stabilized rotating platform polyethylene insert.  Treatments: 500 cc bolus  Discharge Exam: Blood pressure (!) 156/78, pulse 83, temperature 97.6 F (36.4 C), temperature source Temporal, resp. rate 18, height 6' 2 (1.88 m), weight 100.1 kg, SpO2 98%.   Disposition: Discharge disposition: 01-Home or Self Care     home   Allergies as of 07/13/2024       Reactions   Statins    Elevated CK with dark urine   Ciprofloxacin     Tendonitis    Erythromycin Rash        Medication List     TAKE these medications    Accu-Chek Guide test strip Generic drug: glucose blood USE AS DIRECTED TO MONITOR FASTING BLOODGLUCOSE ONCE DAILY AS NEEDED   Accu-Chek Guide w/Device Kit See admin instructions.   Accu-Chek Softclix Lancets lancets USE AS DIRECTED TO MONITOR FASTING BLOODGLUCOSE ONCE DAILY AS NEEDED   acetaminophen  325 MG tablet Commonly known as: TYLENOL  Take 2 tablets (650 mg total) by mouth every 6 (six) hours as needed for moderate pain, mild pain or fever.   aspirin  81 MG chewable tablet Chew 1 tablet (81 mg total) by mouth 2 (two) times daily.   cefdinir 300 MG capsule Commonly known as: OMNICEF Take 300 mg by mouth 2 (  two) times daily. For 10 days   celecoxib  200 MG capsule Commonly known as: CELEBREX  Take 1 capsule (200 mg total) by mouth 2 (two) times daily.   ezetimibe  10 MG tablet Commonly known as: ZETIA  Take 1 tablet (10 mg total) by mouth daily.   finasteride  5 MG tablet Commonly known as: PROSCAR  Take 1 tablet (5 mg total) by mouth  daily.   levothyroxine  150 MCG tablet Commonly known as: SYNTHROID  Take 150 mcg by mouth daily before breakfast.   metFORMIN  500 MG tablet Commonly known as: GLUCOPHAGE  TAKE 1 TABLET BY MOUTH TWICE DAILY WITH MEALS   multivitamin with minerals tablet Take 1 tablet by mouth daily.   OVER THE COUNTER MEDICATION D - [place]   oxyCODONE  5 MG immediate release tablet Commonly known as: Oxy IR/ROXICODONE  Take 1 tablet (5 mg total) by mouth every 4 (four) hours as needed for moderate pain (pain score 4-6) (pain score 4-6).   tamsulosin  0.4 MG Caps capsule Commonly known as: FLOMAX  Take 1 capsule (0.4 mg total) by mouth daily.   traMADol  50 MG tablet Commonly known as: ULTRAM  Take 1-2 tablets (50-100 mg total) by mouth every 4 (four) hours as needed for moderate pain (pain score 4-6).               Durable Medical Equipment  (From admission, onward)           Start     Ordered   07/11/24 1641  DME Walker rolling  Once       Question:  Patient needs a walker to treat with the following condition  Answer:  Total knee replacement status   07/11/24 1640   07/11/24 1641  DME Bedside commode  Once       Comments: Patient is not able to walk the distance required to go the bathroom, or he/she is unable to safely negotiate stairs required to access the bathroom.  A 3in1 BSC will alleviate this problem  Question:  Patient needs a bedside commode to treat with the following condition  Answer:  Total knee replacement status   07/11/24 1640            Follow-up Information     Drake Chew, PA-C Follow up on 07/26/2024.   Specialty: Orthopedic Surgery Why: at 9:45am Contact information: [address] [place] [address] [phone]         Mardee Lynwood SQUIBB, MD Follow up on 08/23/2024.   Specialty: Orthopedic Surgery Why: at 1:45pm Contact information: [address] [facility] [place] [address] [phone]                  Signed: Sidra Drake 07/13/2024, 8:10 AM   Objective: Vital signs in last 24 hours: Temp:  [97.5 F (36.4 C)-98.7 F (37.1 C)] 97.6 F (36.4 C) (07/18 0720) Pulse Rate:  [52-83] 83 (07/18 0738) Resp:  [16-18] 18 (07/18 0738) BP: (84-156)/(51-80) 156/78 (07/18 0738) SpO2:  [96 %-100 %] 98 % (07/18 0720)  Intake/Output from previous day:  Intake/Output Summary (Last 24 hours) at 07/13/2024 0810 Last data filed at 07/13/2024 0615 Gross per 24 hour  Intake 266.67 ml  Output 2520 ml  Net -2253.33 ml    Intake/Output this shift: No intake/output data recorded.  Labs: No results for input(s): HGB in the last 72 hours. No results for input(s): WBC, RBC, HCT, PLT in the last 72 hours. No results for input(s): NA, K, CL, CO2, BUN, CREATININE, GLUCOSE, CALCIUM  in  the last 72 hours. No results for input(s): LABPT, INR in the last 72 hours.  EXAM: General - Patient is Alert, Appropriate, and Oriented Extremity - Neurologically intact Neurovascular intact Sensation intact distally Intact pulses distally Dorsiflexion/Plantar flexion intact No cellulitis present Compartment soft Dressing - dressing C/D/I and no drainage Motor Function - intact, moving foot and toes well on exam. JP Drain pulled without difficulty. Intact  Assessment/Plan: 2 Days Post-Op Procedure(s) (LRB): ARTHROPLASTY, KNEE, TOTAL, USING IMAGELESS COMPUTER-ASSISTED NAVIGATION (Left) REMOVAL, HARDWARE (Left) Procedure(s) (LRB): ARTHROPLASTY, KNEE, TOTAL, USING IMAGELESS COMPUTER-ASSISTED NAVIGATION (Left) REMOVAL, HARDWARE (Left) Past Medical History:  Diagnosis Date   Allergy    BPH (benign prostatic hyperplasia)    Complicated UTIs associated with CIC used in setting of ancontracile bladder    Dysplastic nevus 02/25/2014   L mid ant thigh - mild   Dysplastic nevus 02/25/2014   R med popliteal - mild   Dysplastic nevus 02/25/2014   R med knee - mild   Dysplastic  nevus 12/30/2016   R pectoral 2.0 cm med to areola - mod   Dysplastic nevus 12/30/2016   L dorsum prox forearm near antecubital - mod   Elevated BP without diagnosis of hypertension 11/10/2016   History of heart murmur in childhood    History of kidney stones    Hypertension    Hypothyroidism    OSA on CPAP    Prediabetes 05/10/2018   Primary osteoarthritis of left knee    T2DM (type 2 diabetes mellitus) (HCC)    Urinary retention    a.) unable to generate voluntary bladder contraction on urodynamics study   Principal Problem:   History of total knee arthroplasty, left  Estimated body mass index is 28.33 kg/m as calculated from the following:   Height as of this encounter: 6' 2 (1.88 m).   Weight as of this encounter: 100.1 kg.  Patient will continue to work with physical therapy, continue to monitor pressures   Discussed with the patient continuing to utilize Polar Care   Patient will use bone foam in 20-30 minute intervals   Patient will wear TED hose bilaterally to help prevent DVT and clot formation   Discussed the Aquacel bandage.  This bandage will stay in place 7 days postoperatively.  Can be replaced with honeycomb bandages that will be sent home with the patient   Discussed sending the patient home with tramadol  and oxycodone  for as needed pain management.  Patient will also be sent home with Celebrex  to help with swelling and inflammation.  Patient will take an 81 mg aspirin  twice daily for DVT prophylaxis   JP drain removed without difficulty, intact   Weight-Bearing as tolerated to left leg   Patient will follow-up with [facility] orthopedics in 2 weeks for staple removal and reevaluation  Diet - Diabetic diet Follow up - in 2 weeks Activity - WBAT Disposition - Home Condition Upon Discharge - Good DVT Prophylaxis - Aspirin  and TED hose  Fonda CHARLENA Koyanagi, PA-C Orthopaedic Surgery 07/13/2024, 8:10 AM

## 2024-07-13 ENCOUNTER — Other Ambulatory Visit: Payer: Self-pay

## 2024-07-13 DIAGNOSIS — M1712 Unilateral primary osteoarthritis, left knee: Secondary | ICD-10-CM | POA: Diagnosis not present

## 2024-07-13 LAB — GLUCOSE, CAPILLARY
Glucose-Capillary: 209 mg/dL — ABNORMAL HIGH (ref 70–99)
Glucose-Capillary: 204 mg/dL — ABNORMAL HIGH (ref 70–99)

## 2024-07-13 MED ORDER — SODIUM CHLORIDE 0.9 % IV BOLUS
500.0000 mL | Freq: Once | INTRAVENOUS | Status: AC
  Administered 2024-07-13: 500 mL via INTRAVENOUS

## 2024-07-13 NOTE — Progress Notes (Signed)
 Physical Therapy Treatment Patient Details Name: Alan Holt MRN: [id number] DOB: 1951/06/17 Today's Date: 07/13/2024   History of Present Illness Patient is s/p L TKA    PT Comments  Patient received in recliner. BP taken prior to session with improvement and patient not symptomatic this pm. Patient is able to stand with supervision. Ambulated 200 feet with RW and cga. Ambulated up/down 4 steps with B rails and cga. Patient demonstrates improved mobility and safety this session and wants to go home.     If plan is discharge home, recommend the following: A little help with walking and/or transfers;A little help with bathing/dressing/bathroom;Assist for transportation   Can travel by private vehicle      yes  Equipment Recommendations  None recommended by PT    Recommendations for Other Services       Precautions / Restrictions Precautions Precautions: Fall Recall of Precautions/Restrictions: Intact Precaution/Restrictions Comments: ted hose Restrictions Weight Bearing Restrictions Per Provider Order: Yes LLE Weight Bearing Per Provider Order: Weight bearing as tolerated     Mobility  Bed Mobility Overal bed mobility: Needs Assistance       Supine to sit: Contact guard, HOB elevated     General bed mobility comments: Patient received in recliner    Transfers Overall transfer level: Modified independent Equipment used: Rolling walker (2 wheels) Transfers: Sit to/from Stand Sit to Stand: Modified independent (Device/Increase time)           General transfer comment: symptomatic for orthostatic hypotension in standing    Ambulation/Gait Ambulation/Gait assistance: Contact guard assist Gait Distance (Feet): 200 Feet Assistive device: Rolling walker (2 wheels) Gait Pattern/deviations: Step-to pattern, Decreased step length - right, Decreased step length - left, Knee flexed in stance - left, Decreased stride length Gait velocity: decr     General Gait  Details: patient with improved ambulation tolerance this pm. No light headedness.   Stairs             Wheelchair Mobility     Tilt Bed    Modified Rankin (Stroke Patients Only)       Balance Overall balance assessment: Needs assistance Sitting-balance support: Feet supported Sitting balance-Leahy Scale: Normal     Standing balance support: Bilateral upper extremity supported, During functional activity, Reliant on assistive device for balance Standing balance-Leahy Scale: Fair Standing balance comment: Heavy UE support on RW due to knee pain                            Communication Communication Communication: No apparent difficulties  Cognition Arousal: Alert Behavior During Therapy: WFL for tasks assessed/performed   PT - Cognitive impairments: No apparent impairments                         Following commands: Intact      Cueing Cueing Techniques: Verbal cues, Gestural cues  Exercises Total Joint Exercises Goniometric ROM: 10-90    General Comments        Pertinent Vitals/Pain Pain Assessment Pain Assessment: 0-10 Pain Score: 2  Faces Pain Scale: Hurts little more Pain Location: L knee when bearing weight Pain Descriptors / Indicators: Discomfort, Grimacing Pain Intervention(s): Monitored during session, Repositioned, Ice applied    Home Living Family/patient expects to be discharged to:: Private residence Living Arrangements: Spouse/significant other                      Prior  Function            PT Goals (current goals can now be found in the care plan section) Acute Rehab PT Goals Patient Stated Goal: improve, go home PT Goal Formulation: With patient/family Time For Goal Achievement: 07/14/24 Potential to Achieve Goals: Good Progress towards PT goals: Progressing toward goals    Frequency    BID      PT Plan      Co-evaluation              AM-PAC PT 6 Clicks Mobility   Outcome  Measure  Help needed turning from your back to your side while in a flat bed without using bedrails?: A Little Help needed moving from lying on your back to sitting on the side of a flat bed without using bedrails?: A Little Help needed moving to and from a bed to a chair (including a wheelchair)?: A Little Help needed standing up from a chair using your arms (e.g., wheelchair or bedside chair)?: A Little Help needed to walk in hospital room?: A Little Help needed climbing 3-5 steps with a railing? : A Lot 6 Click Score: 17    End of Session   Activity Tolerance: Patient tolerated treatment well Patient left: in chair;with call bell/phone within reach Nurse Communication: Mobility status PT Visit Diagnosis: Unsteadiness on feet (R26.81);Other abnormalities of gait and mobility (R26.89);Muscle weakness (generalized) (M62.81);Difficulty in walking, not elsewhere classified (R26.2);Pain;Dizziness and giddiness (R42);History of falling (Z91.81) Pain - Right/Left: Left Pain - part of body: Knee     Time: 1353-1413 PT Time Calculation (min) (ACUTE ONLY): 20 min  Charges:    $Gait Training: 8-22 mins PT General Charges $$ ACUTE PT VISIT: 1 Visit                     Elishia Kaczorowski, PT, GCS 07/13/24,3:49 PM

## 2024-07-13 NOTE — Progress Notes (Signed)
 Subjective: 2 Days Post-Op Procedure(s) (LRB): ARTHROPLASTY, KNEE, TOTAL, USING IMAGELESS COMPUTER-ASSISTED NAVIGATION (Left) REMOVAL, HARDWARE (Left) Patient reports pain as mild.   Patient seen in rounds with Dr. Mardee. Patient is well, and has had no acute complaints or problems He did have some dizziness with PT yesterday and was found to have low blood pressures. Denies any of these symptoms today Denies any CP, SOB, N/V, fevers or chills We will continue therapy today. Monitor BPs Plan is to go Home after hospital stay.  Objective: Vital signs in last 24 hours: Temp:  [97.5 F (36.4 C)-98.7 F (37.1 C)] 97.6 F (36.4 C) (07/18 0720) Pulse Rate:  [52-83] 83 (07/18 0738) Resp:  [16-18] 18 (07/18 0738) BP: (84-156)/(51-80) 156/78 (07/18 0738) SpO2:  [96 %-100 %] 98 % (07/18 0720)  Intake/Output from previous day:  Intake/Output Summary (Last 24 hours) at 07/13/2024 0812 Last data filed at 07/13/2024 0615 Gross per 24 hour  Intake 266.67 ml  Output 2520 ml  Net -2253.33 ml    Intake/Output this shift: No intake/output data recorded.  Labs: No results for input(s): HGB in the last 72 hours. No results for input(s): WBC, RBC, HCT, PLT in the last 72 hours. No results for input(s): NA, K, CL, CO2, BUN, CREATININE, GLUCOSE, CALCIUM  in the last 72 hours. No results for input(s): LABPT, INR in the last 72 hours.  EXAM General - Patient is Alert, Appropriate, and Oriented Extremity - Neurologically intact Neurovascular intact Sensation intact distally Intact pulses distally Dorsiflexion/Plantar flexion intact No cellulitis present Compartment soft Dressing - Dressing clean and intact, small amount of drainage about size of quarter at inferior portion of bandage. Motor Function - intact, moving foot and toes well on exam.  Past Medical History:  Diagnosis Date   Allergy    BPH (benign prostatic hyperplasia)    Complicated UTIs associated  with CIC used in setting of ancontracile bladder    Dysplastic nevus 02/25/2014   L mid ant thigh - mild   Dysplastic nevus 02/25/2014   R med popliteal - mild   Dysplastic nevus 02/25/2014   R med knee - mild   Dysplastic nevus 12/30/2016   R pectoral 2.0 cm med to areola - mod   Dysplastic nevus 12/30/2016   L dorsum prox forearm near antecubital - mod   Elevated BP without diagnosis of hypertension 11/10/2016   History of heart murmur in childhood    History of kidney stones    Hypertension    Hypothyroidism    OSA on CPAP    Prediabetes 05/10/2018   Primary osteoarthritis of left knee    T2DM (type 2 diabetes mellitus) (HCC)    Urinary retention    a.) unable to generate voluntary bladder contraction on urodynamics study    Assessment/Plan: 2 Days Post-Op Procedure(s) (LRB): ARTHROPLASTY, KNEE, TOTAL, USING IMAGELESS COMPUTER-ASSISTED NAVIGATION (Left) REMOVAL, HARDWARE (Left) Principal Problem:   History of total knee arthroplasty, left  Estimated body mass index is 28.33 kg/m as calculated from the following:   Height as of this encounter: 6' 2 (1.88 m).   Weight as of this encounter: 100.1 kg. Advance diet Up with therapy  Patient will continue to work with physical therapy to pass postoperative PT protocols, ROM and strengthening. Monitor BPs  Discussed with the patient continuing to utilize Polar Care  Patient will use bone foam in 20-30 minute intervals  Patient will wear TED hose bilaterally to help prevent DVT and clot formation  Discussed the Aquacel bandage.  This bandage will stay in place 7 days postoperatively.  Can be replaced with honeycomb bandages that will be sent home with the patient  Discussed sending the patient home with tramadol  and oxycodone  for as needed pain management.  Patient will also be sent home with Celebrex  to help with swelling and inflammation.  Patient will take an 81 mg aspirin  twice daily for DVT prophylaxis  JP drain  removed without difficulty, intact  Weight-Bearing as tolerated to left leg  Patient will follow-up with [facility] orthopedics in 2 weeks for staple removal and reevaluation  Fonda Koyanagi, PA-C [facility] 07/13/2024, 8:12 AM

## 2024-07-13 NOTE — Progress Notes (Signed)
 Physical Therapy Treatment Patient Details Name: Alan Holt MRN: [id number] DOB: 1951-01-01 Today's Date: 07/13/2024   History of Present Illness Patient is s/p L TKA    PT Comments  Patient received in bed, he reports he feels better, minimal pain. Reports BP was good overnight. He requires cga for getting L LE off bed. BP taken in supine, sitting and standing. Patient is symptomatic with orthostatic hypotension. Re-checked after patient sat up for about 30 minutes and he continues to be orthostatic and symptomatic. MD and RN notified.  Patient will continue to benefit from skilled PT to improve mobility, safety and independence.      If plan is discharge home, recommend the following: A little help with walking and/or transfers;A little help with bathing/dressing/bathroom;Assist for transportation   Can travel by private vehicle      yes  Equipment Recommendations  None recommended by PT    Recommendations for Other Services       Precautions / Restrictions Precautions Precautions: Fall Recall of Precautions/Restrictions: Intact Precaution/Restrictions Comments: ted hose Restrictions Weight Bearing Restrictions Per Provider Order: Yes LLE Weight Bearing Per Provider Order: Weight bearing as tolerated     Mobility  Bed Mobility Overal bed mobility: Needs Assistance       Supine to sit: Contact guard, HOB elevated     General bed mobility comments: cga to bring L LE off bed    Transfers Overall transfer level: Needs assistance Equipment used: Rolling walker (2 wheels) Transfers: Sit to/from Stand Sit to Stand: Contact guard assist           General transfer comment: symptomatic for orthostatic hypotension in standing    Ambulation/Gait Ambulation/Gait assistance: Contact guard assist Gait Distance (Feet): 3 Feet Assistive device: Rolling walker (2 wheels) Gait Pattern/deviations: Step-through pattern, Decreased step length - right, Decreased step length  - left, Knee flexed in stance - left Gait velocity: decr     General Gait Details: Patient limited by dizziness and low BP, pain in left knee, ambulated over to recliner   Stairs             Wheelchair Mobility     Tilt Bed    Modified Rankin (Stroke Patients Only)       Balance Overall balance assessment: Needs assistance Sitting-balance support: Feet supported Sitting balance-Leahy Scale: Normal     Standing balance support: Bilateral upper extremity supported, During functional activity, Reliant on assistive device for balance Standing balance-Leahy Scale: Fair                              Hotel manager: No apparent difficulties  Cognition Arousal: Alert Behavior During Therapy: WFL for tasks assessed/performed   PT - Cognitive impairments: No apparent impairments                         Following commands: Intact      Cueing Cueing Techniques: Verbal cues, Gestural cues  Exercises      General Comments        Pertinent Vitals/Pain Pain Assessment Pain Assessment: Faces Faces Pain Scale: Hurts little more Pain Location: L knee when bearing weight Pain Descriptors / Indicators: Discomfort, Grimacing Pain Intervention(s): Monitored during session, Repositioned, Ice applied    Home Living                          Prior  Function            PT Goals (current goals can now be found in the care plan section) Acute Rehab PT Goals Patient Stated Goal: improve, go home PT Goal Formulation: With patient/family Time For Goal Achievement: 07/14/24 Potential to Achieve Goals: Good Progress towards PT goals: Not progressing toward goals - comment (due to orthostatic hypotension with mobility)    Frequency    BID      PT Plan      Co-evaluation              AM-PAC PT 6 Clicks Mobility   Outcome Measure  Help needed turning from your back to your side while in a flat bed  without using bedrails?: A Little Help needed moving from lying on your back to sitting on the side of a flat bed without using bedrails?: A Little Help needed moving to and from a bed to a chair (including a wheelchair)?: A Little Help needed standing up from a chair using your arms (e.g., wheelchair or bedside chair)?: A Little Help needed to walk in hospital room?: A Little Help needed climbing 3-5 steps with a railing? : A Lot 6 Click Score: 17    End of Session   Activity Tolerance: Treatment limited secondary to medical complications (Comment);Patient limited by pain (orthostatic hypotension) Patient left: in chair;with call bell/phone within reach;with family/visitor present Nurse Communication: Mobility status;Other (comment) (orthostatic) PT Visit Diagnosis: Unsteadiness on feet (R26.81);Other abnormalities of gait and mobility (R26.89);Muscle weakness (generalized) (M62.81);Difficulty in walking, not elsewhere classified (R26.2);Pain;Dizziness and giddiness (R42);History of falling (Z91.81) Pain - Right/Left: Left Pain - part of body: Knee     Time: 9064-9041 PT Time Calculation (min) (ACUTE ONLY): 23 min  Charges:    $Therapeutic Activity: 8-22 mins PT General Charges $$ ACUTE PT VISIT: 1 Visit                     Alyze Lauf, PT, GCS 07/13/24,10:50 AM

## 2024-07-13 NOTE — Plan of Care (Signed)
   Problem: Activity: Goal: Ability to avoid complications of mobility impairment will improve Outcome: Progressing   Problem: Pain Management: Goal: Pain level will decrease with appropriate interventions Outcome: Progressing

## 2024-07-25 ENCOUNTER — Ambulatory Visit: Payer: Self-pay | Admitting: Internal Medicine

## 2024-08-01 ENCOUNTER — Other Ambulatory Visit: Payer: Self-pay | Admitting: Internal Medicine

## 2024-08-01 DIAGNOSIS — E119 Type 2 diabetes mellitus without complications: Secondary | ICD-10-CM

## 2024-08-03 NOTE — Telephone Encounter (Signed)
 Requested Prescriptions  Pending Prescriptions Disp Refills   metFORMIN  (GLUCOPHAGE ) 500 MG tablet [Pharmacy Med Name: METFORMIN  HCL 500 MG TAB] 180 tablet 0    Sig: TAKE 1 TABLET BY MOUTH TWICE DAILY WITH MEALS     Endocrinology:  Diabetes - Biguanides Failed - 08/03/2024 10:44 AM      Failed - B12 Level in normal range and within 720 days    No results found for: VITAMINB12       Failed - CBC within normal limits and completed in the last 12 months    WBC  Date Value Ref Range Status  07/04/2024 6.9 4.0 - 10.5 K/uL Final   RBC  Date Value Ref Range Status  07/04/2024 5.20 4.22 - 5.81 MIL/uL Final   Hemoglobin  Date Value Ref Range Status  07/04/2024 15.4 13.0 - 17.0 g/dL Final  93/76/7982 84.0 12.6 - 17.7 g/dL Final   HCT  Date Value Ref Range Status  07/04/2024 45.7 39.0 - 52.0 % Final   Hematocrit  Date Value Ref Range Status  06/18/2016 45.3 37.5 - 51.0 % Final   MCHC  Date Value Ref Range Status  07/04/2024 33.7 30.0 - 36.0 g/dL Final   [facility]  Date Value Ref Range Status  07/04/2024 29.6 26.0 - 34.0 pg Final   MCV  Date Value Ref Range Status  07/04/2024 87.9 80.0 - 100.0 fL Final  06/18/2016 90 79 - 97 fL Final  11/08/2014 92 80 - 100 fL Final   No results found for: PLTCOUNTKUC, LABPLAT, POCPLA RDW  Date Value Ref Range Status  07/04/2024 13.5 11.5 - 15.5 % Final  06/18/2016 13.5 12.3 - 15.4 % Final  11/08/2014 13.6 11.5 - 14.5 % Final         Passed - Cr in normal range and within 360 days    Creat  Date Value Ref Range Status  04/26/2023 0.91 0.70 - 1.28 mg/dL Final   Creatinine, Ser  Date Value Ref Range Status  07/04/2024 0.76 0.61 - 1.24 mg/dL Final   Creatinine, Urine  Date Value Ref Range Status  10/26/2023 124 20 - 320 mg/dL Final         Passed - HBA1C is between 0 and 7.9 and within 180 days    Hgb A1c MFr Bld  Date Value Ref Range Status  07/04/2024 7.1 (H) 4.8 - 5.6 % Final    Comment:    (NOTE)         Prediabetes:  5.7 - 6.4         Diabetes: >6.4         Glycemic control for adults with diabetes: <7.0          Passed - eGFR in normal range and within 360 days    GFR, Est African American  Date Value Ref Range Status  04/22/2021 91 > OR = 60 mL/min/1.31m2 Final   GFR, Est Non African American  Date Value Ref Range Status  04/22/2021 79 > OR = 60 mL/min/1.65m2 Final   GFR, Estimated  Date Value Ref Range Status  07/04/2024 >60 >60 mL/min Final    Comment:    (NOTE) Calculated using the CKD-EPI Creatinine Equation (2021)    eGFR  Date Value Ref Range Status  04/26/2023 90 > OR = 60 mL/min/1.50m2 Final         Passed - Valid encounter within last 6 months    Recent Outpatient Visits   None     Future Appointments  In 2 weeks Alan Fend, DO [facility], [facility]

## 2024-08-10 ENCOUNTER — Other Ambulatory Visit: Payer: Self-pay | Admitting: Internal Medicine

## 2024-08-10 DIAGNOSIS — E119 Type 2 diabetes mellitus without complications: Secondary | ICD-10-CM

## 2024-08-10 DIAGNOSIS — E782 Mixed hyperlipidemia: Secondary | ICD-10-CM

## 2024-08-13 NOTE — Telephone Encounter (Signed)
 Requested medications are due for refill today.  yes  Requested medications are on the active medications list.  yes  Last refill. 01/26/2024 #90 1 rf  Future visit scheduled.   yes  Notes to clinic.  Labs are expired.    Requested Prescriptions  Pending Prescriptions Disp Refills   ezetimibe  (ZETIA ) 10 MG tablet [Pharmacy Med Name: EZETIMIBE  10 MG TAB] 90 tablet 1    Sig: TAKE 1 TABLET BY MOUTH DAILY     Cardiovascular:  Antilipid - Sterol Transport Inhibitors Failed - 08/13/2024  5:11 PM      Failed - Lipid Panel in normal range within the last 12 months    Cholesterol, Total  Date Value Ref Range Status  01/12/2016 151 100 - 199 mg/dL Final   Cholesterol  Date Value Ref Range Status  04/26/2023 140 <200 mg/dL Final   LDL Cholesterol (Calc)  Date Value Ref Range Status  04/26/2023 75 mg/dL (calc) Final    Comment:    Reference range: <100 . Desirable range <100 mg/dL for primary prevention;   <70 mg/dL for patients with CHD or diabetic patients  with > or = 2 CHD risk factors. SABRA LDL-C is now calculated using the Martin-Hopkins  calculation, which is a validated novel method providing  better accuracy than the Friedewald equation in the  estimation of LDL-C.  Gladis APPLETHWAITE et al. SANDREA. 7986;689(80): 2061-2068  (http://education.[facility].com/faq/FAQ164)    HDL  Date Value Ref Range Status  04/26/2023 38 (L) > OR = 40 mg/dL Final  98/83/7982 37 (L) >39 mg/dL Final   Triglycerides  Date Value Ref Range Status  04/26/2023 175 (H) <150 mg/dL Final         Passed - AST in normal range and within 360 days    AST  Date Value Ref Range Status  07/04/2024 20 15 - 41 U/L Final         Passed - ALT in normal range and within 360 days    ALT  Date Value Ref Range Status  07/04/2024 29 0 - 44 U/L Final         Passed - Patient is not pregnant      Passed - Valid encounter within last 12 months    Recent Outpatient Visits   None     Future Appointments              In 1 week Bernardo Fend, DO [facility], [facility]

## 2024-08-20 ENCOUNTER — Encounter: Payer: Self-pay | Admitting: Internal Medicine

## 2024-08-20 ENCOUNTER — Ambulatory Visit: Admitting: Internal Medicine

## 2024-08-20 ENCOUNTER — Other Ambulatory Visit: Payer: Self-pay

## 2024-08-20 VITALS — BP 120/84 | HR 83 | Temp 97.8°F | Resp 18 | Ht 73.5 in | Wt 220.0 lb

## 2024-08-20 DIAGNOSIS — E119 Type 2 diabetes mellitus without complications: Secondary | ICD-10-CM

## 2024-08-20 DIAGNOSIS — E782 Mixed hyperlipidemia: Secondary | ICD-10-CM | POA: Diagnosis not present

## 2024-08-20 DIAGNOSIS — Z7984 Long term (current) use of oral hypoglycemic drugs: Secondary | ICD-10-CM

## 2024-08-20 DIAGNOSIS — Z125 Encounter for screening for malignant neoplasm of prostate: Secondary | ICD-10-CM

## 2024-08-20 MED ORDER — METFORMIN HCL 500 MG PO TABS: 500.0000 mg | ORAL_TABLET | Freq: Two times a day (BID) | ORAL | 1 refills | Status: AC

## 2024-08-20 MED ORDER — EZETIMIBE 10 MG PO TABS: 10.0000 mg | ORAL_TABLET | Freq: Every day | ORAL | 1 refills | Status: AC

## 2024-08-20 NOTE — Progress Notes (Signed)
 Established Patient Office Visit  Subjective   Patient ID: Alan Holt, male    DOB: Sep 25, 1951  Age: 73 y.o. MRN: [id number]  Chief Complaint  Patient presents with   Medical Management of Chronic Issues    6 month recheck    HPI   Patient is here to follow-up on chronic medical conditions.   Discussed the use of AI scribe software for clinical note transcription with the patient, who gave verbal consent to proceed.  History of Present Illness Alan Holt is a 73 year old male who presents for a follow-up visit post knee surgery.  He is six weeks post knee surgery, performed on July 16th, and has two therapy visits scheduled this week. He experiences discomfort with compression garments, particularly in warmer weather. A fall in May worsened his knee condition, leading to the knee replacement. He has not resumed his usual walking routine but is engaging in physical therapy exercises. His goal is to walk ten to twelve miles per week.  His diabetes management includes metformin  twice daily. His A1c was 7.1 prior to surgery, and his blood sugar spiked to the 330s during hospitalization due to steroid use. At home, morning blood sugar levels reach up to 180, with occasional lows around 81 in the late morning.  He takes Zetia  for cholesterol management and thyroid  medication at 5 AM, with a follow-up with his endocrinologist pending. He is on a 150 mcg dose of thyroid  medication. He is due for a PSA and cholesterol check, and his tetanus vaccine is due later this year. He has received all COVID and pneumonia vaccines.   OSA: -Compliant with CPAP, doing well   HLD: -Medications: Zetia  10 mg, history of rhabdomyolysis on statin in 2023 -Patient denies side effects and is compliant with medication on a daily basis. -Last lipid panel: Lipid Panel     Component Value Date/Time   CHOL 140 04/26/2023 0927   CHOL 151 01/12/2016 0853   TRIG 175 (H) 04/26/2023 0927   HDL 38 (L)  04/26/2023 0927   HDL 37 (L) 01/12/2016 0853   CHOLHDL 3.7 04/26/2023 0927   VLDL 27 05/03/2017 0906   LDLCALC 75 04/26/2023 0927   LABVLDL 36 01/12/2016 0853    Diabetes, Type 2: -Last A1c 7.1% 7/25 (had been inpatient after knee replacement and was taking steroids at the time) -Medications: Metformin  500 mg BID - compliant with medication and tolerating well  -Blood sugar: 81-150 since being home -Eye exam: UTD, [facility]  -Foot exam: UTD 4/24; due, will do next time due to compression stockings  -Microalbumin: UTD -Statin: No, history of intolerance  -PNA vaccine: UTD -Denies symptoms of hypoglycemia, polyuria, polydipsia, numbness extremities, foot ulcers/trauma.  -Exercise: walks 50 miles a week -Diet: eating toast, rice chex cereal, eggs and sausage for breakfast   Hypothyroidism: -Medications: Levothyroxine  150 mcg -S/p thyroidectomy in 2015, enlarged with calcifications  -Patient is compliant with the above medication (s) at the above dose and reports no medication side effects.  -Denies weight changes, cold./heat intolerance, skin changes, anxiety/palpitations  -Last TSH: Follows with endocrinology, levels normal last October, going again this fall for recheck  BPH/Bladder Outlet obstruction/hypotonic bladder: -Following with Urology at [facility], last seen 09/26/23.  He is doing self-catheterization.  Denies any dysuria, hematuria or general discomfort. 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   Bladder outlet  obstruction 03/03/2022   Hypokalemia 03/03/2022   Hypomagnesemia 03/03/2022   AKI (acute kidney injury) (HCC) 03/02/2022   Severe bilateral hydroureteronephrosis 03/02/2022   BPH (benign prostatic hyperplasia) 03/02/2022   Hypercalcemia 03/02/2022   HTN (hypertension) 03/02/2022   Hematuria 03/02/2022   Renal calculus, right, Punctate nonobstructing  03/02/2022   Abdominal mass, RLQ (right lower quadrant) 03/01/2022   Uncontrolled type 2 diabetes mellitus with hyperglycemia (HCC) 03/11/2021   Elevated hemoglobin (HCC) 03/11/2021   DM2 (diabetes mellitus, type 2) (HCC) 02/06/2021   OSA (obstructive sleep apnea) 06/01/2018   Prediabetes 05/10/2018   Increased prostate specific antigen (PSA) velocity 05/08/2018   Obesity (BMI 30.0-34.9) 05/08/2018   Mixed hyperlipidemia 08/10/2016   Low testosterone  07/20/2016   Hypothyroidism 07/09/2015   Past Medical History:  Diagnosis Date   Allergy    BPH (benign prostatic hyperplasia)    Complicated UTIs associated with CIC used in setting of ancontracile bladder    Dysplastic nevus 02/25/2014   L mid ant thigh - mild   Dysplastic nevus 02/25/2014   R med popliteal - mild   Dysplastic nevus 02/25/2014   R med knee - mild   Dysplastic nevus 12/30/2016   R pectoral 2.0 cm med to areola - mod   Dysplastic nevus 12/30/2016   L dorsum prox forearm near antecubital - mod   Elevated BP without diagnosis of hypertension 11/10/2016   History of heart murmur in childhood    History of kidney stones    Hypertension    Hypothyroidism    OSA on CPAP    Prediabetes 05/10/2018   Primary osteoarthritis of left knee    T2DM (type 2 diabetes mellitus) (HCC)    Urinary retention    a.) unable to generate voluntary bladder contraction on urodynamics study   Past Surgical History:  Procedure Laterality Date   COLONOSCOPY WITH PROPOFOL  N/A 08/19/2023   Procedure: COLONOSCOPY WITH PROPOFOL ;  Surgeon: Unk Corinn Skiff, MD;  Location: [facility] ENDOSCOPY;   Service: Gastroenterology;  Laterality: N/A;  1st case   HARDWARE REMOVAL Left 07/11/2024   Procedure: REMOVAL, HARDWARE;  Surgeon: Mardee Lynwood SQUIBB, MD;  Location: [facility] ORS;  Service: Orthopedics;  Laterality: Left;   HERNIA REPAIR     childhood   KNEE ARTHROPLASTY Left 07/11/2024   Procedure: ARTHROPLASTY, KNEE, TOTAL, USING IMAGELESS COMPUTER-ASSISTED NAVIGATION;  Surgeon: Mardee Lynwood SQUIBB, MD;  Location: [facility] ORS;  Service: Orthopedics;  Laterality: Left;   KNEE SURGERY Left 1993   MOUTH SURGERY  04/05/2018   bone graft, 3 teeth pulled   POLYPECTOMY  08/19/2023   Procedure: POLYPECTOMY;  Surgeon: Unk Corinn Skiff, MD;  Location: [facility] ENDOSCOPY;  Service: Gastroenterology;;   THYROID  SURGERY  2015   TOTAL THYROIDECTOMY Bilateral 07/15/2014   Dr. Juengel.    Social History   Tobacco Use   Smoking status: Never   Smokeless tobacco: Never  Vaping Use   Vaping status: Never Used  Substance Use Topics   Alcohol use: No    Alcohol/week: 0.0 standard drinks of alcohol   Drug use: No   Social History   Socioeconomic History   Marital status: Married    Spouse name:  MILFORD NENA NOVAK (Spouse)   Number of children: Not on file   Years of education: Not on file   Highest education level: Master's degree (e.g., MA, MS, MEng, MEd, MSW, MBA)  Occupational History    Comment: retired PE teacher  Tobacco Use   Smoking status: Never   Smokeless tobacco: Never  Vaping Use   Vaping status: Never Used  Substance and Sexual Activity   Alcohol use: No    Alcohol/week: 0.0 standard drinks of alcohol   Drug use: No   Sexual activity: Not Currently  Other Topics Concern   Not on file  Social History Narrative   Not on file   Social Drivers of Health   Financial Resource Strain: Low Risk  (08/19/2024)   Overall Financial Resource Strain (CARDIA)    Difficulty of Paying Living Expenses: Not hard at all  Food Insecurity: No Food Insecurity (08/19/2024)   Hunger Vital Sign    Worried About  Running Out of Food in the Last Year: Never true    Ran Out of Food in the Last Year: Never true  Transportation Needs: No Transportation Needs (08/19/2024)   PRAPARE - Administrator, Civil Service (Medical): No    Lack of Transportation (Non-Medical): No  Physical Activity: Sufficiently Active (08/19/2024)   Exercise Vital Sign    Days of Exercise per Week: 5 days    Minutes of Exercise per Session: 40 min  Stress: No Stress Concern Present (08/19/2024)   [employer] of Occupational Health - Occupational Stress Questionnaire    Feeling of Stress: Not at all  Social Connections: Socially Integrated (08/19/2024)   Social Connection and Isolation Panel    Frequency of Communication with Friends and Family: More than three times a week    Frequency of Social Gatherings with Friends and Family: Once a week    Attends Religious Services: More than 4 times per year    Active Member of [employer] or Organizations: Yes    Attends Engineer, structural: More than 4 times per year    Marital Status: Married  Catering manager Violence: Not At Risk (07/11/2024)   Humiliation, Afraid, Rape, and Kick questionnaire    Fear of Current or Ex-Partner: No    Emotionally Abused: No    Physically Abused: No    Sexually Abused: No   Family Status  Relation Name Status   Father  Deceased   Mother  Deceased   PGM not sure Deceased   PGF not sure Deceased   MGM not sure Deceased   MGF not sure Deceased   Brother  Deceased   Brother  Deceased   Son  Alive  No partnership data on file   Family History  Problem Relation Age of Onset   Alcohol abuse Father    Stroke Father    Diabetes Mother    Diabetes Brother    Prostate cancer Brother    Heart attack Brother    Allergies  Allergen Reactions   Statins     Elevated CK with dark urine   Ciprofloxacin      Tendonitis    Erythromycin Rash      Review of Systems  All other systems reviewed and are negative.      Objective:     BP 120/84 (Cuff Size: Large)   Pulse 83   Temp 97.8 F (36.6 C) (Oral)   Resp 18   Ht 6' 1.5 (1.867 m)   Wt 220 lb (99.8 kg)  SpO2 99%   BMI 28.63 kg/m  BP Readings from Last 3 Encounters:  08/20/24 120/84  07/13/24 (!) 156/78  07/04/24 (!) 149/80   Wt Readings from Last 3 Encounters:  08/20/24 220 lb (99.8 kg)  07/11/24 220 lb 10.9 oz (100.1 kg)  07/04/24 220 lb 10.9 oz (100.1 kg)      Physical Exam Constitutional:      Appearance: Normal appearance.  HENT:     Head: Normocephalic and atraumatic.  Eyes:     Conjunctiva/sclera: Conjunctivae normal.  Cardiovascular:     Rate and Rhythm: Normal rate and regular rhythm.  Pulmonary:     Effort: Pulmonary effort is normal.     Breath sounds: Normal breath sounds.  Musculoskeletal:     Right lower leg: No edema.     Left lower leg: No edema.  Skin:    General: Skin is warm and dry.  Neurological:     General: No focal deficit present.     Mental Status: He is alert. Mental status is at baseline.  Psychiatric:        Mood and Affect: Mood normal.        Behavior: Behavior normal.      No results found for any visits on 08/20/24.   Last CBC Lab Results  Component Value Date   WBC 6.9 07/04/2024   HGB 15.4 07/04/2024   HCT 45.7 07/04/2024   MCV 87.9 07/04/2024   MCH 29.6 07/04/2024   RDW 13.5 07/04/2024   PLT 261 07/04/2024   Last metabolic panel Lab Results  Component Value Date   GLUCOSE 156 (H) 07/04/2024   NA 139 07/04/2024   K 3.5 07/04/2024   CL 104 07/04/2024   CO2 27 07/04/2024   BUN 21 07/04/2024   CREATININE 0.76 07/04/2024   EGFR 90 04/26/2023   CALCIUM  9.2 07/04/2024   PROT 7.0 07/04/2024   ALBUMIN 4.2 07/04/2024   LABGLOB 2.2 01/12/2016   AGRATIO 2.0 01/12/2016   BILITOT 0.7 07/04/2024   ALKPHOS 71 07/04/2024   AST 20 07/04/2024   ALT 29 07/04/2024   ANIONGAP 8 07/04/2024   Last lipids Lab Results  Component Value Date   CHOL 140 04/26/2023   HDL 38 (L)  04/26/2023   LDLCALC 75 04/26/2023   TRIG 175 (H) 04/26/2023   CHOLHDL 3.7 04/26/2023   Last hemoglobin A1c Lab Results  Component Value Date   HGBA1C 7.1 (H) 07/04/2024   Last thyroid  functions No results found for: TSH, T3TOTAL, T4TOTAL, THYROIDAB Last vitamin D  Lab Results  Component Value Date   VD25OH 51 02/01/2018   Last vitamin B12 and Folate No results found for: VITAMINB12, FOLATE    The 10-year ASCVD risk score (Arnett DK, et al., 2019) is: 35.7%    Assessment & Plan:   Assessment & Plan Status post right knee replacement Six weeks post-surgery with good flexibility and progress in physical therapy. No fluid accumulation. Anticipates release from compression garments after upcoming visit with orthopedic surgeon. - Continue physical therapy. - Follow up with orthopedic surgeon Dr. Mardee on Thursday.  Type 2 diabetes mellitus, controlled Controlled with A1c of 7.1%. Blood sugar levels stabilized after steroid-induced elevation. Morning levels up to 180 mg/dL, occasional lows around 81 mg/dL in late morning. - Continue metformin  twice daily. - Perform foot exam at next visit. - Schedule follow-up in six months for A1c recheck.  Hyperlipidemia Managed with Zetia . Cholesterol levels require monitoring. - Order cholesterol lab test. - Continue Zetia . -  Send prescription refills to [facility].  General Health Maintenance Tetanus vaccine due in November. Medicare coverage limitations noted. RSV vaccine to be considered next year at age 58. - Obtain tetanus vaccine at pharmacy or health department when due in November. - Consider RSV vaccine next year.  Follow-Up Regular follow-up schedule discussed. Prefers six-month interval for A1c recheck. - Schedule follow-up appointment in six months.  - metFORMIN  (GLUCOPHAGE ) 500 MG tablet; Take 1 tablet (500 mg total) by mouth 2 (two) times daily with a meal.  Dispense: 180 tablet; Refill: 1 - Lipid  Profile - ezetimibe  (ZETIA ) 10 MG tablet; Take 1 tablet (10 mg total) by mouth daily.  Dispense: 90 tablet; Refill: 1 - PSA    Return in about 6 months (around 02/20/2025).    Sharyle Fischer, DO

## 2024-08-21 ENCOUNTER — Ambulatory Visit: Payer: Self-pay | Admitting: Internal Medicine

## 2024-08-21 LAB — PSA: PSA: 0.65 ng/mL (ref <=4.00)

## 2024-08-21 LAB — LIPID PANEL
Cholesterol: 135 mg/dL (ref <200)
HDL: 41 mg/dL (ref >=40)
LDL Cholesterol (Calc): 67 mg/dL
Non-HDL Cholesterol (Calc): 94 mg/dL (ref <130)
Total CHOL/HDL Ratio: 3.3 (calc) (ref <5.0)
Triglycerides: 194 mg/dL — ABNORMAL HIGH (ref <150)

## 2024-10-10 LAB — OPHTHALMOLOGY REPORT-SCANNED

## 2024-10-16 ENCOUNTER — Other Ambulatory Visit: Payer: Self-pay | Admitting: Emergency Medicine

## 2024-10-29 ENCOUNTER — Telehealth: Payer: Self-pay

## 2024-10-29 NOTE — Telephone Encounter (Signed)
 Copied from CRM [id number]. Topic: General - Other >> Oct 26, 2024  3:28 PM Wess RAMAN wrote: Reason for CRM: [employer] wanted to follow up on drug therapy alert for statin medication paperwork sent via fax in April and October  Callback #:  [phone]

## 2024-10-29 NOTE — Telephone Encounter (Signed)
 Spoke to [facility] and they asked has patient tried any other statins and failed. [employer] Pinky) was informed that patient has a allergy to statin

## 2024-11-02 ENCOUNTER — Ambulatory Visit: Payer: Self-pay

## 2024-11-02 NOTE — Telephone Encounter (Signed)
 FYI Only or Action Required?: FYI only for provider: going to urgent care.  Patient was last seen in primary care on 08/20/2024 by Bernardo Fend, DO.  Called Nurse Triage reporting skin injury.  Symptoms began today.  Interventions attempted: Other: cleansed and bandaged.  Symptoms are: gradually improving.  Triage Disposition: See PCP When Office is Open (Within 3 Days)  Patient/caregiver understands and will follow disposition?: Yes   Copied from CRM #[id number]. Topic: Clinical - Red Word Triage >> Nov 02, 2024 12:06 PM Winona R wrote: Pt was scratched with a rusty nail and is now worried about if he should have another Tetnus shot since his last one was 2015 >> Nov 02, 2024 12:09 PM Winona R wrote: This incident happened today and the bleeding has slowed down. On forearm  Reason for Disposition  [1] Last tetanus shot > 5 years ago AND [2] DIRTY cut  Answer Assessment - Initial Assessment Questions Additional info: Requesting appointment for tetanus booster, no appointments are available, per [facility] do not offer tetanus at this clinic. Patient will proceed to urgent care for evaluation and tetanus     1. APPEARANCE of INJURY: What does the injury look like?      Not deep, bleeding is controlled.  2. ONSET: How long ago did the injury occur?      Today  3. LOCATION: Where is the injury located?      Arm  4. SIZE: How large is the cut?      Small, lifted skin, size of pencil eraser.  5. BLEEDING: Is it bleeding now? If Yes, ask: Is it difficult to stop?      Slowed slight bleeding  6. PAIN: Is there any pain? If Yes, ask: How bad is the pain? (Scale 0-10; or none, mild, moderate, severe)     tender 7. MECHANISM: Tell me how it happened.      Sanding rusted metal planter.  8. TETANUS: When was your last tetanus booster?     2015 9. PREGNANCY: Is there any chance you are pregnant? When was your last menstrual period?  Protocols used: Cuts and  Lacerations-A-AH

## 2025-02-20 ENCOUNTER — Ambulatory Visit: Admitting: Internal Medicine

## 2025-02-28 ENCOUNTER — Ambulatory Visit: Payer: Medicare PPO
